# Patient Record
Sex: Male | Born: 1975 | Race: Black or African American | Hispanic: No | Marital: Single | Smoking: Never smoker
Health system: Southern US, Community
[De-identification: ages and names within clinical notes are randomized; demographics above are authoritative.]

## PROBLEM LIST (undated history)

## (undated) DIAGNOSIS — M1990 Unspecified osteoarthritis, unspecified site: Secondary | ICD-10-CM

## (undated) DIAGNOSIS — N390 Urinary tract infection, site not specified: Secondary | ICD-10-CM

## (undated) DIAGNOSIS — E559 Vitamin D deficiency, unspecified: Secondary | ICD-10-CM

## (undated) DIAGNOSIS — Q059 Spina bifida, unspecified: Secondary | ICD-10-CM

## (undated) DIAGNOSIS — I10 Essential (primary) hypertension: Secondary | ICD-10-CM

## (undated) DIAGNOSIS — E785 Hyperlipidemia, unspecified: Secondary | ICD-10-CM

## (undated) HISTORY — PX: SPINAL FIXATION SURGERY: SHX1055

## (undated) HISTORY — PX: HIP SURGERY: SHX245

## (undated) HISTORY — DX: Essential (primary) hypertension: I10

## (undated) HISTORY — DX: Vitamin D deficiency, unspecified: E55.9

## (undated) HISTORY — DX: Spina bifida, unspecified: Q05.9

## (undated) HISTORY — PX: FEMUR SURGERY: SHX943

## (undated) HISTORY — DX: Urinary tract infection, site not specified: N39.0

## (undated) HISTORY — PX: VENTRICULOPERITONEAL SHUNT: SHX204

## (undated) HISTORY — DX: Hyperlipidemia, unspecified: E78.5

---

## 2007-08-27 DIAGNOSIS — N390 Urinary tract infection, site not specified: Secondary | ICD-10-CM | POA: Insufficient documentation

## 2007-10-28 ENCOUNTER — Ambulatory Visit: Payer: Self-pay | Admitting: Specialist

## 2012-04-12 ENCOUNTER — Ambulatory Visit: Payer: Self-pay

## 2012-04-12 DIAGNOSIS — S72033A Displaced midcervical fracture of unspecified femur, initial encounter for closed fracture: Secondary | ICD-10-CM | POA: Diagnosis not present

## 2012-04-13 DIAGNOSIS — S72009A Fracture of unspecified part of neck of unspecified femur, initial encounter for closed fracture: Secondary | ICD-10-CM | POA: Diagnosis not present

## 2012-04-13 DIAGNOSIS — Q059 Spina bifida, unspecified: Secondary | ICD-10-CM | POA: Diagnosis not present

## 2012-04-13 DIAGNOSIS — W108XXA Fall (on) (from) other stairs and steps, initial encounter: Secondary | ICD-10-CM | POA: Diagnosis not present

## 2012-04-13 DIAGNOSIS — S72033A Displaced midcervical fracture of unspecified femur, initial encounter for closed fracture: Secondary | ICD-10-CM | POA: Diagnosis not present

## 2012-04-13 DIAGNOSIS — S79929A Unspecified injury of unspecified thigh, initial encounter: Secondary | ICD-10-CM | POA: Diagnosis not present

## 2012-04-13 DIAGNOSIS — M7989 Other specified soft tissue disorders: Secondary | ICD-10-CM | POA: Diagnosis not present

## 2012-04-13 DIAGNOSIS — Z01810 Encounter for preprocedural cardiovascular examination: Secondary | ICD-10-CM | POA: Diagnosis not present

## 2012-04-13 DIAGNOSIS — Z01818 Encounter for other preprocedural examination: Secondary | ICD-10-CM | POA: Diagnosis not present

## 2012-04-14 DIAGNOSIS — W108XXA Fall (on) (from) other stairs and steps, initial encounter: Secondary | ICD-10-CM | POA: Diagnosis not present

## 2012-04-14 DIAGNOSIS — N319 Neuromuscular dysfunction of bladder, unspecified: Secondary | ICD-10-CM | POA: Diagnosis not present

## 2012-04-14 DIAGNOSIS — I10 Essential (primary) hypertension: Secondary | ICD-10-CM | POA: Diagnosis not present

## 2012-04-14 DIAGNOSIS — B964 Proteus (mirabilis) (morganii) as the cause of diseases classified elsewhere: Secondary | ICD-10-CM | POA: Diagnosis not present

## 2012-04-14 DIAGNOSIS — IMO0002 Reserved for concepts with insufficient information to code with codable children: Secondary | ICD-10-CM | POA: Diagnosis not present

## 2012-04-14 DIAGNOSIS — Q059 Spina bifida, unspecified: Secondary | ICD-10-CM | POA: Diagnosis not present

## 2012-04-14 DIAGNOSIS — N390 Urinary tract infection, site not specified: Secondary | ICD-10-CM | POA: Diagnosis not present

## 2012-04-14 DIAGNOSIS — S72009A Fracture of unspecified part of neck of unspecified femur, initial encounter for closed fracture: Secondary | ICD-10-CM | POA: Diagnosis not present

## 2012-04-14 DIAGNOSIS — Z01810 Encounter for preprocedural cardiovascular examination: Secondary | ICD-10-CM | POA: Diagnosis not present

## 2012-04-18 DIAGNOSIS — Z466 Encounter for fitting and adjustment of urinary device: Secondary | ICD-10-CM | POA: Diagnosis not present

## 2012-04-18 DIAGNOSIS — G8911 Acute pain due to trauma: Secondary | ICD-10-CM | POA: Diagnosis not present

## 2012-04-18 DIAGNOSIS — Z792 Long term (current) use of antibiotics: Secondary | ICD-10-CM | POA: Diagnosis not present

## 2012-04-18 DIAGNOSIS — Z5189 Encounter for other specified aftercare: Secondary | ICD-10-CM | POA: Diagnosis not present

## 2012-04-18 DIAGNOSIS — S72009D Fracture of unspecified part of neck of unspecified femur, subsequent encounter for closed fracture with routine healing: Secondary | ICD-10-CM | POA: Diagnosis not present

## 2012-04-18 DIAGNOSIS — I10 Essential (primary) hypertension: Secondary | ICD-10-CM | POA: Diagnosis not present

## 2012-04-18 DIAGNOSIS — N319 Neuromuscular dysfunction of bladder, unspecified: Secondary | ICD-10-CM | POA: Diagnosis not present

## 2012-04-18 DIAGNOSIS — W108XXA Fall (on) (from) other stairs and steps, initial encounter: Secondary | ICD-10-CM | POA: Diagnosis not present

## 2012-04-18 DIAGNOSIS — N390 Urinary tract infection, site not specified: Secondary | ICD-10-CM | POA: Diagnosis not present

## 2012-04-18 DIAGNOSIS — F329 Major depressive disorder, single episode, unspecified: Secondary | ICD-10-CM | POA: Diagnosis not present

## 2012-04-18 DIAGNOSIS — Q059 Spina bifida, unspecified: Secondary | ICD-10-CM | POA: Diagnosis not present

## 2012-04-18 DIAGNOSIS — F411 Generalized anxiety disorder: Secondary | ICD-10-CM | POA: Diagnosis not present

## 2012-04-20 DIAGNOSIS — G8911 Acute pain due to trauma: Secondary | ICD-10-CM | POA: Diagnosis not present

## 2012-04-20 DIAGNOSIS — Z5189 Encounter for other specified aftercare: Secondary | ICD-10-CM | POA: Diagnosis not present

## 2012-04-20 DIAGNOSIS — Q059 Spina bifida, unspecified: Secondary | ICD-10-CM | POA: Diagnosis not present

## 2012-04-20 DIAGNOSIS — N390 Urinary tract infection, site not specified: Secondary | ICD-10-CM | POA: Diagnosis not present

## 2012-04-20 DIAGNOSIS — S72009D Fracture of unspecified part of neck of unspecified femur, subsequent encounter for closed fracture with routine healing: Secondary | ICD-10-CM | POA: Diagnosis not present

## 2012-04-20 DIAGNOSIS — I10 Essential (primary) hypertension: Secondary | ICD-10-CM | POA: Diagnosis not present

## 2012-04-22 DIAGNOSIS — I10 Essential (primary) hypertension: Secondary | ICD-10-CM | POA: Diagnosis not present

## 2012-04-22 DIAGNOSIS — S72009D Fracture of unspecified part of neck of unspecified femur, subsequent encounter for closed fracture with routine healing: Secondary | ICD-10-CM | POA: Diagnosis not present

## 2012-04-22 DIAGNOSIS — N390 Urinary tract infection, site not specified: Secondary | ICD-10-CM | POA: Diagnosis not present

## 2012-04-22 DIAGNOSIS — Q059 Spina bifida, unspecified: Secondary | ICD-10-CM | POA: Diagnosis not present

## 2012-04-22 DIAGNOSIS — G8911 Acute pain due to trauma: Secondary | ICD-10-CM | POA: Diagnosis not present

## 2012-04-22 DIAGNOSIS — Z5189 Encounter for other specified aftercare: Secondary | ICD-10-CM | POA: Diagnosis not present

## 2012-04-24 DIAGNOSIS — Z5189 Encounter for other specified aftercare: Secondary | ICD-10-CM | POA: Diagnosis not present

## 2012-04-24 DIAGNOSIS — G8911 Acute pain due to trauma: Secondary | ICD-10-CM | POA: Diagnosis not present

## 2012-04-24 DIAGNOSIS — N390 Urinary tract infection, site not specified: Secondary | ICD-10-CM | POA: Diagnosis not present

## 2012-04-24 DIAGNOSIS — Q059 Spina bifida, unspecified: Secondary | ICD-10-CM | POA: Diagnosis not present

## 2012-04-24 DIAGNOSIS — I10 Essential (primary) hypertension: Secondary | ICD-10-CM | POA: Diagnosis not present

## 2012-04-24 DIAGNOSIS — S72009D Fracture of unspecified part of neck of unspecified femur, subsequent encounter for closed fracture with routine healing: Secondary | ICD-10-CM | POA: Diagnosis not present

## 2012-04-26 DIAGNOSIS — Q059 Spina bifida, unspecified: Secondary | ICD-10-CM | POA: Diagnosis not present

## 2012-04-26 DIAGNOSIS — S72009D Fracture of unspecified part of neck of unspecified femur, subsequent encounter for closed fracture with routine healing: Secondary | ICD-10-CM | POA: Diagnosis not present

## 2012-04-26 DIAGNOSIS — N390 Urinary tract infection, site not specified: Secondary | ICD-10-CM | POA: Diagnosis not present

## 2012-04-26 DIAGNOSIS — Z5189 Encounter for other specified aftercare: Secondary | ICD-10-CM | POA: Diagnosis not present

## 2012-04-26 DIAGNOSIS — I10 Essential (primary) hypertension: Secondary | ICD-10-CM | POA: Diagnosis not present

## 2012-04-26 DIAGNOSIS — G8911 Acute pain due to trauma: Secondary | ICD-10-CM | POA: Diagnosis not present

## 2012-04-27 DIAGNOSIS — G8911 Acute pain due to trauma: Secondary | ICD-10-CM | POA: Diagnosis not present

## 2012-04-27 DIAGNOSIS — S72009D Fracture of unspecified part of neck of unspecified femur, subsequent encounter for closed fracture with routine healing: Secondary | ICD-10-CM | POA: Diagnosis not present

## 2012-04-27 DIAGNOSIS — I10 Essential (primary) hypertension: Secondary | ICD-10-CM | POA: Diagnosis not present

## 2012-04-27 DIAGNOSIS — Q059 Spina bifida, unspecified: Secondary | ICD-10-CM | POA: Diagnosis not present

## 2012-04-27 DIAGNOSIS — N390 Urinary tract infection, site not specified: Secondary | ICD-10-CM | POA: Diagnosis not present

## 2012-04-27 DIAGNOSIS — Z5189 Encounter for other specified aftercare: Secondary | ICD-10-CM | POA: Diagnosis not present

## 2012-04-28 DIAGNOSIS — Z5189 Encounter for other specified aftercare: Secondary | ICD-10-CM | POA: Diagnosis not present

## 2012-04-28 DIAGNOSIS — N390 Urinary tract infection, site not specified: Secondary | ICD-10-CM | POA: Diagnosis not present

## 2012-04-28 DIAGNOSIS — G8911 Acute pain due to trauma: Secondary | ICD-10-CM | POA: Diagnosis not present

## 2012-04-28 DIAGNOSIS — Q059 Spina bifida, unspecified: Secondary | ICD-10-CM | POA: Diagnosis not present

## 2012-04-28 DIAGNOSIS — I10 Essential (primary) hypertension: Secondary | ICD-10-CM | POA: Diagnosis not present

## 2012-04-28 DIAGNOSIS — S72009D Fracture of unspecified part of neck of unspecified femur, subsequent encounter for closed fracture with routine healing: Secondary | ICD-10-CM | POA: Diagnosis not present

## 2012-04-29 DIAGNOSIS — N390 Urinary tract infection, site not specified: Secondary | ICD-10-CM | POA: Diagnosis not present

## 2012-04-29 DIAGNOSIS — N319 Neuromuscular dysfunction of bladder, unspecified: Secondary | ICD-10-CM | POA: Diagnosis not present

## 2012-04-29 DIAGNOSIS — G43109 Migraine with aura, not intractable, without status migrainosus: Secondary | ICD-10-CM | POA: Diagnosis not present

## 2012-04-29 DIAGNOSIS — I10 Essential (primary) hypertension: Secondary | ICD-10-CM | POA: Diagnosis not present

## 2012-04-30 DIAGNOSIS — G8911 Acute pain due to trauma: Secondary | ICD-10-CM | POA: Diagnosis not present

## 2012-04-30 DIAGNOSIS — N390 Urinary tract infection, site not specified: Secondary | ICD-10-CM | POA: Diagnosis not present

## 2012-04-30 DIAGNOSIS — Z5189 Encounter for other specified aftercare: Secondary | ICD-10-CM | POA: Diagnosis not present

## 2012-04-30 DIAGNOSIS — Q059 Spina bifida, unspecified: Secondary | ICD-10-CM | POA: Diagnosis not present

## 2012-04-30 DIAGNOSIS — S72009D Fracture of unspecified part of neck of unspecified femur, subsequent encounter for closed fracture with routine healing: Secondary | ICD-10-CM | POA: Diagnosis not present

## 2012-04-30 DIAGNOSIS — I10 Essential (primary) hypertension: Secondary | ICD-10-CM | POA: Diagnosis not present

## 2012-05-05 DIAGNOSIS — Z5189 Encounter for other specified aftercare: Secondary | ICD-10-CM | POA: Diagnosis not present

## 2012-05-06 DIAGNOSIS — G8911 Acute pain due to trauma: Secondary | ICD-10-CM | POA: Diagnosis not present

## 2012-05-06 DIAGNOSIS — N390 Urinary tract infection, site not specified: Secondary | ICD-10-CM | POA: Diagnosis not present

## 2012-05-06 DIAGNOSIS — Z5189 Encounter for other specified aftercare: Secondary | ICD-10-CM | POA: Diagnosis not present

## 2012-05-06 DIAGNOSIS — I10 Essential (primary) hypertension: Secondary | ICD-10-CM | POA: Diagnosis not present

## 2012-05-06 DIAGNOSIS — Q059 Spina bifida, unspecified: Secondary | ICD-10-CM | POA: Diagnosis not present

## 2012-05-06 DIAGNOSIS — S72009D Fracture of unspecified part of neck of unspecified femur, subsequent encounter for closed fracture with routine healing: Secondary | ICD-10-CM | POA: Diagnosis not present

## 2012-06-03 DIAGNOSIS — IMO0002 Reserved for concepts with insufficient information to code with codable children: Secondary | ICD-10-CM | POA: Diagnosis not present

## 2012-06-03 DIAGNOSIS — S72033A Displaced midcervical fracture of unspecified femur, initial encounter for closed fracture: Secondary | ICD-10-CM | POA: Diagnosis not present

## 2012-06-03 DIAGNOSIS — E559 Vitamin D deficiency, unspecified: Secondary | ICD-10-CM | POA: Diagnosis not present

## 2012-06-03 DIAGNOSIS — IMO0001 Reserved for inherently not codable concepts without codable children: Secondary | ICD-10-CM | POA: Diagnosis not present

## 2012-06-03 DIAGNOSIS — I10 Essential (primary) hypertension: Secondary | ICD-10-CM | POA: Diagnosis not present

## 2012-06-03 DIAGNOSIS — N390 Urinary tract infection, site not specified: Secondary | ICD-10-CM | POA: Diagnosis not present

## 2012-11-01 DIAGNOSIS — R109 Unspecified abdominal pain: Secondary | ICD-10-CM | POA: Diagnosis not present

## 2012-11-01 DIAGNOSIS — I10 Essential (primary) hypertension: Secondary | ICD-10-CM | POA: Diagnosis not present

## 2012-11-26 ENCOUNTER — Ambulatory Visit: Payer: Self-pay

## 2012-11-26 DIAGNOSIS — R319 Hematuria, unspecified: Secondary | ICD-10-CM | POA: Diagnosis not present

## 2012-12-06 DIAGNOSIS — R3129 Other microscopic hematuria: Secondary | ICD-10-CM | POA: Diagnosis not present

## 2012-12-06 DIAGNOSIS — N329 Bladder disorder, unspecified: Secondary | ICD-10-CM | POA: Diagnosis not present

## 2012-12-06 DIAGNOSIS — E291 Testicular hypofunction: Secondary | ICD-10-CM | POA: Diagnosis not present

## 2012-12-17 DIAGNOSIS — R82998 Other abnormal findings in urine: Secondary | ICD-10-CM | POA: Diagnosis not present

## 2012-12-17 DIAGNOSIS — N319 Neuromuscular dysfunction of bladder, unspecified: Secondary | ICD-10-CM | POA: Diagnosis not present

## 2012-12-17 DIAGNOSIS — R319 Hematuria, unspecified: Secondary | ICD-10-CM | POA: Diagnosis not present

## 2012-12-17 DIAGNOSIS — R32 Unspecified urinary incontinence: Secondary | ICD-10-CM | POA: Diagnosis not present

## 2013-06-20 DIAGNOSIS — N400 Enlarged prostate without lower urinary tract symptoms: Secondary | ICD-10-CM | POA: Diagnosis not present

## 2013-06-20 DIAGNOSIS — R3129 Other microscopic hematuria: Secondary | ICD-10-CM | POA: Diagnosis not present

## 2013-06-20 DIAGNOSIS — N390 Urinary tract infection, site not specified: Secondary | ICD-10-CM | POA: Diagnosis not present

## 2013-06-20 DIAGNOSIS — N319 Neuromuscular dysfunction of bladder, unspecified: Secondary | ICD-10-CM | POA: Diagnosis not present

## 2013-07-29 DIAGNOSIS — N319 Neuromuscular dysfunction of bladder, unspecified: Secondary | ICD-10-CM | POA: Diagnosis not present

## 2013-07-29 DIAGNOSIS — N390 Urinary tract infection, site not specified: Secondary | ICD-10-CM | POA: Diagnosis not present

## 2013-07-29 DIAGNOSIS — I10 Essential (primary) hypertension: Secondary | ICD-10-CM | POA: Diagnosis not present

## 2013-07-29 DIAGNOSIS — R197 Diarrhea, unspecified: Secondary | ICD-10-CM | POA: Diagnosis not present

## 2014-03-02 DIAGNOSIS — I10 Essential (primary) hypertension: Secondary | ICD-10-CM | POA: Diagnosis not present

## 2014-03-02 DIAGNOSIS — E860 Dehydration: Secondary | ICD-10-CM | POA: Diagnosis not present

## 2014-03-02 DIAGNOSIS — N390 Urinary tract infection, site not specified: Secondary | ICD-10-CM | POA: Diagnosis not present

## 2014-03-02 DIAGNOSIS — R197 Diarrhea, unspecified: Secondary | ICD-10-CM | POA: Diagnosis not present

## 2014-03-21 DIAGNOSIS — I10 Essential (primary) hypertension: Secondary | ICD-10-CM | POA: Diagnosis not present

## 2014-03-21 DIAGNOSIS — N390 Urinary tract infection, site not specified: Secondary | ICD-10-CM | POA: Diagnosis not present

## 2014-10-02 IMAGING — CT CT ABDOMEN AND PELVIS WITHOUT AND WITH CONTRAST
2 of 4 series · 13 of 32 positions shown, 18 images · non-contrast
Comparison: none

REASON FOR EXAM: hematuria
COMMENTS:

[Series 2: wo 3.0 i40f 3 · axial · 0.82mm/px · z∈[-1128,-771]mm · 8 of 154 slices shown, 13 images]
[im 18/154  soft-tissue]
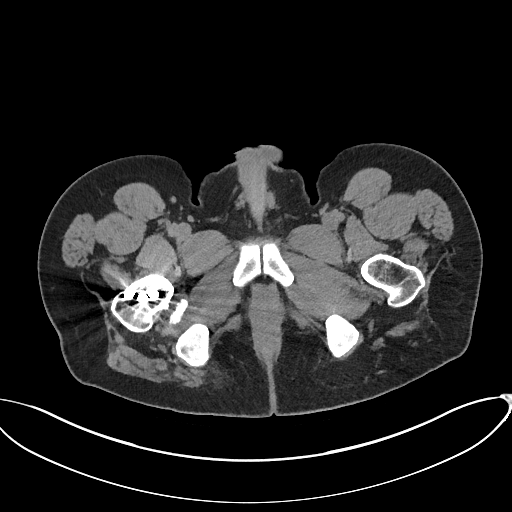
[im 18/154  bone]
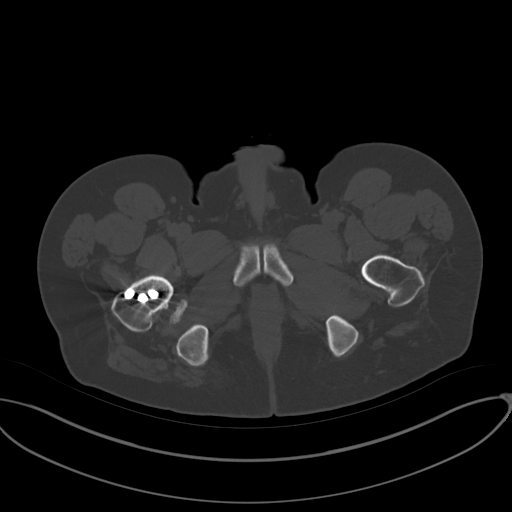
[im 35/154  soft-tissue]
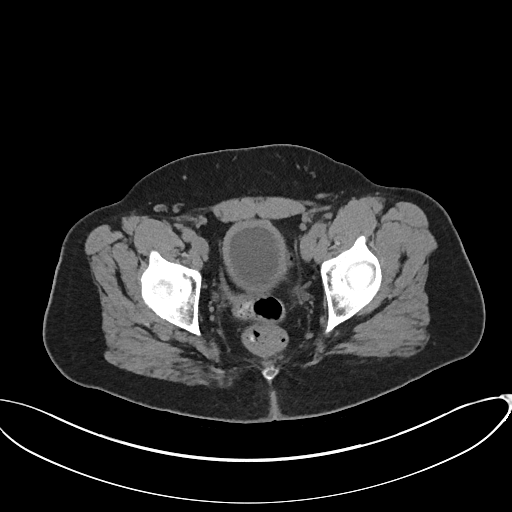
[im 52/154  soft-tissue]
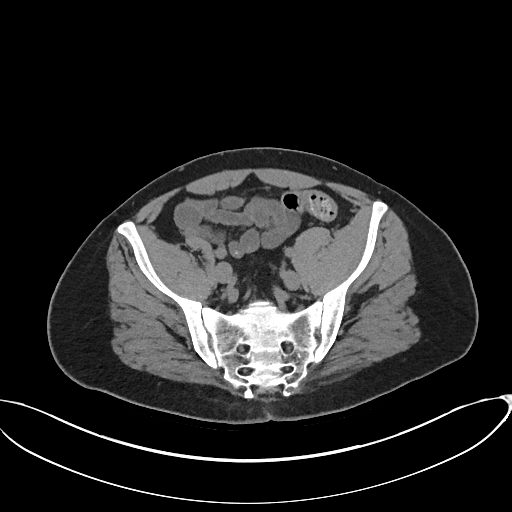
[im 69/154  soft-tissue]
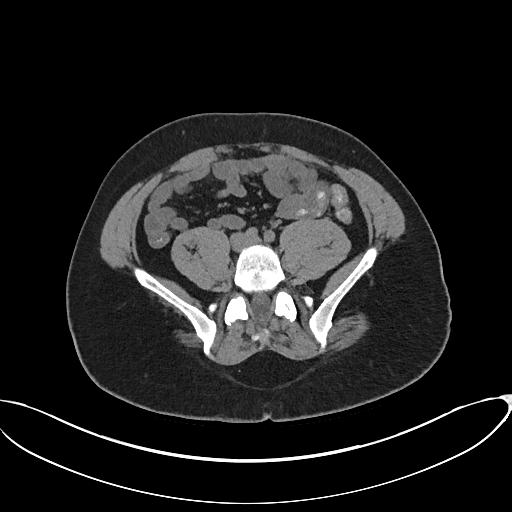
[im 86/154  soft-tissue]
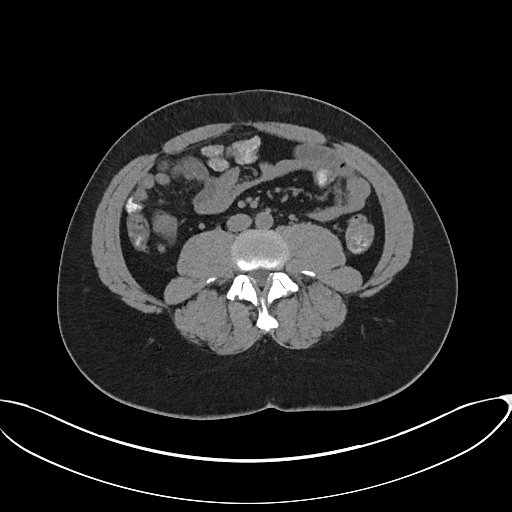
[im 86/154  lung]
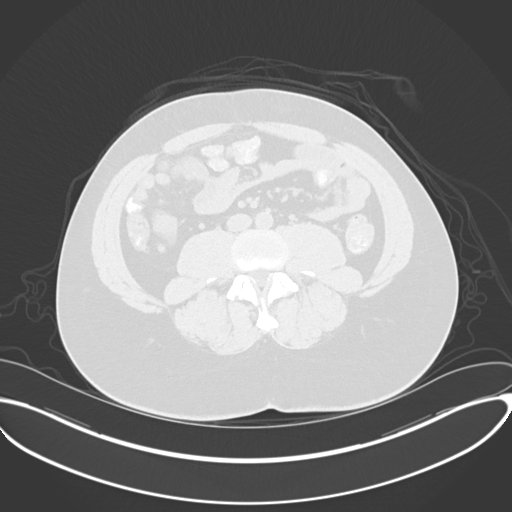
[im 103/154  soft-tissue]
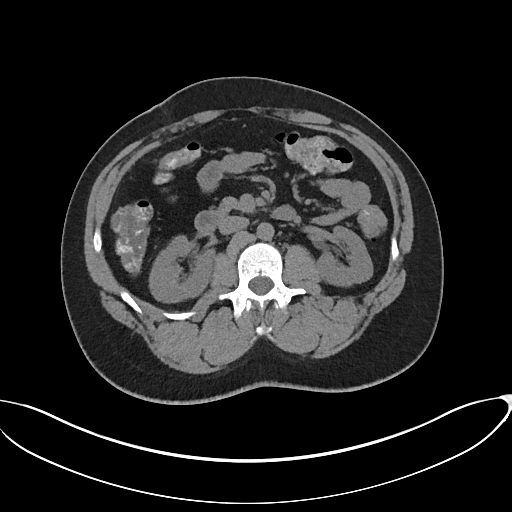
[im 103/154  lung]
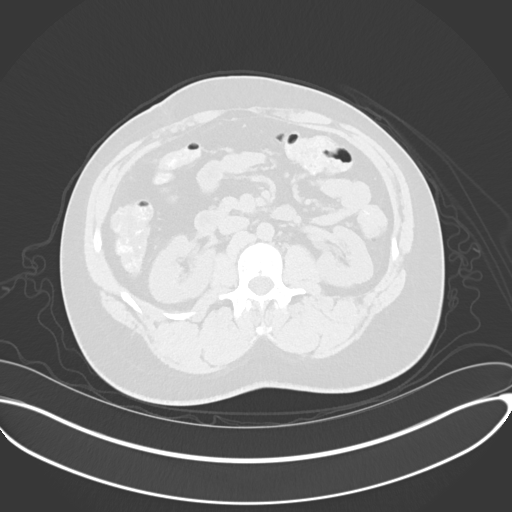
[im 120/154  soft-tissue]
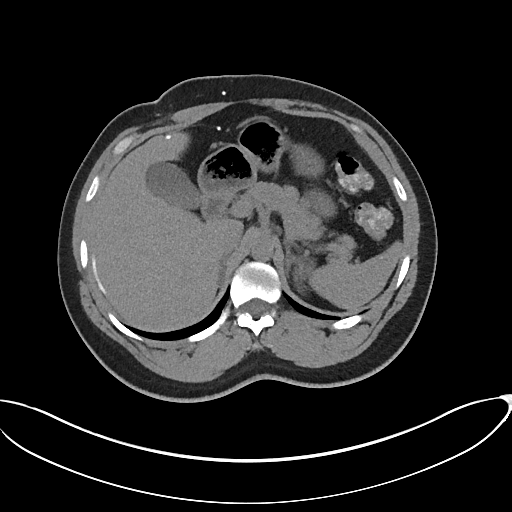
[im 120/154  lung]
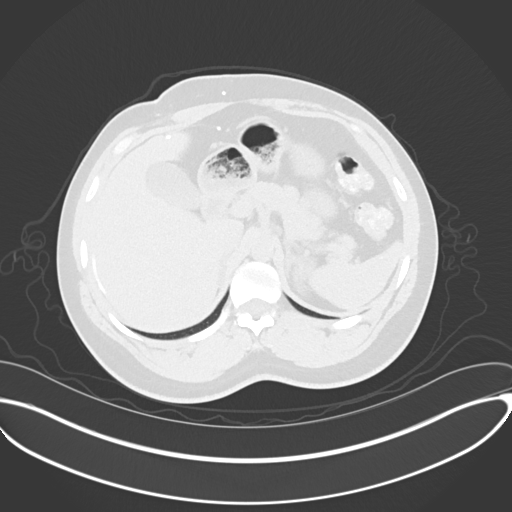
[im 137/154  soft-tissue]
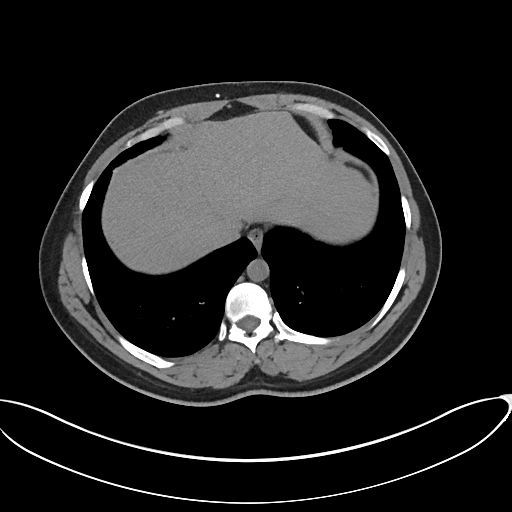
[im 137/154  lung]
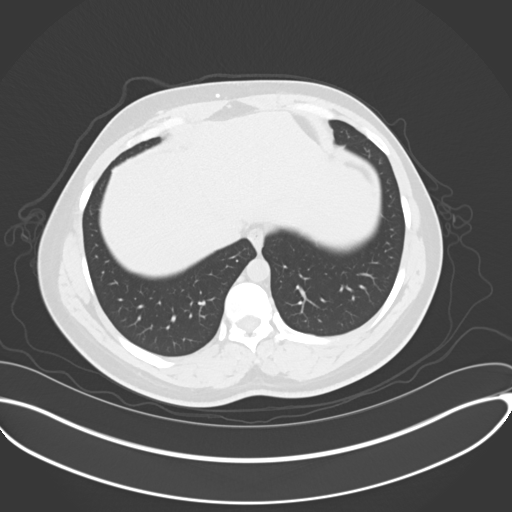

[Series 4: with 3.0 i40f 3 · axial · 0.82mm/px · z∈[-1128,-924]mm · 5 of 154 slices shown]
[im 18/154  soft-tissue]
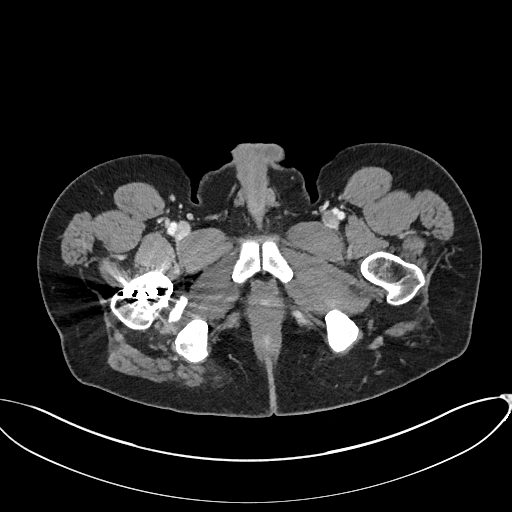
[im 35/154  soft-tissue]
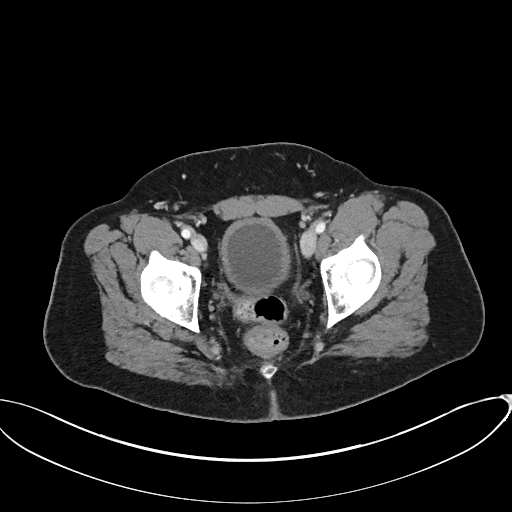
[im 52/154  soft-tissue]
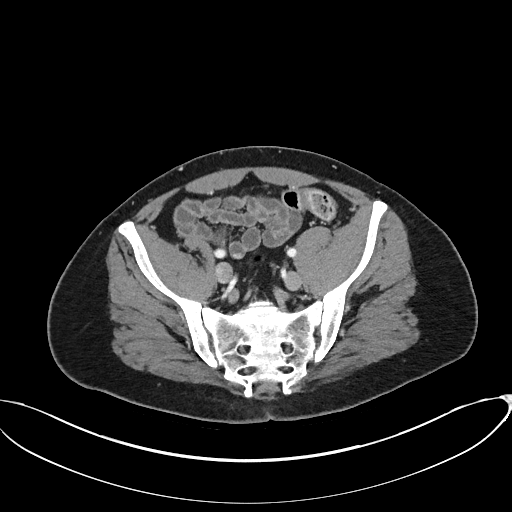
[im 69/154  soft-tissue]
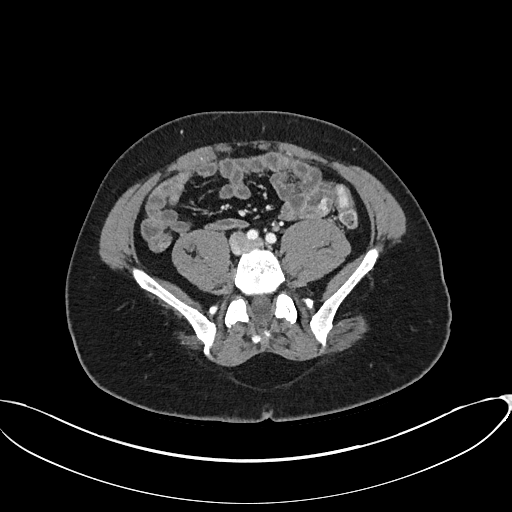
[im 86/154  soft-tissue]
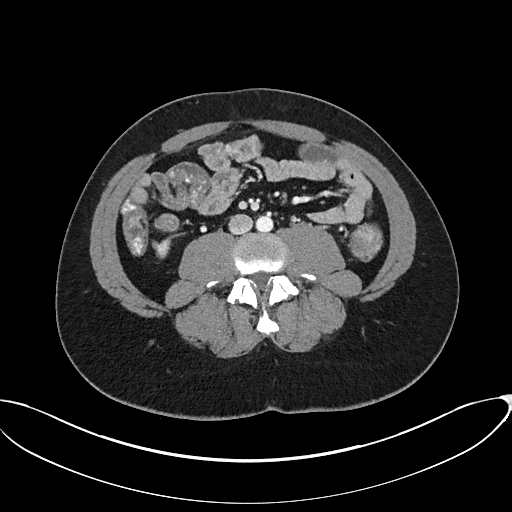

[13 of 32 positions shown; findings below may reference images not displayed]

PROCEDURE:     KCT - KCT ABDOMEN/PELVIS W/WO  - November 26, 2012 [DATE]

RESULT:     A triphasic CT scan was performed through the abdomen and pelvis
with reconstructions at 3 mm intervals and slice thicknesses. For the
postcontrast images the patient received 85 cc of Isovue 370.

On the noncontrast images the kidneys are normal in density and contour. No
calcifications are demonstrated. No perinephric inflammatory changes are
seen. Following contrast administration the enhancement pattern of the renal
parenchyma is normal. On delayed images contrast within the renal collecting
systems is normal. There is fractional visualization of the ureters. The
partially distended urinary bladder is grossly normal although there is
subjective very mild wall thickening. The patient may have undergone
previous TURP. The prostate gland is mildly enlarged.

The psoas musculature is normal in density and contour with no findings to
suggest an abscess. The liver exhibits mildly decreased density suggesting
fatty infiltration. The gallbladder is adequately distended with no evidence
of stones or wall thickening or pericholecystic fluid. The pancreas, spleen,
partially distended stomach, and adrenal glands are normal in appearance.
The caliber of the abdominal aorta is normal. The unopacified loops of small
and large bowel exhibit no acute abnormality. A normal calibered uninflamed
appendix is demonstrated.

The lung bases are clear. The lumbar vertebral bodies are preserved in
height. The patient has undergone previous beginning of the right femoral
head and neck for prior fracture.
IMPRESSION: 1. There is no evidence of urinary tract stones nor obstruction. No ureteral
or renal masses are demonstrated.
2. The urinary bladder is partially distended. Its wall is subjectively
mildly thickened. A similar finding was described on a CT scan in 3882.
There is irregularity of the opening of the prostatic urethra which may
reflect previous instrumentation. There is no evidence of prostatitis.
3. There is no acute hepatobiliary abnormality nor acute bowel abnormality.

[REDACTED]

## 2015-05-09 ENCOUNTER — Ambulatory Visit (INDEPENDENT_AMBULATORY_CARE_PROVIDER_SITE_OTHER): Payer: Medicare Other | Admitting: Family Medicine

## 2015-05-09 ENCOUNTER — Encounter: Payer: Self-pay | Admitting: Family Medicine

## 2015-05-09 VITALS — BP 128/88 | HR 134 | Temp 98.8°F | Resp 16 | Ht 63.0 in | Wt 170.1 lb

## 2015-05-09 DIAGNOSIS — I10 Essential (primary) hypertension: Secondary | ICD-10-CM

## 2015-05-09 DIAGNOSIS — N390 Urinary tract infection, site not specified: Secondary | ICD-10-CM | POA: Diagnosis not present

## 2015-05-09 DIAGNOSIS — R319 Hematuria, unspecified: Secondary | ICD-10-CM

## 2015-05-09 DIAGNOSIS — Z23 Encounter for immunization: Secondary | ICD-10-CM

## 2015-05-09 DIAGNOSIS — R61 Generalized hyperhidrosis: Secondary | ICD-10-CM | POA: Diagnosis not present

## 2015-05-09 LAB — POCT URINALYSIS DIPSTICK
Bilirubin, UA: NEGATIVE
Glucose, UA: NEGATIVE
Nitrite, UA: NEGATIVE
PH UA: 5
PROTEIN UA: NEGATIVE
RBC UA: POSITIVE
SPEC GRAV UA: 1.01
UROBILINOGEN UA: NEGATIVE

## 2015-05-09 MED ORDER — LEVOFLOXACIN 500 MG PO TABS: 500.0000 mg | ORAL_TABLET | Freq: Every day | ORAL | Status: DC

## 2015-05-09 MED ORDER — LISINOPRIL-HYDROCHLOROTHIAZIDE 20-12.5 MG PO TABS: 1.0000 | ORAL_TABLET | Freq: Every day | ORAL | Status: DC

## 2015-05-09 NOTE — Progress Notes (Signed)
Name: Juan Hodges   MRN: [id number]    DOB: Jan 17, 1976   Date:05/09/2015       Progress Note  Subjective  Chief Complaint  Chief Complaint  Patient presents with  . Cystitis    possible bladder infection    HPI  UTI  Patient presents with a several day history of dysuria frequency and urgency.  There has been suprapubic and lower back discomfort.  No fever or chills noted.  There is been no hematuria. There has  a past history of recurrent UTIs. 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 He had surgical procedure in infancy. He continues to have urinary incontinence and has to do self catheterizations 7 times per day. In the past she has been on suppressive antibiotics. Currently is on antibiotics on with flare.  Hypertension   Patient presents for follow-up of hypertension. It has been present for over 5years.  Patient states that there is compliance with medical regimen which consists of lisinopril HCTZThere is no end organ disease. Cardiac risk factors include hypertension hyperlipidemia and diabetes.  Exercise regimen consist of mental because of his spina bifida  Diet consist of some sodium restriction.    Excessive sweating  Patient complaining of several month history of excessive sweating. There's been no cough or chills and no weight loss.  Past Medical History  Diagnosis Date  . Hypertension   . Chronic UTI (urinary tract infection)     Social History  Substance Use Topics  . Smoking status: Never Smoker   . Smokeless tobacco: Not on file  . Alcohol Use: No     Current outpatient prescriptions:  .  lisinopril-hydrochlorothiazide (PRINZIDE,ZESTORETIC) 20-12.5 MG per tablet, Take 1 tablet by mouth daily., Disp: , Rfl:   No Known Allergies  Review of Systems  Constitutional: Negative for fever, chills and weight loss.  HENT: Negative for congestion, hearing loss, sore throat and  tinnitus.   Eyes: Negative for blurred vision, double vision and redness.  Respiratory: Negative for cough, hemoptysis and shortness of breath.   Cardiovascular: Negative for chest pain, palpitations, orthopnea, claudication and leg swelling.  Gastrointestinal: Negative for heartburn, nausea, vomiting, diarrhea, constipation and blood in stool.  Genitourinary: Positive for dysuria, urgency and frequency. Negative for hematuria.  Musculoskeletal: Negative for myalgias, back pain, joint pain, falls and neck pain.  Skin: Negative for itching.  Neurological: Negative for dizziness, tingling, tremors, focal weakness, seizures, loss of consciousness, weakness and headaches.  Endo/Heme/Allergies: Does not bruise/bleed easily.       Excessive sweating  Psychiatric/Behavioral: Negative for depression and substance abuse. The patient is not nervous/anxious and does not have insomnia.      Objective  Filed Vitals:   05/09/15 1036  BP: 128/88  Pulse: 134  Temp: 98.8 F (37.1 C)  Resp: 16  Height: 5\' 3"  (1.6 m)  Weight: 170 lb 1 oz (77.14 kg)  SpO2: 96%     Physical Exam  Constitutional: He is well-developed, well-nourished, and in no distress.  Modestly obese  HENT:  Head: Normocephalic.  Eyes: EOM are normal. Pupils are equal, round, and reactive to light.  Neck: Normal range of motion. Neck supple. No thyromegaly present.  Cardiovascular: Normal rate, regular rhythm and normal heart sounds.   No murmur heard. Pulmonary/Chest: Effort normal and breath sounds normal. No respiratory distress. He has no wheezes.  Abdominal: Soft. Bowel sounds are normal.  Musculoskeletal: He exhibits no  edema.  Lymphadenopathy:    He has no cervical adenopathy.  Neurological: No cranial nerve deficit. Coordination normal.  Antalgic gait.  Old surgical scar over the lower lumbar sacral area from spina bifida surgery he is somewhat spastic with his movements  Skin: Skin is warm and dry. No rash noted.   Psychiatric: Affect and judgment normal.      Assessment & Plan  1. Essential hypertension Well-controlled - Comprehensive Metabolic Panel (CMET)  2. UTI (lower urinary tract infection) Treatment levofloxacin - POCT urinalysis dipstick - levofloxacin (LEVAQUIN) 500 MG tablet; Take 1 tablet (500 mg total) by mouth daily.  Dispense: 10 tablet; Refill: 0  3. Need for influenza vaccination Refused  4. Excessive sweating  - CBC - TSH  5. Hematuria  - Urine culture

## 2015-05-10 LAB — TSH: TSH: 1.68 u[IU]/mL (ref 0.450–4.500)

## 2015-05-10 LAB — COMPREHENSIVE METABOLIC PANEL
ALT: 16 IU/L (ref 0–44)
AST: 17 IU/L (ref 0–40)
Albumin/Globulin Ratio: 1.5 (ref 1.1–2.5)
Albumin: 4.8 g/dL (ref 3.5–5.5)
Alkaline Phosphatase: 110 IU/L (ref 39–117)
BUN/Creatinine Ratio: 12 (ref 8–19)
BUN: 13 mg/dL (ref 6–20)
Bilirubin Total: 0.6 mg/dL (ref 0.0–1.2)
CALCIUM: 10.4 mg/dL — AB (ref 8.7–10.2)
CO2: 24 mmol/L (ref 18–29)
CREATININE: 1.05 mg/dL (ref 0.76–1.27)
Chloride: 98 mmol/L (ref 97–108)
GFR calc Af Amer: 103 mL/min/{1.73_m2} (ref >59)
GFR, EST NON AFRICAN AMERICAN: 89 mL/min/{1.73_m2} (ref >59)
Globulin, Total: 3.2 g/dL (ref 1.5–4.5)
Glucose: 112 mg/dL — ABNORMAL HIGH (ref 65–99)
Potassium: 4.2 mmol/L (ref 3.5–5.2)
Sodium: 141 mmol/L (ref 134–144)
Total Protein: 8 g/dL (ref 6.0–8.5)

## 2015-05-10 LAB — CBC
Hematocrit: 49.8 % (ref 37.5–51.0)
Hemoglobin: 16.9 g/dL (ref 12.6–17.7)
MCH: 28.9 pg (ref 26.6–33.0)
MCHC: 33.9 g/dL (ref 31.5–35.7)
MCV: 85 fL (ref 79–97)
PLATELETS: 309 10*3/uL (ref 150–379)
RBC: 5.85 x10E6/uL — ABNORMAL HIGH (ref 4.14–5.80)
RDW: 13.6 % (ref 12.3–15.4)
WBC: 8.5 10*3/uL (ref 3.4–10.8)

## 2015-05-10 LAB — URINE CULTURE: ORGANISM ID, BACTERIA: NO GROWTH

## 2015-05-18 ENCOUNTER — Telehealth: Payer: Self-pay | Admitting: Family Medicine

## 2015-05-18 NOTE — Telephone Encounter (Signed)
LABS RESULTS NEEDED

## 2015-05-21 NOTE — Telephone Encounter (Signed)
Pt aware of results 

## 2015-10-05 ENCOUNTER — Telehealth: Payer: Self-pay | Admitting: Family Medicine

## 2015-10-05 DIAGNOSIS — N390 Urinary tract infection, site not specified: Secondary | ICD-10-CM

## 2015-10-05 NOTE — Telephone Encounter (Signed)
NEEDS REFILL ON ANTIOBOTIC ( LEVOFLOXACIN  1 DAILY. PHARM IS [facility]. MOTHER SAYS THAT THIS IS GIVEN TO HIM ALL THE TIME. PLEASE SEE IF ANOTHER DR CAN DO THIS RX. OFFERED HER APPT WITH [facility] THAT MORNING BUT SHE HAS ANOTHER APPT FOR HERSELF.

## 2015-10-08 ENCOUNTER — Ambulatory Visit: Payer: Medicare Other | Admitting: Family Medicine

## 2015-10-08 MED ORDER — LEVOFLOXACIN 500 MG PO TABS: 500.0000 mg | ORAL_TABLET | Freq: Every day | ORAL | Status: DC

## 2015-10-08 NOTE — Telephone Encounter (Signed)
Sent refill for what was written in pt chart which was for a 10 day supply.

## 2015-11-08 ENCOUNTER — Ambulatory Visit: Payer: Medicare Other | Admitting: Family Medicine

## 2015-11-20 ENCOUNTER — Other Ambulatory Visit: Payer: Self-pay | Admitting: Family Medicine

## 2015-12-06 ENCOUNTER — Ambulatory Visit: Payer: Medicare Other | Admitting: Family Medicine

## 2015-12-24 ENCOUNTER — Ambulatory Visit: Payer: Medicare Other | Admitting: Family Medicine

## 2015-12-29 ENCOUNTER — Other Ambulatory Visit: Payer: Self-pay | Admitting: Family Medicine

## 2016-01-01 ENCOUNTER — Ambulatory Visit (INDEPENDENT_AMBULATORY_CARE_PROVIDER_SITE_OTHER): Payer: Medicare Other | Admitting: Family Medicine

## 2016-01-01 ENCOUNTER — Encounter: Payer: Self-pay | Admitting: Family Medicine

## 2016-01-01 ENCOUNTER — Ambulatory Visit: Payer: Medicare Other | Admitting: Family Medicine

## 2016-01-01 ENCOUNTER — Other Ambulatory Visit: Payer: Self-pay | Admitting: Family Medicine

## 2016-01-01 VITALS — BP 132/88 | HR 120 | Temp 98.2°F | Resp 18 | Ht 63.0 in | Wt 171.4 lb

## 2016-01-01 DIAGNOSIS — Q057 Lumbar spina bifida without hydrocephalus: Secondary | ICD-10-CM | POA: Diagnosis not present

## 2016-01-01 DIAGNOSIS — I10 Essential (primary) hypertension: Secondary | ICD-10-CM | POA: Diagnosis not present

## 2016-01-01 DIAGNOSIS — E559 Vitamin D deficiency, unspecified: Secondary | ICD-10-CM | POA: Insufficient documentation

## 2016-01-01 DIAGNOSIS — G43009 Migraine without aura, not intractable, without status migrainosus: Secondary | ICD-10-CM | POA: Diagnosis not present

## 2016-01-01 DIAGNOSIS — Z8781 Personal history of (healed) traumatic fracture: Secondary | ICD-10-CM | POA: Insufficient documentation

## 2016-01-01 DIAGNOSIS — N390 Urinary tract infection, site not specified: Secondary | ICD-10-CM | POA: Diagnosis not present

## 2016-01-01 LAB — POCT URINALYSIS DIPSTICK
BILIRUBIN UA: NEGATIVE
Glucose, UA: NEGATIVE
KETONES UA: NEGATIVE
Nitrite, UA: NEGATIVE
PH UA: 7.5
Protein, UA: NEGATIVE
SPEC GRAV UA: 1.015
Urobilinogen, UA: 1

## 2016-01-01 MED ORDER — LISINOPRIL-HYDROCHLOROTHIAZIDE 20-12.5 MG PO TABS: 1.0000 | ORAL_TABLET | Freq: Every day | ORAL | Status: DC

## 2016-01-01 MED ORDER — NORTRIPTYLINE HCL 10 MG PO CAPS: 10.0000 mg | ORAL_CAPSULE | Freq: Every day | ORAL | Status: DC

## 2016-01-01 NOTE — Progress Notes (Signed)
Name: Juan Hodges   MRN: [id number]    DOB: June 22, 1976   Date:01/01/2016       Progress Note  Subjective  Chief Complaint  Chief Complaint  Patient presents with  . Urinary Tract Infection    HPI  HTN: he has been out of bp medication for the past couple of days, he will get prescription today. BP at home is 130's/80's. He had some tachycardia this am, but he states they had to rush here today. No chest pain or palpitation. No side effects of medication  Recurrent UTI: [facility] He denies any symptoms of UTI, but would like to be checked. No fever or back pain. He has neurogenic bladder secondary to spina bifida  Migraine: he states migraine happens about three times week. Pain is described as throbbing, behind eyes, associated with phonophobia at times, but denies nausea and vomiting. He used to take Topamax but he could not tolerate side effects. He takes prn otc medication during episodes. Discussed other medications and he is willing to try something else.   Patient Active Problem List   Diagnosis Date Noted  . H/O fracture of hip 01/01/2016  . Vitamin D deficiency 01/01/2016  . 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 Hypertension, benign 01/01/2016  . Neurogenic bladder 12/19/2009  . Migraine without aura and without status migrainosus, not intractable 11/09/2009  . Chronic urinary tract infection 08/27/2007    Past Surgical History  Procedure Laterality Date  . Spinal fixation surgery      Family History  Problem Relation Age of Onset  . Hyperlipidemia Mother   . Hypertension Mother     Social History   Social History  . Marital Status: Single    Spouse Name: N/A  . Number of Children: N/A  . Years of Education: N/A   Occupational History  . Not on file.   Social History Main Topics  . Smoking status: Never Smoker   . Smokeless tobacco: Not on file  . Alcohol Use: No  . Drug Use: No  . Sexual Activity:  Not on file   Other Topics Concern  . Not on file   Social History Narrative     Current outpatient prescriptions:  .  Cholecalciferol (D3-1000) 1000 units capsule, Take by mouth., Disp: , Rfl:  .  lisinopril-hydrochlorothiazide (PRINZIDE,ZESTORETIC) 20-12.5 MG tablet, Take 1 tablet by mouth daily., Disp: 30 tablet, Rfl: 5 .  nortriptyline (PAMELOR) 10 MG capsule, Take 1-2 capsules (10-20 mg total) by mouth at bedtime., Disp: 60 capsule, Rfl: 5  No Known Allergies   ROS  Constitutional: Negative for fever or weight change.  Respiratory: Negative for cough and shortness of breath.   Cardiovascular: Negative for chest pain or palpitations.  Gastrointestinal: Negative for abdominal pain, no bowel changes.  Musculoskeletal: Positive for gait problem or joint swelling.  Skin: Negative for rash.  Neurological: Negative for dizziness or headache.  No other specific complaints in a complete review of systems (except as listed in HPI above).  Objective  Filed Vitals:   01/01/16 1058  BP: 132/88  Pulse: 120  Temp: 98.2 F (36.8 C)  Resp: 18  Height: 5\' 3"  (1.6 m)  Weight: 171 lb 6 oz (77.735 kg)  SpO2: 97%    Body mass index is 30.37 kg/(m^2).  Physical Exam  Constitutional: Patient appears well-developed and well-nourished.  No distress.  HEENT: head atraumatic, normocephalic, pupils equal and reactive to light,  neck supple, throat  within normal limits Cardiovascular: Normal rate, regular rhythm and normal heart sounds.  No murmur heard. No BLE edema. Pulmonary/Chest: Effort normal and breath sounds normal. No respiratory distress. Abdominal: Soft.  There is no tenderness. Psychiatric: Patient has a normal mood and affect. behavior is normal. Judgment and thought content normal. Muscular Skeletal: wears braces because of spina bifida. Ankle braces  Recent Results (from the past 2160 hour(s))  POCT Urinalysis Dipstick     Status: Abnormal   Collection Time: 01/01/16  11:05 AM  Result Value Ref Range   Color, UA yellow    Clarity, UA clear    Glucose, UA neg    Bilirubin, UA neg    Ketones, UA neg    Spec Grav, UA 1.015    Blood, UA moderate    pH, UA 7.5    Protein, UA neg    Urobilinogen, UA 1.0    Nitrite, UA neg    Leukocytes, UA moderate (2+) (A) Negative     PHQ2/9: Depression screen [facility] 2/9 05/09/2015  Decreased Interest 0  Down, Depressed, Hopeless 0  PHQ - 2 Score 0    Fall Risk: Fall Risk  05/09/2015  Falls in the past year? No   Functional Status Survey: Is the patient deaf or have difficulty hearing?: No Does the patient have difficulty seeing, even when wearing glasses/contacts?: No Does the patient have difficulty concentrating, remembering, or making decisions?: Yes Does the patient have difficulty walking or climbing stairs?: Yes Does the patient have difficulty dressing or bathing?: No Does the patient have difficulty doing errands alone such as visiting a doctor's office or shopping?: Yes (does not drive, mother was here with him today)    Assessment & Plan  1. Recurrent UTI  - POCT Urinalysis Dipstick - Urine culture  2. 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 Hypertension, benign  - lisinopril-hydrochlorothiazide (PRINZIDE,ZESTORETIC) 20-12.5 MG tablet; Take 1 tablet by mouth daily.  Dispense: 30 tablet; Refill: 5  4. Vitamin D deficiency  Needs to resume otc supplementation  5. Migraine without aura and without status migrainosus, not intractable  He would like to wait 6 months before follow up, advised to return sooner if medication for prevention does not work for him - nortriptyline (PAMELOR) 10 MG capsule; Take 1-2 capsules (10-20 mg total) by mouth at bedtime.  Dispense: 60 capsule; Refill: 5

## 2016-01-04 ENCOUNTER — Other Ambulatory Visit: Payer: Self-pay | Admitting: Family Medicine

## 2016-01-04 LAB — URINE CULTURE

## 2016-01-04 MED ORDER — CIPROFLOXACIN HCL 250 MG PO TABS: 250.0000 mg | ORAL_TABLET | Freq: Two times a day (BID) | ORAL | Status: DC

## 2016-01-04 MED ORDER — NITROFURANTOIN MONOHYD MACRO 100 MG PO CAPS: 100.0000 mg | ORAL_CAPSULE | Freq: Two times a day (BID) | ORAL | Status: DC

## 2016-01-04 NOTE — Progress Notes (Signed)
Patient notified

## 2016-03-18 ENCOUNTER — Encounter: Payer: Self-pay | Admitting: Family Medicine

## 2016-03-18 ENCOUNTER — Ambulatory Visit (INDEPENDENT_AMBULATORY_CARE_PROVIDER_SITE_OTHER): Payer: Medicare Other | Admitting: Family Medicine

## 2016-03-18 VITALS — BP 144/92 | HR 120 | Temp 99.0°F | Resp 18 | Ht 64.0 in | Wt 170.6 lb

## 2016-03-18 DIAGNOSIS — L8990 Pressure ulcer of unspecified site, unspecified stage: Secondary | ICD-10-CM | POA: Diagnosis not present

## 2016-03-18 DIAGNOSIS — N390 Urinary tract infection, site not specified: Secondary | ICD-10-CM | POA: Diagnosis not present

## 2016-03-18 DIAGNOSIS — I10 Essential (primary) hypertension: Secondary | ICD-10-CM

## 2016-03-18 DIAGNOSIS — G43009 Migraine without aura, not intractable, without status migrainosus: Secondary | ICD-10-CM | POA: Diagnosis not present

## 2016-03-18 LAB — POCT URINALYSIS DIPSTICK
Bilirubin, UA: NEGATIVE
Glucose, UA: NEGATIVE
Ketones, UA: NEGATIVE
Nitrite, UA: NEGATIVE
PH UA: 5
PROTEIN UA: NEGATIVE
Spec Grav, UA: >=1.03
UROBILINOGEN UA: NEGATIVE

## 2016-03-18 MED ORDER — METOPROLOL SUCCINATE ER 50 MG PO TB24: 50.0000 mg | ORAL_TABLET | Freq: Every day | ORAL | Status: DC

## 2016-03-18 MED ORDER — NORTRIPTYLINE HCL 10 MG PO CAPS: 10.0000 mg | ORAL_CAPSULE | Freq: Every day | ORAL | Status: DC

## 2016-03-18 NOTE — Progress Notes (Signed)
Name: Juan Hodges   MRN: [id number]    DOB: November 18, 1975   Date:03/18/2016       Progress Note  Subjective  Chief Complaint  Chief Complaint  Patient presents with  . Follow-up    3 month F/U  . Recurrent UTI    Patient mother wants to have his urine rechecked and patient does not feel like his symptoms ever went away.   . Wound Check    Onset- Mother noticed wound on left outer ankle area last Sunday and states it could have been there awhile. Patient states it does not bother him but the wound has been draining-Clear and brown by a stain on his socks. 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     HPI  Pressure sore: he wears a brace on left lower leg, help with ambulation and over the weekend he noticed an open sore, mil  drainage but no  redness , no fever. He cannot say if it is painful secondary to lack of sensation caused by nerve damage/spina bifida.  Recurrent UTI: he was treated with antibiotics in April, mother states currently no urinary changes such as odor or change in color. He has been afebrile.We will recheck urine culture. He self cath about 4 times daily   HTN: bp very high today, he works part time at [employer], taking medication every night, heart rate also up, we will try adding Toprol and recheck in one month. No chest pain, no palpitation   Migraine headaches: he has been taking Nortriptyline every night and headaches at most 3 times weekly, it has improved with medication, he states he is just resting during episodes. Headache is described as frontal or nuchal, sometimes the entire head. Headache is described as pounding and associated with photophobia, phonophobia and seldom with nausea. Sometimes he takes otc Excedrin migraine prn    Patient Active Problem List   Diagnosis Date Noted  . H/O fracture of hip 01/01/2016  . Vitamin D deficiency 01/01/2016  . Spina bifida aperta of  lumbar spine (HCC) 01/01/2016  . Hypertension, benign 01/01/2016  . Neurogenic bladder 12/19/2009  . Migraine without aura and without status migrainosus, not intractable 11/09/2009  . Chronic urinary tract infection 08/27/2007    Past Surgical History  Procedure Laterality Date  . Spinal fixation surgery      Family History  Problem Relation Age of Onset  . Hyperlipidemia Mother   . Hypertension Mother     Social History   Social History  . Marital Status: Single    Spouse Name: N/A  . Number of Children: N/A  . Years of Education: N/A   Occupational History  . Not on file.   Social History Main Topics  . Smoking status: Never Smoker   . Smokeless tobacco: Not on file  . Alcohol Use: No  . Drug Use: No  . Sexual Activity: Not on file   Other Topics Concern  . Not on file   Social History Narrative     Current outpatient prescriptions:  .  Cholecalciferol (D3-1000) 1000 units capsule, Take by mouth., Disp: , Rfl:  .  lisinopril-hydrochlorothiazide (PRINZIDE,ZESTORETIC) 20-12.5 MG tablet, Take 1 tablet by mouth daily., Disp: 30 tablet, Rfl: 5 .  metoprolol succinate (TOPROL-XL) 50 MG 24 hr tablet, Take 1 tablet (50 mg total) by mouth daily. Take with or immediately following a meal., Disp: 30 tablet, Rfl:  0 .  nortriptyline (PAMELOR) 10 MG capsule, Take 1-2 capsules (10-20 mg total) by mouth at bedtime., Disp: 60 capsule, Rfl: 5  No Known Allergies   ROS  Ten systems reviewed and is negative except as mentioned in HPI   Objective  Filed Vitals:   03/18/16 1438 03/18/16 1453  BP: 158/108 144/92  Pulse: 120   Temp: 99 F (37.2 C)   TempSrc: Oral   Resp: 18   Height: 5\' 4"  (1.626 m)   Weight: 170 lb 9.6 oz (77.384 kg)   SpO2: 98%     Body mass index is 29.27 kg/(m^2).  Physical Exam  Constitutional: Patient appears well-developed and well-nourished. No distress.  HEENT: head atraumatic, normocephalic, pupils equal and reactive to light, neck  supple, throat within normal limits Cardiovascular: Normal rate, regular rhythm and normal heart sounds. No murmur heard. No BLE edema. Pulmonary/Chest: Effort normal and breath sounds normal. No respiratory distress. Abdominal: Soft. There is no tenderness. Psychiatric: Patient has a normal mood and affect. behavior is normal. Judgment and thought content normal. Muscular Skeletal: wears braces because of spina bifida. Ankle braces Neurological: decrease sensation of lower extremities Skin: small open wound on left lateral lower leg, dime size area, deeper in the center   Recent Results (from the past 2160 hour(s))  POCT Urinalysis Dipstick     Status: Abnormal   Collection Time: 01/01/16 11:05 AM  Result Value Ref Range   Color, UA yellow    Clarity, UA clear    Glucose, UA neg    Bilirubin, UA neg    Ketones, UA neg    Spec Grav, UA 1.015    Blood, UA moderate    pH, UA 7.5    Protein, UA neg    Urobilinogen, UA 1.0    Nitrite, UA neg    Leukocytes, UA moderate (2+) (A) Negative  Urine culture     Status: Abnormal   Collection Time: 01/01/16 11:49 AM  Result Value Ref Range   Urine Culture, Routine Final report (A)    Urine Culture result 1 Comment (A)     Comment: Staphylococcus epidermidis Greater than 100,000 colony forming units per mL Based on resistance to oxacillin this isolate would be resistant to all currently available beta-lactam antimicrobial agents, with the exception of the newer cephalosporins with anti-MRSA activity, such as Ceftaroline    ANTIMICROBIAL SUSCEPTIBILITY Comment     Comment:       ** S = Susceptible; I = Intermediate; R = Resistant **                    P = Positive; N = Negative             MICS are expressed in micrograms per mL    Antibiotic                 RSLT#1    RSLT#2    RSLT#3    RSLT#4 Ciprofloxacin                  R Gentamicin                     S Levofloxacin                   R Linezolid                       S Nitrofurantoin  S Oxacillin                      R Penicillin                     R Quinupristin/Dalfopristin      S Rifampin                       S Tetracycline                   S Trimethoprim/Sulfa             R Vancomycin                     S   POCT Urinalysis Dipstick     Status: Abnormal   Collection Time: 03/18/16  2:46 PM  Result Value Ref Range   Color, UA dark yellow    Clarity, UA clear    Glucose, UA neg    Bilirubin, UA neg    Ketones, UA neg    Spec Grav, UA >=1.030    Blood, UA small    pH, UA 5.0    Protein, UA neg    Urobilinogen, UA negative    Nitrite, UA neg    Leukocytes, UA large (3+) (A) Negative      PHQ2/9: Depression screen [facility] 2/9 03/18/2016 05/09/2015  Decreased Interest 0 0  Down, Depressed, Hopeless 0 0  PHQ - 2 Score 0 0     Fall Risk: Fall Risk  03/18/2016 05/09/2015  Falls in the past year? No No     Functional Status Survey: Is the patient deaf or have difficulty hearing?: No Does the patient have difficulty seeing, even when wearing glasses/contacts?: No Does the patient have difficulty concentrating, remembering, or making decisions?: No Does the patient have difficulty walking or climbing stairs?: No Does the patient have difficulty dressing or bathing?: No Does the patient have difficulty doing errands alone such as visiting a doctor's office or shopping?: No    Assessment & Plan  1. Migraine without aura and without status migrainosus, not intractable  - nortriptyline (PAMELOR) 10 MG capsule; Take 1-2 capsules (10-20 mg total) by mouth at bedtime.  Dispense: 60 capsule; Refill: 5  2. Recurrent UTI  - POCT Urinalysis Dipstick - CULTURE, URINE COMPREHENSIVE  3. Uncontrolled hypertension  - metoprolol succinate (TOPROL-XL) 50 MG 24 hr tablet; Take 1 tablet (50 mg total) by mouth daily. Take with or immediately following a meal.  Dispense: 30 tablet; Refill: 0  4. Pressure sore  - Ambulatory  referral to Home Health

## 2016-03-19 ENCOUNTER — Telehealth: Payer: Self-pay | Admitting: Family Medicine

## 2016-03-19 ENCOUNTER — Other Ambulatory Visit: Payer: Self-pay | Admitting: Family Medicine

## 2016-03-19 DIAGNOSIS — L8990 Pressure ulcer of unspecified site, unspecified stage: Secondary | ICD-10-CM

## 2016-03-19 NOTE — Telephone Encounter (Signed)
Refer to wound center - he does not drive, goes by medicare transportation to work

## 2016-03-19 NOTE — Telephone Encounter (Signed)
Pts mom states that patients insurance will not pay for wound care to come out to their home. She is requesting a referral be done for pt to go to a facility. Please advise.

## 2016-03-19 NOTE — Telephone Encounter (Signed)
patient does not qualify for medicare guideline home home bound. They received the referral and when they called the patient he told them that he works every day and does not get home until 330-4p and was requesting for them to come out around 5p. Lowella Bandyikki states that patient has to be homebound in order for them to help them.  Nikki From [facility]: [phone]

## 2016-03-20 LAB — CULTURE, URINE COMPREHENSIVE
COLONY COUNT: NO GROWTH
ORGANISM ID, BACTERIA: NO GROWTH

## 2016-03-25 ENCOUNTER — Encounter: Payer: Medicare Other | Attending: Internal Medicine | Admitting: Internal Medicine

## 2016-03-25 DIAGNOSIS — Q059 Spina bifida, unspecified: Secondary | ICD-10-CM | POA: Insufficient documentation

## 2016-03-25 DIAGNOSIS — L97821 Non-pressure chronic ulcer of other part of left lower leg limited to breakdown of skin: Secondary | ICD-10-CM | POA: Insufficient documentation

## 2016-03-25 DIAGNOSIS — L89890 Pressure ulcer of other site, unstageable: Secondary | ICD-10-CM | POA: Diagnosis not present

## 2016-03-25 DIAGNOSIS — I10 Essential (primary) hypertension: Secondary | ICD-10-CM | POA: Insufficient documentation

## 2016-03-26 NOTE — Progress Notes (Signed)
Juan Hodges, Exodus L. ([id number]) Visit Report for 03/25/2016 Abuse/Suicide Risk Screen Details Patient Name: Juan Hodges, Juan L. Date of Service: 03/25/2016 8:45 AM Medical Record Patient Account Number: [id number] Number: Treating RN: Clover MealyAfful, RN, BSN, Rita 1975/09/21 (39 y.o. Other Clinician: Date of Birth/Sex: Male) Treating ROBSON, MICHAEL Primary Care Physician/Extender: Rob BuntingG Sowles, Danna HeftyKrichna Physician: Referring Physician: Liam RogersSowles, Krichna Weeks in Treatment: 0 Abuse/Suicide Risk Screen Items Answer ABUSE/SUICIDE RISK SCREEN: Has anyone close to you tried to hurt or harm you recentlyo No Do you feel uncomfortable with anyone in your familyo No Has anyone forced you do things that you didnot want to doo No Do you have any thoughts of harming yourselfo No Patient displays signs or symptoms of abuse and/or neglect. No Electronic Signature(s) Signed: 03/25/2016 4:14:50 PM By: Elpidio EricAfful, Rita BSN, RN Entered By: Elpidio EricAfful, Rita on 03/25/2016 08:47:49 Juan Hodges, Juan L. ([id number]) -------------------------------------------------------------------------------- Activities of Daily Living Details Patient Name: Juan Hodges, Juan L. Date of Service: 03/25/2016 8:45 AM Medical Record Patient Account Number: [id number] Number: Treating RN: Clover MealyAfful, RN, BSN, Rita 1975/09/21 (39 y.o. Other Clinician: Date of Birth/Sex: Male) Treating ROBSON, MICHAEL Primary Care Physician/Extender: Rob BuntingG Sowles, Danna HeftyKrichna Physician: Referring Physician: Liam RogersSowles, Krichna Weeks in Treatment: 0 Activities of Daily Living Items Answer Activities of Daily Living (Please select one for each item) Drive Automobile Need Assistance Take Medications Need Assistance Use Telephone Need Assistance Care for Appearance Need Assistance Use Toilet Need Assistance Bath / Shower Need Assistance Dress Self Need Assistance Feed Self Need Assistance Walk Need Assistance Get In / Out Bed Need Assistance Housework Need  Assistance Prepare Meals Need Assistance Handle Money Need Assistance Shop for Self Need Assistance Electronic Signature(s) Signed: 03/25/2016 4:14:50 PM By: Elpidio EricAfful, Rita BSN, RN Entered By: Elpidio EricAfful, Rita on 03/25/2016 08:48:27 Juan Hodges, Juan L. ([id number]) -------------------------------------------------------------------------------- Education Assessment Details Patient Name: Juan Hodges, Juan L. Date of Service: 03/25/2016 8:45 AM Medical Record Patient Account Number: [id number] Number: Treating RN: Clover MealyAfful, RN, BSN, Rita 1975/09/21 (39 y.o. Other Clinician: Date of Birth/Sex: Male) Treating ROBSON, MICHAEL Primary Care Physician/Extender: Rob BuntingG Sowles, Danna HeftyKrichna Physician: Referring Physician: Liam RogersSowles, Krichna Weeks in Treatment: 0 Primary Learner Assessed: Patient Learning Preferences/Education Level/Primary Language Learning Preference: Explanation Highest Education Level: High School Preferred Language: English Cognitive Barrier Assessment/Beliefs Language Barrier: No Physical Barrier Assessment Impaired Vision: No Impaired Hearing: No Decreased Hand dexterity: No Knowledge/Comprehension Assessment Knowledge Level: High Comprehension Level: High Ability to understand written High instructions: Ability to understand verbal High instructions: Motivation Assessment Anxiety Level: Calm Cooperation: Cooperative Education Importance: Acknowledges Need Perception: Coherent Willingness to Engage in Self- High Management Activities: Readiness to Engage in Self- High Management Activities: Electronic Signature(s) Signed: 03/25/2016 4:14:50 PM By: Elpidio EricAfful, Rita BSN, RN Entered By: Elpidio EricAfful, Rita on 03/25/2016 08:48:54 Juan Hodges, Juan L. ([id number] Hodges) [name], Juan OliveROY L. ([id number]) -------------------------------------------------------------------------------- Fall Risk Assessment Details Patient Name: Juan Hodges, Juan L. Date of Service: 03/25/2016 8:45 AM Medical Record Patient  Account Number: [id number] Number: Treating RN: Clover MealyAfful, RN, BSN, Rita 1975/09/21 (39 y.o. Other Clinician: Date of Birth/Sex: Male) Treating ROBSON, MICHAEL Primary Care Physician/Extender: Rob BuntingG Sowles, Danna HeftyKrichna Physician: Referring Physician: Liam RogersSowles, Krichna Weeks in Treatment: 0 Fall Risk Assessment Items Have you had 2 or more falls in the last 12 monthso 0 No Have you had any fall that resulted in injury in the last 12 monthso 0 No FALL RISK ASSESSMENT: History of falling - immediate or within 3 months 25 Yes Secondary diagnosis 0 No Ambulatory aid None/bed rest/wheelchair/nurse 0 Yes Crutches/cane/walker 0 No Furniture 0 No IV Access/Saline Lock  0 No Gait/Training Normal/bed rest/immobile 0 Yes Weak 10 Yes Impaired 0 No Mental Status Oriented to own ability 0 Yes Electronic Signature(s) Signed: 03/25/2016 4:14:50 PM By: Elpidio Eric BSN, RN Entered By: Elpidio Eric on 03/25/2016 08:49:08 Juan Hodges ([id number]) -------------------------------------------------------------------------------- Foot Assessment Details Patient Name: Juan Hodges Date of Service: 03/25/2016 8:45 AM Medical Record Patient Account Number: [id number] Number: Treating RN: Clover Mealy, RN, BSN, Rita Jan 16, 1976 (39 y.o. Other Clinician: Date of Birth/Sex: Male) Treating ROBSON, MICHAEL Primary Care Physician/Extender: Rob Bunting, Danna Hefty Physician: Referring Physician: Liam Rogers in Treatment: 0 Foot Assessment Items Site Locations + = Sensation present, - = Sensation absent, C = Callus, U = Ulcer R = Redness, W = Warmth, M = Maceration, PU = Pre-ulcerative lesion F = Fissure, S = Swelling, D = Dryness Assessment Right: Left: Other Deformity: No No Prior Foot Ulcer: No No Prior Amputation: No No Charcot Joint: No No Ambulatory Status: Ambulatory Without Help Gait: Steady Electronic Signature(s) Signed: 03/25/2016 4:14:50 PM By: Elpidio Eric BSN, RN Entered  By: Elpidio Eric on 03/25/2016 08:49:38 Juan Hodges ([id number] Hodges, Juan Olive ([id number]) -------------------------------------------------------------------------------- Nutrition Risk Assessment Details Patient Name: Juan Hodges Date of Service: 03/25/2016 8:45 AM Medical Record Patient Account Number: [id number] Number: Treating RN: Clover Mealy, RN, BSN, Rita 1976/01/17 (39 y.o. Other Clinician: Date of Birth/Sex: Male) Treating ROBSON, MICHAEL Primary Care Physician/Extender: Rob Bunting, Danna Hefty Physician: Referring Physician: Alba Cory Weeks in Treatment: 0 Height (in): Weight (lbs): Body Mass Index (BMI): Nutrition Risk Assessment Items NUTRITION RISK SCREEN: I have an illness or condition that made me change the kind and/or 0 No amount of food I eat I eat fewer than two meals per day 0 No I eat few fruits and vegetables, or milk products 0 No I have three or more drinks of beer, liquor or wine almost every day 0 No I have tooth or mouth problems that make it hard for me to eat 0 No I don't always have enough money to buy the food I need 0 No I eat alone most of the time 0 No I take three or more different prescribed or over-the-counter drugs a 0 No day Without wanting to, I have lost or gained 10 pounds in the last six 0 No months I am not always physically able to shop, cook and/or feed myself 0 No Nutrition Protocols Good Risk Protocol 0 No interventions needed Moderate Risk Protocol Electronic Signature(s) Signed: 03/25/2016 4:14:50 PM By: Elpidio Eric BSN, RN Entered By: Elpidio Eric on 03/25/2016 08:65:78

## 2016-03-27 NOTE — Progress Notes (Signed)
Juan Hodges, Denham L. ([id number]) Visit Report for 03/25/2016 Allergy List Details Patient Name: Juan Hodges, Juan L. Date of Service: 03/25/2016 8:45 AM Medical Record Patient Account Number: [id number] Number: Treating RN: Clover MealyAfful, RN, BSN, Rita June 17, 1976 (39 y.o. Other Clinician: Date of Birth/Sex: Male) Treating ROBSON, MICHAEL Primary Care Physician/Extender: Rob BuntingG Sowles, Danna HeftyKrichna Physician: Referring Physician: Alba CorySowles, Krichna Weeks in Treatment: 0 Allergies Active Allergies no known allergies Allergy Notes Electronic Signature(s) Signed: 03/25/2016 4:14:50 PM By: Elpidio EricAfful, Rita BSN, RN Entered By: Elpidio EricAfful, Rita on 03/25/2016 08:53:23 Juan Hodges, Juan L. ([id number]) -------------------------------------------------------------------------------- Arrival Information Details Patient Name: Juan Hodges, Juan L. Date of Service: 03/25/2016 8:45 AM Medical Record Patient Account Number: [id number] Number: Treating RN: Clover MealyAfful, RN, BSN, Rita June 17, 1976 (39 y.o. Other Clinician: Date of Birth/Sex: Male) Treating ROBSON, MICHAEL Primary Care Physician/Extender: Rob BuntingG Sowles, Danna HeftyKrichna Physician: Referring Physician: Liam RogersSowles, Krichna Weeks in Treatment: 0 Visit Information Patient Arrived: Ambulatory Arrival Time: 08:45 Accompanied By: mommy Transfer Assistance: None Patient Identification Verified: Yes Secondary Verification Process Yes Completed: Patient Requires Transmission-Based No Precautions: Patient Has Alerts: No Electronic Signature(s) Signed: 03/25/2016 4:14:50 PM By: Elpidio EricAfful, Rita BSN, RN Entered By: Elpidio EricAfful, Rita on 03/25/2016 08:47:28 Juan Hodges, Juan L. ([id number]) -------------------------------------------------------------------------------- Clinic Level of Care Assessment Details Patient Name: Juan Hodges, Juan L. Date of Service: 03/25/2016 8:45 AM Medical Record Patient Account Number: [id number] Number: Treating RN: Clover MealyAfful, RN, BSN, Rita June 17, 1976 (39 y.o.  Other Clinician: Date of Birth/Sex: Male) Treating ROBSON, MICHAEL Primary Care Physician/Extender: Rob BuntingG Sowles, Danna HeftyKrichna Physician: Referring Physician: Liam RogersSowles, Krichna Weeks in Treatment: 0 Clinic Level of Care Assessment Items TOOL 2 Quantity Score []  - Use when only an EandM is performed on the INITIAL visit 0 ASSESSMENTS - Nursing Assessment / Reassessment X - General Physical Exam (combine w/ comprehensive assessment (listed just 1 20 below) when performed on new pt. evals) X - Comprehensive Assessment (HX, ROS, Risk Assessments, Wounds Hx, etc.) 1 25 ASSESSMENTS - Wound and Skin Assessment / Reassessment X - Simple Wound Assessment / Reassessment - one wound 1 5 []  - Complex Wound Assessment / Reassessment - multiple wounds 0 []  - Dermatologic / Skin Assessment (not related to wound area) 0 ASSESSMENTS - Ostomy and/or Continence Assessment and Care []  - Incontinence Assessment and Management 0 []  - Ostomy Care Assessment and Management (repouching, etc.) 0 PROCESS - Coordination of Care X - Simple Patient / Family Education for ongoing care 1 15 []  - Complex (extensive) Patient / Family Education for ongoing care 0 []  - Staff obtains ChiropractorConsents, Records, Test Results / Process Orders 0 []  - Staff telephones HHA, Nursing Homes / Clarify orders / etc 0 []  - Routine Transfer to another Facility (non-emergent condition) 0 []  - Routine Hospital Admission (non-emergent condition) 0 X - New Admissions / Manufacturing engineernsurance Authorizations / Ordering NPWT, Apligraf, etc. 1 15 Juan Hodges, Juan L. ([id number]) []  - Emergency Hospital Admission (emergent condition) 0 []  - Simple Discharge Coordination 0 []  - Complex (extensive) Discharge Coordination 0 PROCESS - Special Needs []  - Pediatric / Minor Patient Management 0 []  - Isolation Patient Management 0 []  - Hearing / Language / Visual special needs 0 []  - Assessment of Community assistance (transportation, D/C planning, etc.) 0 []  - Additional  assistance / Altered mentation 0 []  - Support Surface(s) Assessment (bed, cushion, seat, etc.) 0 INTERVENTIONS - Wound Cleansing / Measurement X - Wound Imaging (photographs - any number of wounds) 1 5 []  - Wound Tracing (instead of photographs) 0 []  - Simple Wound Measurement - one wound 0 []  -  Complex Wound Measurement - multiple wounds 0  - Simple Wound Cleansing - one wound 0  - Complex Wound Cleansing - multiple wounds 0 INTERVENTIONS - Wound Dressings  - Small Wound Dressing one or multiple wounds 0  - Medium Wound Dressing one or multiple wounds 0  - Large Wound Dressing one or multiple wounds 0  - Application of Medications - injection 0 INTERVENTIONS - Miscellaneous  - External ear exam 0  - Specimen Collection (cultures, biopsies, blood, body fluids, etc.) 0  - Specimen(s) / Culture(s) sent or taken to Lab for analysis 0  - Patient Transfer (multiple staff / Michiel Sites Lift / Similar devices) 0 Juan Hodges, Juan L. ([id number])  - Simple Staple / Suture removal (25 or less) 0  - Complex Staple / Suture removal (26 or more) 0  - Hypo / Hyperglycemic Management (close monitor of Blood Glucose) 0 X - Ankle / Brachial Index (ABI) - do not check if billed separately 1 15 Has the patient been seen at the hospital within the last three years: Yes Total Score: 100 Level Of Care: New/Established - Level 3 Electronic Signature(s) Signed: 03/25/2016 4:14:50 PM By: Elpidio Eric BSN, RN Entered By: Elpidio Eric on 03/25/2016 09:21:52 Juan Hodges ([id number]) -------------------------------------------------------------------------------- Encounter Discharge Information Details Patient Name: Juan Hodges Date of Service: 03/25/2016 8:45 AM Medical Record Patient Account Number: [id number] Number: Treating RN: Clover Mealy, RN, BSN, Rita [birth year] (39 y.o. Other Clinician: Date of Birth/Sex: Male) Treating ROBSON, MICHAEL Primary Care Physician/Extender:  Rob Bunting, Danna Hefty Physician: Referring Physician: Liam Rogers in Treatment: 0 Encounter Discharge Information Items Discharge Pain Level: 0 Discharge Condition: Stable Ambulatory Status: Ambulatory Discharge Destination: Home Transportation: Private Auto Accompanied By: Thressa Sheller Schedule Follow-up Appointment: No Medication Reconciliation completed and provided to Patient/Care No Megan Presti: Provided on Clinical Summary of Care: 03/25/2016 Form Type Recipient Paper Patient TS Electronic Signature(s) Signed: 03/25/2016 9:22:18 AM By: Elpidio Eric BSN, RN Previous Signature: 03/25/2016 9:17:02 AM Version By: Gwenlyn Perking Entered By: Elpidio Eric on 03/25/2016 09:22:18 Juan Hodges ([id number]) -------------------------------------------------------------------------------- Lower Extremity Assessment Details Patient Name: Juan Hodges Date of Service: 03/25/2016 8:45 AM Medical Record Patient Account Number: [id number] Number: Treating RN: Clover Mealy, RN, BSN, Rita [birth year] (39 y.o. Other Clinician: Date of Birth/Sex: Male) Treating ROBSON, MICHAEL Primary Care Physician/Extender: Rob Bunting, Danna Hefty Physician: Referring Physician: Liam Rogers in Treatment: 0 Edema Assessment Assessed: [Left: No] [Right: No] Edema: [Left: No] [Right: No] Vascular Assessment Claudication: Claudication Assessment [Left:None] [Right:None] Pulses: Posterior Tibial Dorsalis Pedis Palpable: [Left:Yes] [Right:Yes] Extremity colors, hair growth, and conditions: Extremity Color: [Left:Mottled] Hair Growth on Extremity: [Left:Yes] Temperature of Extremity: [Left:Warm] Capillary Refill: [Left:< 3 seconds] Blood Pressure: Brachial: [Left:144] [Right:138] Dorsalis Pedis: 140 [Left:Dorsalis Pedis:] Ankle: Posterior Tibial: 138 [Left:Posterior Tibial: 0.97] Toe Nail Assessment Left: Right: Thick: No Discolored: No Deformed: No Improper Length and Hygiene:  No Notes Right ABI not done due to Brace in place Electronic Signature(s) Signed: 03/25/2016 4:14:50 PM By: Elpidio Eric BSN, RN [name], Holger Elbert Ewings ([id number]) Entered By: Elpidio Eric on 03/25/2016 09:15:52 Juan Hodges ([id number]) -------------------------------------------------------------------------------- Pain Assessment Details Patient Name: Juan Hodges Date of Service: 03/25/2016 8:45 AM Medical Record Patient Account Number: [id number] Number: Treating RN: Clover Mealy, RN, BSN, Rita 01/22/1976 (39 y.o. Other Clinician: Date of Birth/Sex: Male) Treating ROBSON, MICHAEL Primary Care Physician/Extender: Rob Bunting, Danna Hefty Physician: Referring Physician: Liam Rogers in Treatment: 0 Active Problems Location of Pain Severity and Description of Pain Patient Has Paino No Site Locations  With Dressing Change: No Pain Management and Medication Current Pain Management: Electronic Signature(s) Signed: 03/25/2016 4:14:50 PM By: Elpidio Eric BSN, RN Entered By: Elpidio Eric on 03/25/2016 08:47:39 Juan Hodges ([id number]) -------------------------------------------------------------------------------- Patient/Caregiver Education Details Patient Name: Juan Hodges Date of Service: 03/25/2016 8:45 AM Medical Record Patient Account Number: [id number] Number: Treating RN: Clover Mealy, RN, BSN, Rita 25-Feb-1976 (39 y.o. Other Clinician: Date of Birth/Gender: Male) Treating Baltazar Najjar Primary Care Physician: Alba Cory Physician/Extender: G Referring Physician: Liam Rogers in Treatment: 0 Education Assessment Education Provided To: Patient Education Topics Provided Pressure: Handouts: Preventing Pressure Ulcers Methods: Explain/Verbal Responses: State content correctly Electronic Signature(s) Signed: 03/25/2016 4:14:50 PM By: Elpidio Eric BSN, RN Entered By: Elpidio Eric on 03/25/2016 09:22:44 Juan Hodges  ([id number]) -------------------------------------------------------------------------------- Wound Assessment Details Patient Name: Juan Hodges Date of Service: 03/25/2016 8:45 AM Medical Record Patient Account Number: [id number] Number: Treating RN: Clover Mealy, RN, BSN, Rita 01/15/1976 (39 y.o. Other Clinician: Date of Birth/Sex: Male) Treating ROBSON, MICHAEL Primary Care Physician/Extender: Rob Bunting, Danna Hefty Physician: Referring Physician: Liam Rogers in Treatment: 0 Wound Status Wound Number: 1 Primary Etiology: Pressure Ulcer Wound Location: Left Lower Leg - Lateral Wound Status: Open Wounding Event: Gradually Appeared Comorbid History: Hypertension Date Acquired: 03/16/2016 Weeks Of Treatment: 0 Clustered Wound: No Wound Measurements Length: (cm) 0 % Reducti Width: (cm) 0 % Reducti Depth: (cm) 0 Epithelia Area: (cm) 0 Tunnelin Volume: (cm) 0 Undermin on in Area: on in Volume: lization: Large (67-100%) g: No ing: No Wound Description Classification: Unstageable/Unclassified Wound Margin: Distinct, outline attached Exudate Amount: None Present Foul Odor After Cleansing: No Wound Bed Granulation Amount: None Present (0%) Exposed Structure Necrotic Amount: None Present (0%) Fascia Exposed: No Fat Layer Exposed: No Tendon Exposed: No Muscle Exposed: No Joint Exposed: No Bone Exposed: No Limited to Skin Breakdown Periwound Skin Texture Texture Color No Abnormalities Noted: No No Abnormalities Noted: No Callus: No Atrophie Blanche: No Crepitus: No Cyanosis: No Excoriation: No Ecchymosis: No Vigo, Limuel L. ([id number]) Fluctuance: No Erythema: No Friable: No Hemosiderin Staining: No Induration: No Mottled: No Localized Edema: No Pallor: No Rash: No Rubor: No Scarring: No Temperature / Pain Moisture Temperature: No Abnormality No Abnormalities Noted: No Dry / Scaly: Yes Maceration: No Moist: No Wound Preparation Ulcer  Cleansing: Rinsed/Irrigated with Saline Topical Anesthetic Applied: Other: lidocaine 4%, Electronic Signature(s) Signed: 03/25/2016 4:14:50 PM By: Elpidio Eric BSN, RN Entered By: Elpidio Eric on 03/25/2016 09:04:49 Juan Hodges ([id number]) -------------------------------------------------------------------------------- Vitals Details Patient Name: Juan Hodges Date of Service: 03/25/2016 8:45 AM Medical Record Patient Account Number: [id number] Number: Treating RN: Clover Mealy, RN, BSN, Rita Nov 24, 1975 (39 y.o. Other Clinician: Date of Birth/Sex: Male) Treating ROBSON, MICHAEL Primary Care Physician/Extender: Rob Bunting, Danna Hefty Physician: Referring Physician: Liam Rogers in Treatment: 0 Vital Signs Time Taken: 08:54 Temperature (F): 98.3 Height (in): 64 Pulse (bpm): 111 Source: Stated Respiratory Rate (breaths/min): 18 Weight (lbs): 169 Blood Pressure (mmHg): 144/83 Source: Measured Reference Range: 80 - 120 mg / dl Body Mass Index (BMI): 29 Electronic Signature(s) Signed: 03/25/2016 4:14:50 PM By: Elpidio Eric BSN, RN Entered By: Elpidio Eric on 03/25/2016 08:54:50

## 2016-03-27 NOTE — Progress Notes (Signed)
Juan Hodges, Juan Hodges ([id number]) Visit Report for 03/25/2016 Chief Complaint Document Details Patient Name: Juan Hodges, Juan Hodges Date of Service: 03/25/2016 8:45 AM Medical Record Patient Account Number: [id number] Number: Treating RN: Clover Mealy, RN, BSN, Rita 1976-02-26 (39 y.o. Other Clinician: Date of Birth/Sex: Male) Treating Alexandr Yaworski Primary Care Physician/Extender: Rob Bunting, Danna Hefty Physician: Referring Physician: Liam Rogers in Treatment: 0 Information Obtained from: Patient Chief Complaint Patient is here for review of a wound on his left lateral lower leg Electronic Signature(s) Signed: 03/26/2016 3:12:28 PM By: Baltazar Najjar MD Entered By: Baltazar Najjar on 03/25/2016 12:29:51 Juan Hodges ([id number]) -------------------------------------------------------------------------------- HPI Details Patient Name: Juan Hodges Date of Service: 03/25/2016 8:45 AM Medical Record Patient Account Number: [id number] Number: Treating RN: Clover Mealy, RN, BSN, Rita 04/17/1976 (39 y.o. Other Clinician: Date of Birth/Sex: Male) Treating Damarcus Reggio Primary Care Physician/Extender: Rob Bunting, Danna Hefty Physician: Referring Physician: Liam Rogers in Treatment: 0 History of Present Illness HPI Description: 03/25/16; this is a 40 year old man who has underlying spina bifida. He also has an ankle fusion on the left probably bilaterally. 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 Roughly 2 or 3 weeks ago they noted drainage on his sock and an open area on the left anterior lateral leg. He was seen in his primary physician's office. They tried to arrange home health however apparently his insurance would not except home health. He is therefore here for our review. They have simply been offloading this with foam and as of today this appears to have healed Electronic Signature(s) Signed: 03/26/2016 3:12:28 PM By: Baltazar Najjar MD Entered By: Baltazar Najjar on 03/25/2016 12:32:30 Juan Hodges ([id number]) -------------------------------------------------------------------------------- Physical Exam Details Patient Name: Juan Hodges Date of Service: 03/25/2016 8:45 AM Medical Record Patient Account Number: [id number] Number: Treating RN: Clover Mealy, RN, BSN, Rita [birth year] (39 y.o. Other Clinician: Date of Birth/Sex: Male) Treating Claudette Wermuth Primary Care Physician/Extender: Rob Bunting, Danna Hefty Physician: Referring Physician: Liam Rogers in Treatment: 0 Constitutional Sitting or standing Blood Pressure is within target range for patient.. Pulse regular and within target range for patient.Marland Kitchen Respirations regular, non-labored and within target range.. Temperature is normal and within the target range for the patient.. Patient's appearance is neat and clean. Appears in no acute distress. Well nourished and well developed.. Eyes Conjunctivae clear. No discharge.Marland Kitchen Respiratory Respiratory effort is easy and symmetric bilaterally. Rate is normal at rest and on room air.. Cardiovascular Pedal pulses palpable and strong bilaterally.. Extremities are free of varicosities, clubbing or edema. Peripheral pulses strong and equal. Capillary refill < 3 seconds.. Musculoskeletal I believe his ankle is fused. Notes Wound exam; there is a healed area on the lateral aspect of his left. I took off some eschar here there is no open area no drainage and no sign of infection. They have simply been protecting his in his AFO brace and I think that's all we need to do going forward. Electronic Signature(s) Signed: 03/26/2016 3:12:28 PM By: Baltazar Najjar MD Entered By: Baltazar Najjar on 03/25/2016 12:34:00 Juan Hodges ([id number]) -------------------------------------------------------------------------------- Physician Orders Details Patient Name: Juan Hodges Date of Service:  03/25/2016 8:45 AM Medical Record Patient Account Number: [id number] Number: Treating RN: Clover Mealy, RN, BSN, Rita [birth year] (39 y.o. Other Clinician: Date of Birth/Sex: Male) Treating Chesni Vos Primary Care Physician/Extender: Rob Bunting, Danna Hefty Physician: Referring Physician: Liam Rogers in Treatment: 0 Verbal / Phone Orders: Yes Clinician: Afful, RN, BSN,  Sink  Read Back and Verified: Yes Diagnosis Coding Discharge From [facility] Services o Discharge from Wound Care Center - Consult Electronic Signature(s) Signed: 03/25/2016 4:14:50 PM By: Elpidio Eric BSN, RN Signed: 03/26/2016 3:12:28 PM By: Baltazar Najjar MD Entered By: Elpidio Eric on 03/25/2016 09:16:39 Juan Hodges ([id number]) -------------------------------------------------------------------------------- Problem List Details Patient Name: Juan Hodges Date of Service: 03/25/2016 8:45 AM Medical Record Patient Account Number: [id number] Number: Treating RN: Clover Mealy, RN, BSN, Rita [birth year] (39 y.o. 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 Other Clinician: Date of Birth/Sex: Male) Treating Britain Saber Primary Care Physician/Extender: Rob Bunting, Danna Hefty Physician: Referring Physician: Liam Rogers in Treatment: 0 Subjective Chief Complaint Information obtained from Patient Patient is here for review of a wound on his left lateral lower leg History of Present Illness (HPI) 03/25/16; this is a 40 year old man who has underlying spina bifida. He also has an ankle fusion on the left probably bilaterally. He wears AFO braces during the day and is active at a [employer] summer camp. Roughly 2 or 3 weeks ago they noted drainage on his sock and an open area on the left anterior lateral leg. He was seen in his primary physician's office. They tried to arrange home health however apparently his insurance would not except home health. He is therefore here for our review. They have simply been offloading this with foam and as of today this appears to have healed Wound History Patient presents with 1 open wound that has been present for approximately one week. Patient has been treating wound in the following manner: neosporin. Laboratory tests have not been performed in the last month. Patient reportedly has not tested positive for an antibiotic resistant organism. Patient reportedly has not tested positive for osteomyelitis. Patient reportedly has not had testing performed to evaluate circulation in the legs. Patient experiences the following problems associated with their wounds: infection. Patient History Information obtained from Patient. Allergies no known allergies Family History Cancer - Siblings, Heart Disease - Father, Hypertension - Paternal Grandparents, Maternal Grandparents, Father, No family history of Diabetes, Hereditary Spherocytosis, Kidney Disease, Lung Disease, Seizures, Stroke, Thyroid Problems, Tuberculosis. Social History Never smoker, Marital Status - Single, Alcohol Use -  Never, Drug Use - No History, Caffeine Use - Goh, Juan L. ([id number]) Moderate. Medical History Ear/Nose/Mouth/Throat Denies history of Chronic sinus problems/congestion, Middle ear problems Hematologic/Lymphatic Denies history of Anemia, Hemophilia, Human Immunodeficiency Virus, Lymphedema, Sickle Cell Disease Respiratory Denies history of Aspiration, Asthma, Chronic Obstructive Pulmonary Disease (COPD), Pneumothorax, Sleep Apnea, Tuberculosis Cardiovascular Patient has history of Hypertension Gastrointestinal Denies history of Cirrhosis , Colitis, Crohn s, Hepatitis A, Hepatitis B, Hepatitis C Endocrine Denies history of Type I Diabetes, Type II Diabetes Genitourinary Denies history of End Stage Renal Disease Immunological Denies history of Lupus Erythematosus, Raynaud s, Scleroderma Integumentary (Skin) Denies history of History of Burn, History of pressure wounds Musculoskeletal Denies  history of Gout, Rheumatoid Arthritis, Osteoarthritis, Osteomyelitis Oncologic Denies history of Received Chemotherapy, Received Radiation Psychiatric Denies history of Anorexia/bulimia, Confinement Anxiety Medical And Surgical History Notes Neurologic Spina Bifiida Review of Systems (ROS) Constitutional Symptoms (General Health) The patient has no complaints or symptoms. Eyes The patient has no complaints or symptoms. Ear/Nose/Mouth/Throat The patient has no complaints or symptoms. Hematologic/Lymphatic The patient has no complaints or symptoms. Respiratory The patient has no complaints or symptoms. Cardiovascular The patient has no complaints or symptoms. Gastrointestinal The patient has no complaints or symptoms. Endocrine The patient has no complaints or symptoms. Genitourinary Juan Hodges, Juan Elbert EwingsL. ([id number]) The patient has no complaints or symptoms. Immunological The patient has no complaints or symptoms. Integumentary (Skin) Complains or has symptoms of Wounds,  Breakdown. Musculoskeletal The patient has no complaints or symptoms. Neurologic The patient has no complaints or symptoms. Oncologic The patient has no complaints or symptoms. Psychiatric The patient has no complaints or symptoms. Objective Constitutional Sitting or standing Blood Pressure is within target range for patient.. Pulse regular and within target range for patient.Marland Kitchen. Respirations regular, non-labored and within target range.. Temperature is normal and within the target range for the patient.. Patient's appearance is neat and clean. Appears in no acute distress. Well nourished and well developed.. Vitals Time Taken: 8:54 AM, Height: 64 in, Source: Stated, Weight: 169 lbs, Source: Measured, BMI: 29, Temperature: 98.3 F, Pulse: 111 bpm, Respiratory Rate: 18 breaths/min, Blood Pressure: 144/83 mmHg. Eyes Conjunctivae clear. No discharge.Marland Kitchen. Respiratory Respiratory effort is easy and symmetric bilaterally. Rate is normal at rest and on room air.. Cardiovascular Pedal pulses palpable and strong bilaterally.. Extremities are free of varicosities, clubbing or edema. Peripheral pulses strong and equal. Capillary refill < 3 seconds.. Musculoskeletal I believe his ankle is fused. General Notes: Wound exam; there is a healed area on the lateral aspect of his left. I took off some eschar here there is no open area no drainage and no sign of infection. They have simply been protecting his in his AFO brace and I think that's all we need to do going forward. Integumentary (Hair, Skin) Juan Hodges, Juan L. ([id number]) Wound #1 status is Open. Original cause of wound was Gradually Appeared. The wound is located on the Left,Lateral Lower Leg. The wound measures 0cm length x 0cm width x 0cm depth; 0cm^2 area and 0cm^3 volume. The wound is limited to skin breakdown. There is no tunneling or undermining noted. There is a none present amount of drainage noted. The wound margin is distinct with the  outline attached to the wound base. There is no granulation within the wound bed. There is no necrotic tissue within the wound bed. The periwound skin appearance exhibited: Dry/Scaly. The periwound skin appearance did not exhibit: Callus, Crepitus, Excoriation, Fluctuance, Friable, Induration, Localized Edema, Rash, Scarring, Maceration, Moist, Atrophie Blanche, Cyanosis, Ecchymosis, Hemosiderin Staining, Mottled, Pallor, Rubor, Erythema. Periwound temperature was noted as No Abnormality. 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 If with this measure he gets a recurrent wound in this area and he may need to go back to his orthotist. There is no evidence of a significant infection here and nothing believes me to believe that there is something more to this than a  simple pressure area from his brace. He has no signs of venous insufficiency his peripheral pulses are normal. ABI in this clinic was 0.97 Electronic Signature(s) Signed: 03/26/2016 3:12:28 PM By: Baltazar Najjar MD Entered By: Baltazar Najjar on 03/25/2016 12:35:20 Juan Hodges ([id number]) -------------------------------------------------------------------------------- ROS/PFSH Details Patient Name: Juan Hodges Date of Service: 03/25/2016 8:45 AM Medical Record Patient Account Number: [id number] Number: Treating RN: Clover Mealy, RN, BSN, Rita [birth year] (39 y.o. Other Clinician: Date of Birth/Sex: Male) Treating Wandy Bossler Primary Care Physician/Extender: Rob Bunting, Danna Hefty Physician: Referring Physician: Liam Rogers in Treatment: 0 Information Obtained From Patient Wound History Do you currently have one or  more open woundso Yes How many open wounds do you currently haveo 1 Approximately how long have you had your woundso one week How have you been treating your wound(s) until nowo neosporin Has your wound(s) ever healed and then re-openedo No Have you had any lab work done in the past montho No Have you tested positive for an antibiotic resistant organism (MRSA, VRE)o No Have you tested positive for osteomyelitis (bone infection)o No Have you had any tests for circulation on your legso No Have you had other problems associated with your woundso Infection Integumentary (Skin) Complaints and Symptoms: Positive for: Wounds; Breakdown Medical History: Negative for: History of Burn; History of pressure wounds Constitutional Symptoms (General Health) Complaints and Symptoms: No Complaints or Symptoms Eyes Complaints and Symptoms: No Complaints or Symptoms Ear/Nose/Mouth/Throat Complaints and Symptoms: No Complaints or Symptoms Medical HistoryARJUNA, Juan ([id number]) Negative for: Chronic sinus problems/congestion; Middle ear problems Hematologic/Lymphatic Complaints and Symptoms: No Complaints or Symptoms Medical History: Negative for: Anemia; Hemophilia; Human Immunodeficiency Virus; Lymphedema; Sickle Cell Disease Respiratory Complaints and Symptoms: No Complaints or Symptoms Medical History: Negative for: Aspiration; Asthma; Chronic Obstructive Pulmonary Disease (COPD); Pneumothorax; Sleep Apnea; Tuberculosis Cardiovascular Complaints and Symptoms: No Complaints or Symptoms Medical History: Positive for: Hypertension Gastrointestinal Complaints and Symptoms: No Complaints or Symptoms Medical History: Negative for: Cirrhosis ; Colitis; Crohnos; Hepatitis A; Hepatitis B; Hepatitis C Endocrine Complaints and Symptoms: No Complaints or Symptoms Medical History: Negative for: Type I Diabetes; Type II Diabetes Genitourinary Complaints and Symptoms: No Complaints or  Symptoms Medical History: Negative for: End Stage Renal Disease Immunological Juan Hodges, Juan L. ([id number]) Complaints and Symptoms: No Complaints or Symptoms Medical History: Negative for: Lupus Erythematosus; Raynaudos; Scleroderma Musculoskeletal Complaints and Symptoms: No Complaints or Symptoms Medical History: Negative for: Gout; Rheumatoid Arthritis; Osteoarthritis; Osteomyelitis Neurologic Complaints and Symptoms: No Complaints or Symptoms Medical History: Past Medical History Notes: Spina Bifiida Oncologic Complaints and Symptoms: No Complaints or Symptoms Medical History: Negative for: Received Chemotherapy; Received Radiation Psychiatric Complaints and Symptoms: No Complaints or Symptoms Medical History: Negative for: Anorexia/bulimia; Confinement Anxiety Family and Social History Cancer: Yes - Siblings; Diabetes: No; Heart Disease: Yes - Father; Hereditary Spherocytosis: No; Hypertension: Yes - Paternal Grandparents, Maternal Grandparents, Father; Kidney Disease: No; Lung Disease: No; Seizures: No; Stroke: No; Thyroid Problems: No; Tuberculosis: No; Never smoker; Marital Status - Single; Alcohol Use: Never; Drug Use: No History; Caffeine Use: Moderate; Financial Concerns: No; Food, Clothing or Shelter Needs: No; Support System Lacking: No; Transportation Concerns: No; Advanced Directives: No; Patient does not want information on Advanced Directives; Living Will: No Electronic Signature(s) Signed: 03/25/2016 4:14:50 PM By: Elpidio Eric BSN, RN Signed: 03/26/2016 3:12:28 PM By: Baltazar Najjar MD Juan Hodges ([id number]) Entered By: Elpidio Eric on 03/25/2016 08:53:09 Juan Hodges ([id number]) -------------------------------------------------------------------------------- SuperBill Details Patient Name: Juan Hodges Date of Service:  03/25/2016 Medical Record Patient Account Number: [id number] Number: Treating RN: Clover Mealy, RN, BSN,  [place] Sink 1975-11-08 (39 y.o. Other Clinician: Date of Birth/Sex: Male) Treating Mukund Weinreb Primary Care Physician: Alba Cory Physician/Extender: G Referring Physician: Liam Rogers in Treatment: 0 Diagnosis Coding ICD-10 Codes Code Description [phone] Non-pressure chronic ulcer of other part of left lower leg limited to breakdown of skin Q05.9 Spina bifida, unspecified Facility Procedures CPT4 Code: [id number] Description: 99213 - WOUND CARE VISIT-LEV 3 EST PT Modifier: Quantity: 1 Physician Procedures CPT4: Description Modifier Quantity Code 1914782 [id number] - WC PHYS LEVEL 2 - NEW PT 1 ICD-10 Description Diagnosis L97.821 Non-pressure chronic ulcer of other part of left lower leg limited to breakdown of skin Electronic Signature(s) Signed: 03/26/2016 3:12:28 PM By: Baltazar Najjar MD Entered By: Baltazar Najjar on 03/25/2016 12:35:54

## 2016-04-01 ENCOUNTER — Telehealth: Payer: Self-pay | Admitting: Family Medicine

## 2016-04-01 NOTE — Telephone Encounter (Signed)
Can he please drop off another specimen, we need to check for genprobe

## 2016-04-01 NOTE — Telephone Encounter (Signed)
Patient mother was concerned due to urine showing lots of blood and pus in the dipstick but nothing in the culture. Mother wanted to ask what Dr. Carlynn Purl opinion was on this and if she going to start patient on any medication?

## 2016-04-01 NOTE — Telephone Encounter (Signed)
Informed patient and mother to drop off another specimen.

## 2016-04-22 ENCOUNTER — Encounter: Payer: Self-pay | Admitting: Family Medicine

## 2016-04-22 ENCOUNTER — Ambulatory Visit (INDEPENDENT_AMBULATORY_CARE_PROVIDER_SITE_OTHER): Payer: Medicare Other | Admitting: Family Medicine

## 2016-04-22 VITALS — BP 122/86 | HR 78 | Temp 98.4°F | Resp 18 | Ht 64.0 in | Wt 169.9 lb

## 2016-04-22 DIAGNOSIS — I10 Essential (primary) hypertension: Secondary | ICD-10-CM

## 2016-04-22 DIAGNOSIS — Z114 Encounter for screening for human immunodeficiency virus [HIV]: Secondary | ICD-10-CM | POA: Diagnosis not present

## 2016-04-22 DIAGNOSIS — Z131 Encounter for screening for diabetes mellitus: Secondary | ICD-10-CM | POA: Diagnosis not present

## 2016-04-22 DIAGNOSIS — E785 Hyperlipidemia, unspecified: Secondary | ICD-10-CM | POA: Diagnosis not present

## 2016-04-22 DIAGNOSIS — G43009 Migraine without aura, not intractable, without status migrainosus: Secondary | ICD-10-CM

## 2016-04-22 DIAGNOSIS — L8990 Pressure ulcer of unspecified site, unspecified stage: Secondary | ICD-10-CM | POA: Diagnosis not present

## 2016-04-22 DIAGNOSIS — N390 Urinary tract infection, site not specified: Secondary | ICD-10-CM | POA: Diagnosis not present

## 2016-04-22 DIAGNOSIS — E559 Vitamin D deficiency, unspecified: Secondary | ICD-10-CM | POA: Diagnosis not present

## 2016-04-22 DIAGNOSIS — R739 Hyperglycemia, unspecified: Secondary | ICD-10-CM | POA: Diagnosis not present

## 2016-04-22 DIAGNOSIS — Z79899 Other long term (current) drug therapy: Secondary | ICD-10-CM

## 2016-04-22 DIAGNOSIS — F40298 Other specified phobia: Secondary | ICD-10-CM

## 2016-04-22 LAB — POCT URINALYSIS DIPSTICK
BILIRUBIN UA: NEGATIVE
GLUCOSE UA: NEGATIVE
Ketones, UA: NEGATIVE
NITRITE UA: NEGATIVE
Protein, UA: NEGATIVE
Spec Grav, UA: 1.015
UROBILINOGEN UA: 0.2
pH, UA: 6

## 2016-04-22 MED ORDER — DIAZEPAM 5 MG PO TABS: 5.0000 mg | ORAL_TABLET | Freq: Once | ORAL | 0 refills | Status: AC

## 2016-04-22 MED ORDER — METOPROLOL SUCCINATE ER 50 MG PO TB24: 50.0000 mg | ORAL_TABLET | Freq: Every day | ORAL | 2 refills | Status: DC

## 2016-04-22 MED ORDER — LISINOPRIL-HYDROCHLOROTHIAZIDE 20-12.5 MG PO TABS: 1.0000 | ORAL_TABLET | Freq: Every day | ORAL | 2 refills | Status: DC

## 2016-04-22 NOTE — Progress Notes (Signed)
Name: Juan Hodges   MRN: [id number]    DOB: Apr 28, 1976   Date:04/22/2016       Progress Note  Subjective  Chief Complaint  Chief Complaint  Patient presents with  . Follow-up    1 month F/U  . Hypertension    Last visit started patient on Metoprolol, patient has had his BP checked by a nurse at [facility] on 04/14/16 and it was 136/96. Patient denies any side effects or symptoms of high BP.   Marland Kitchen. Chronic UTI    HPI  HTN: he is now on Lisinopril/hctz and metoprolol, bp has improved, he does not want to go up on the dose today. He denies side effects. He gets very anxious when he comes in for a visit ( white coat component to HTN). 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   Vitamin D deficiency: he stopped taking otc supplementation  Hyperlipidemia: last labs were donein 2013, LDL was 152, he needs to have labs done again. No chest pain.   Migraine: he is on beta-blocker and nortriptyline. He states only two episodes since last visit, throbbing, frontal, no phonophobia but has phonophobia.   Pressure sore: went to the wound center, and a scab was removed, mother took prosthesis to a shop and got new pads and also got is stretched and the wound has healed.    Patient Active Problem List   Diagnosis Date Noted  . H/O fracture of hip 01/01/2016  . Vitamin D deficiency 01/01/2016  . 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 Hypertension, benign 01/01/2016  . Neurogenic bladder 12/19/2009  . Migraine without aura and without status migrainosus, not intractable 11/09/2009  . Chronic urinary tract infection 08/27/2007    Past Surgical History:  Procedure Laterality Date  . SPINAL FIXATION SURGERY      Family History  Problem Relation Age of Onset  . Hyperlipidemia Mother   . Hypertension Mother     Social History   Social History  .  Marital status: Single    Spouse name: N/A  . Number of children: N/A  . Years of education: N/A   Occupational History  . Not on file.   Social History Main Topics  . Smoking status: Never Smoker  . Smokeless tobacco: Never Used  . Alcohol use No  . Drug use: No  . Sexual activity: Not on file   Other Topics Concern  . Not on file   Social History Narrative  . No narrative on file     Current Outpatient Prescriptions:  .  Cholecalciferol (D3-1000) 1000 units capsule, Take by mouth., Disp: , Rfl:  .  lisinopril-hydrochlorothiazide (PRINZIDE,ZESTORETIC) 20-12.5 MG tablet, Take 1 tablet by mouth daily., Disp: 30 tablet, Rfl: 5 .  metoprolol succinate (TOPROL-XL) 50 MG 24 hr tablet, Take 1 tablet (50 mg total) by mouth daily. Take with or immediately following a meal., Disp: 30 tablet, Rfl: 0 .  nortriptyline (PAMELOR) 10 MG capsule, Take 1-2 capsules (10-20 mg total) by mouth at bedtime., Disp: 60 capsule, Rfl: 5  No Known Allergies   ROS  Ten systems reviewed and is negative except as mentioned in HPI   Objective  Vitals:   04/22/16 1521  BP: (!) 138/92  Pulse: (!) 107  Resp: 18  Temp: 98.4 F (36.9 C)  TempSrc: Oral  SpO2: 97%  Weight: 169 lb 14.4 oz (77.1 kg)  Height: 5\' 4"  (1.626 m)    Body mass index is 29.16 kg/m.  Physical Exam  Constitutional: Patient appears well-developed and well-nourished. No distress.  HEENT: head atraumatic, normocephalic, pupils equal and reactive to light, neck supple, throat within normal limits Cardiovascular: Normal rate, regular rhythm and normal heart sounds. No murmur heard. No BLE edema. Pulmonary/Chest: Effort normal and breath sounds normal. No respiratory distress. Abdominal: Soft. There is no tenderness. Psychiatric: Patient has a normal mood and affect. behavior is normal. Judgment and thought content normal. Muscular Skeletal: wears braces because of spina bifida, leg atrophy. Ankle braces Neurological:  decrease sensation of lower extremities Skin: healed pressure sore  Recent Results (from the past 2160 hour(s))  POCT Urinalysis Dipstick     Status: Abnormal   Collection Time: 03/18/16  2:46 PM  Result Value Ref Range   Color, UA dark yellow    Clarity, UA clear    Glucose, UA neg    Bilirubin, UA neg    Ketones, UA neg    Spec Grav, UA >=1.030    Blood, UA small    pH, UA 5.0    Protein, UA neg    Urobilinogen, UA negative    Nitrite, UA neg    Leukocytes, UA large (3+) (A) Negative  CULTURE, URINE COMPREHENSIVE     Status: None   Collection Time: 03/18/16  3:18 PM  Result Value Ref Range   Colony Count NO GROWTH    Organism ID, Bacteria NO GROWTH   POCT urinalysis dipstick     Status: Abnormal   Collection Time: 04/22/16  3:18 PM  Result Value Ref Range   Color, UA yellow    Clarity, UA clear    Glucose, UA neg    Bilirubin, UA neg    Ketones, UA neg    Spec Grav, UA 1.015    Blood, UA 3+    pH, UA 6.0    Protein, UA neg    Urobilinogen, UA 0.2    Nitrite, UA neg    Leukocytes, UA large (3+) (A) Negative    PHQ2/9: Depression screen [facility] 2/9 03/18/2016 05/09/2015  Decreased Interest 0 0  Down, Depressed, Hopeless 0 0  PHQ - 2 Score 0 0     Fall Risk: Fall Risk  03/18/2016 05/09/2015  Falls in the past year? No No    Assessment & Plan  1. Hypertension, benign  - Comprehensive metabolic panel - CBC with Differential/Platelet - metoprolol succinate (TOPROL-XL) 50 MG 24 hr tablet; Take 1 tablet (50 mg total) by mouth daily. Take with or immediately following a meal.  Dispense: 30 tablet; Refill: 2 - lisinopril-hydrochlorothiazide (PRINZIDE,ZESTORETIC) 20-12.5 MG tablet; Take 1 tablet by mouth daily.  Dispense: 30 tablet; Refill: 2  2. Chronic urinary tract infection  - POCT urinalysis dipstick  3. Pressure sore  resolved  4. Needle phobia  - diazepam (VALIUM) 5 MG tablet; Take 1 tablet (5 mg total) by mouth once. 30 minutes before procedure and may  repeat right before if needed  Dispense: 2 tablet; Refill: 0  5. Dyslipidemia  - Lipid panel  6. Screening for diabetes mellitus (DM)  - Hemoglobin A1c  7. Encounter for screening for HIV  - HIV antibody   8. Long-term use of high-risk medication  - Comprehensive metabolic panel  9. Vitamin D deficiency  - VITAMIN D 25 Hydroxy (Vit-D Deficiency, Fractures)  10. Hyperglycemia  - Hemoglobin A1c  11. Migraine without aura and without status migrainosus, not intractable  - metoprolol succinate (TOPROL-XL) 50 MG 24 hr tablet; Take 1 tablet (50 mg total) by mouth daily. Take with or immediately following a meal.  Dispense: 30 tablet; Refill: 2

## 2016-05-01 ENCOUNTER — Other Ambulatory Visit: Payer: Self-pay | Admitting: Family Medicine

## 2016-05-01 DIAGNOSIS — E559 Vitamin D deficiency, unspecified: Secondary | ICD-10-CM | POA: Diagnosis not present

## 2016-05-01 DIAGNOSIS — R739 Hyperglycemia, unspecified: Secondary | ICD-10-CM | POA: Diagnosis not present

## 2016-05-01 DIAGNOSIS — Z114 Encounter for screening for human immunodeficiency virus [HIV]: Secondary | ICD-10-CM | POA: Diagnosis not present

## 2016-05-01 DIAGNOSIS — I10 Essential (primary) hypertension: Secondary | ICD-10-CM | POA: Diagnosis not present

## 2016-05-01 DIAGNOSIS — Z131 Encounter for screening for diabetes mellitus: Secondary | ICD-10-CM | POA: Diagnosis not present

## 2016-05-01 DIAGNOSIS — E785 Hyperlipidemia, unspecified: Secondary | ICD-10-CM | POA: Diagnosis not present

## 2016-05-01 DIAGNOSIS — Z79899 Other long term (current) drug therapy: Secondary | ICD-10-CM | POA: Diagnosis not present

## 2016-05-02 LAB — CBC WITH DIFFERENTIAL/PLATELET
BASOS ABS: 0 10*3/uL (ref 0.0–0.2)
Basos: 0 %
EOS (ABSOLUTE): 0.1 10*3/uL (ref 0.0–0.4)
Eos: 2 %
HEMOGLOBIN: 16.3 g/dL (ref 12.6–17.7)
Hematocrit: 47.6 % (ref 37.5–51.0)
Immature Grans (Abs): 0 10*3/uL (ref 0.0–0.1)
Immature Granulocytes: 0 %
LYMPHS ABS: 1.5 10*3/uL (ref 0.7–3.1)
Lymphs: 31 %
MCH: 29.4 pg (ref 26.6–33.0)
MCHC: 34.2 g/dL (ref 31.5–35.7)
MCV: 86 fL (ref 79–97)
MONOCYTES: 11 %
MONOS ABS: 0.6 10*3/uL (ref 0.1–0.9)
NEUTROS ABS: 2.8 10*3/uL (ref 1.4–7.0)
Neutrophils: 56 %
Platelets: 295 10*3/uL (ref 150–379)
RBC: 5.55 x10E6/uL (ref 4.14–5.80)
RDW: 13.8 % (ref 12.3–15.4)
WBC: 4.9 10*3/uL (ref 3.4–10.8)

## 2016-05-02 LAB — COMPREHENSIVE METABOLIC PANEL
ALBUMIN: 4.7 g/dL (ref 3.5–5.5)
ALK PHOS: 95 IU/L (ref 39–117)
ALT: 17 IU/L (ref 0–44)
AST: 21 IU/L (ref 0–40)
Albumin/Globulin Ratio: 1.6 (ref 1.2–2.2)
BILIRUBIN TOTAL: 0.5 mg/dL (ref 0.0–1.2)
BUN / CREAT RATIO: 13 (ref 9–20)
BUN: 13 mg/dL (ref 6–24)
CHLORIDE: 101 mmol/L (ref 96–106)
CO2: 24 mmol/L (ref 18–29)
Calcium: 9.3 mg/dL (ref 8.7–10.2)
Creatinine, Ser: 1.03 mg/dL (ref 0.76–1.27)
GFR calc Af Amer: 105 mL/min/{1.73_m2} (ref >59)
GFR calc non Af Amer: 90 mL/min/{1.73_m2} (ref >59)
GLUCOSE: 97 mg/dL (ref 65–99)
Globulin, Total: 3 g/dL (ref 1.5–4.5)
Potassium: 3.9 mmol/L (ref 3.5–5.2)
SODIUM: 141 mmol/L (ref 134–144)
Total Protein: 7.7 g/dL (ref 6.0–8.5)

## 2016-05-02 LAB — HIV ANTIBODY (ROUTINE TESTING W REFLEX): HIV Screen 4th Generation wRfx: NONREACTIVE

## 2016-05-02 LAB — LIPID PANEL WITH LDL/HDL RATIO
Cholesterol, Total: 265 mg/dL — ABNORMAL HIGH (ref 100–199)
HDL: 64 mg/dL (ref >39)
LDL CALC: 186 mg/dL — AB (ref 0–99)
LDl/HDL Ratio: 2.9 ratio units (ref 0.0–3.6)
Triglycerides: 76 mg/dL (ref 0–149)
VLDL CHOLESTEROL CAL: 15 mg/dL (ref 5–40)

## 2016-05-02 LAB — VITAMIN D 25 HYDROXY (VIT D DEFICIENCY, FRACTURES): VIT D 25 HYDROXY: 13.6 ng/mL — AB (ref 30.0–100.0)

## 2016-05-02 LAB — HGB A1C W/O EAG: HEMOGLOBIN A1C: 5.8 % — AB (ref 4.8–5.6)

## 2016-05-04 ENCOUNTER — Other Ambulatory Visit: Payer: Self-pay | Admitting: Family Medicine

## 2016-05-04 MED ORDER — VITAMIN D (ERGOCALCIFEROL) 1.25 MG (50000 UNIT) PO CAPS: 50000.0000 [IU] | ORAL_CAPSULE | ORAL | 0 refills | Status: DC

## 2016-05-08 ENCOUNTER — Telehealth: Payer: Self-pay | Admitting: Family Medicine

## 2016-05-15 NOTE — Telephone Encounter (Signed)
Please close chart, patient informed

## 2016-05-20 ENCOUNTER — Other Ambulatory Visit: Payer: Self-pay | Admitting: Family Medicine

## 2016-05-23 ENCOUNTER — Encounter: Payer: Self-pay | Admitting: Urology

## 2016-05-23 ENCOUNTER — Ambulatory Visit (INDEPENDENT_AMBULATORY_CARE_PROVIDER_SITE_OTHER): Payer: Medicare Other | Admitting: Urology

## 2016-05-23 VITALS — BP 148/90 | HR 92 | Ht 64.0 in | Wt 161.6 lb

## 2016-05-23 DIAGNOSIS — N319 Neuromuscular dysfunction of bladder, unspecified: Secondary | ICD-10-CM

## 2016-05-23 DIAGNOSIS — R310 Gross hematuria: Secondary | ICD-10-CM

## 2016-05-23 DIAGNOSIS — Z87448 Personal history of other diseases of urinary system: Secondary | ICD-10-CM

## 2016-05-23 DIAGNOSIS — R300 Dysuria: Secondary | ICD-10-CM | POA: Diagnosis not present

## 2016-05-23 LAB — URINALYSIS, COMPLETE
Bilirubin, UA: NEGATIVE
Glucose, UA: NEGATIVE
Nitrite, UA: NEGATIVE
PH UA: 5.5 (ref 5.0–7.5)
PROTEIN UA: NEGATIVE
Specific Gravity, UA: >1.03 — ABNORMAL HIGH (ref 1.005–1.030)
Urobilinogen, Ur: 1 mg/dL (ref 0.2–1.0)

## 2016-05-23 LAB — MICROSCOPIC EXAMINATION: RBC, UA: >30 /hpf — AB (ref 0–?)

## 2016-05-23 NOTE — Progress Notes (Signed)
05/23/2016 11:30 AM   Juan Hodges 11-10-1975 [id number]  Referring provider: Alba Cory, MD [address] [place], [place] [address]  Chief Complaint  Patient presents with  . Urinary Tract Infection    patient has pus and blood in urine     HPI: Patient is 40 year old African American male who presents today as a referral from Dr. Carlynn Purl for recurrent UTI's.    Patient has spina bifida and a brain shunt who manages his neurogenic bladder with CIC.  He catheterizes himself up to six times daily.  He reports that he has had blood and pus on his urine dips and 2 documented infections.  Patient does not have any symptoms of a urinary tract infection. He stated he just wants his urine checked from time to time.    His baseline urinary symptoms are frequency, urgency, incontinence (wears one depends daily) and intermittency.  He has not had recent fevers, chills, nausea or vomiting.  He does experience a small amount of blood from time to time after CIC.    He completed a hematuria work up in 2014 with CT Urogram and cystoscopy.  He was found to have a stricture in the proximal bulb/membranous urethra about 12 f.   UO's orthotopic.  Bladder was severely trabeculated with a small diverticuli in the dome.   His UA today demonstrates > 30 WBC's and > 30 RBC's.   Granular casts presents.    PMH: Past Medical History:  Diagnosis Date  . Chronic UTI (urinary tract infection)   . HLD (hyperlipidemia)   . Hypertension   . Spina bifida [facility])     Surgical History: Past Surgical History:  Procedure Laterality Date  . FEMUR SURGERY     broken bone  . HIP SURGERY    . SPINAL FIXATION SURGERY      Home Medications:    Medication List       Accurate as of 05/23/16 11:30 AM. Always use your most recent med list.          D3-1000 1000 units capsule Generic drug:  Cholecalciferol Take by mouth.   lisinopril-hydrochlorothiazide 20-12.5 MG tablet Commonly  known as:  PRINZIDE,ZESTORETIC Take 1 tablet by mouth daily.   metoprolol succinate 50 MG 24 hr tablet Commonly known as:  TOPROL-XL Take 1 tablet (50 mg total) by mouth daily. Take with or immediately following a meal.   nortriptyline 10 MG capsule Commonly known as:  PAMELOR Take 1-2 capsules (10-20 mg total) by mouth at bedtime.   Vitamin D (Ergocalciferol) 50000 units Caps capsule Commonly known as:  DRISDOL Take 1 capsule (50,000 Units total) by mouth every 7 (seven) days.       Allergies: No Known Allergies  Family History: Family History  Problem Relation Age of Onset  . Hyperlipidemia Mother   . Hypertension Mother   . Kidney cancer Maternal Grandmother   . Kidney disease Neg Hx   . Prostate cancer Neg Hx     Social History:  reports that he has never smoked. He has never used smokeless tobacco. He reports that he does not drink alcohol or use drugs.  ROS: UROLOGY Frequent Urination?: Yes Hard to postpone urination?: Yes Burning/pain with urination?: No Get up at night to urinate?: No Leakage of urine?: Yes Urine stream starts and stops?: Yes Trouble starting stream?: No Do you have to strain to urinate?: No Blood in urine?: Yes Urinary tract infection?: Yes Sexually transmitted disease?: No  Injury to kidneys or bladder?: No Painful intercourse?: No Weak stream?: No Erection problems?: No Penile pain?: No  Gastrointestinal Nausea?: No Vomiting?: No Indigestion/heartburn?: No Diarrhea?: Yes Constipation?: No  Constitutional Fever: No Night sweats?: No Weight loss?: No Fatigue?: No  Skin Skin rash/lesions?: No Itching?: No  Eyes Blurred vision?: No Double vision?: No  Ears/Nose/Throat Sore throat?: No Sinus problems?: No  Hematologic/Lymphatic Swollen glands?: No Easy bruising?: No  Cardiovascular Leg swelling?: No Chest pain?: No  Respiratory Cough?: No Shortness of breath?: No  Endocrine Excessive thirst?:  No  Musculoskeletal Back pain?: No Joint pain?: No  Neurological Headaches?: No Dizziness?: No  Psychologic Depression?: No Anxiety?: No  Physical Exam: BP (!) 148/90   Pulse 92   Ht 5\' 4"  (1.626 m)   Wt 161 lb 9.6 oz (73.3 kg)   BMI 27.74 kg/m   Constitutional: Well nourished. Alert and oriented, No acute distress. HEENT: [place] AT, moist mucus membranes. Trachea midline, no masses. Cardiovascular: No clubbing, cyanosis, or edema. Respiratory: Normal respiratory effort, no increased work of breathing. GI: Abdomen is soft, non tender, non distended, no abdominal masses. Liver and spleen not palpable.  No hernias appreciated.  Stool sample for occult testing is not indicated.   GU: No CVA tenderness.  No bladder fullness or masses.  Patient with uncircumcised phallus. Foreskin easily retracted  Urethral meatus is patent.  No penile discharge. No penile lesions or rashes. Scrotum without lesions, cysts, rashes and/or edema.  Testicles are located scrotally bilaterally. No masses are appreciated in the testicles. Left and right epididymis are normal. Rectal: Patient with  normal sphincter tone. Anus and perineum without scarring or rashes. No rectal masses are appreciated. Prostate is approximately 45 grams, no nodules are appreciated. Seminal vesicles are normal. Skin: No rashes, bruises or suspicious lesions. Lymph: No cervical or inguinal adenopathy. Neurologic: Grossly intact, no focal deficits, moving all 4 extremities. Psychiatric: Normal mood and affect.  Laboratory Data: Lab Results  Component Value Date   WBC 4.9 05/01/2016   HCT 47.6 05/01/2016   MCV 86 05/01/2016   PLT 295 05/01/2016    Lab Results  Component Value Date   CREATININE 1.03 05/01/2016      Lab Results  Component Value Date   HGBA1C 5.8 (H) 05/01/2016    Lab Results  Component Value Date   TSH 1.680 05/09/2015       Component Value Date/Time   CHOL 265 (H) 05/01/2016 1528   HDL 64  05/01/2016 1528   LDLCALC 186 (H) 05/01/2016 1528    Lab Results  Component Value Date   AST 21 05/01/2016   Lab Results  Component Value Date   ALT 17 05/01/2016     Urinalysis Results for orders placed or performed in visit on 05/23/16  CULTURE, URINE COMPREHENSIVE  Result Value Ref Range   Urine Culture, Comprehensive Final report    Result 1 Comment   Microscopic Examination  Result Value Ref Range   WBC, UA >30 (A) 0 - 5 /hpf   RBC, UA >30 (A) 0 - 2 /hpf   Epithelial Cells (non renal) 0-10 0 - 10 /hpf   Casts Present (A) None seen /lpf   Cast Type Granular casts (A) N/A   Bacteria, UA Few None seen/Few  Urinalysis, Complete  Result Value Ref Range   Specific Gravity, UA >1.030 (H) 1.005 - 1.030   pH, UA 5.5 5.0 - 7.5   Color, UA Yellow Yellow   Appearance Ur Cloudy (A)  Clear   Leukocytes, UA 2+ (A) Negative   Protein, UA Negative Negative/Trace   Glucose, UA Negative Negative   Ketones, UA Trace (A) Negative   RBC, UA 2+ (A) Negative   Bilirubin, UA Negative Negative   Urobilinogen, Ur 1.0 0.2 - 1.0 mg/dL   Nitrite, UA Negative Negative   Microscopic Examination See below:    Assessment & Plan:    1. Gross hematuria  - completed hematuria work up in 2014- no malignancy was found  - Urinalysis, Complete  - CULTURE, URINE COMPREHENSIVE  - BUN+Creat  - If urine culture is negative, will pursue hematuria work up  - reminded the patient of the risks of contrast with the CT Urogram  - reminded the patient how cystoscopy is performed and post procedure expectation  2. History of urethral stricture disease  - 3537f stricture found on cystoscopy for which the scope easily bypassed  - patient CIC without difficulty  3. Neurogenic bladder  - see above   Return for pending urine culture results.  These notes generated with voice recognition software. I apologize for typographical errors.  Michiel CowboySHANNON Laquashia Mergenthaler, PA-C  [facility] [address], [address] [place], [place] [address] [phone]

## 2016-05-26 ENCOUNTER — Telehealth: Payer: Self-pay

## 2016-05-26 LAB — CULTURE, URINE COMPREHENSIVE

## 2016-05-26 NOTE — Telephone Encounter (Signed)
Spoke with pt mother in reference to -ucx, CT, and cysto. Mother voiced understanding.

## 2016-05-26 NOTE — Telephone Encounter (Signed)
-----   Message from Harle BattiestShannon A McGowan, PA-C sent at 05/26/2016 12:45 PM EDT ----- Please let the patient know that his urine culture was negative.  He needs to go forward with the CT Urogram and cystoscopy.

## 2016-07-25 ENCOUNTER — Ambulatory Visit: Payer: Medicare Other | Admitting: Family Medicine

## 2016-07-29 ENCOUNTER — Other Ambulatory Visit: Payer: Self-pay | Admitting: Family Medicine

## 2016-07-30 NOTE — Telephone Encounter (Signed)
Patient requesting refill of Vitamin D to [facility].  

## 2016-10-20 ENCOUNTER — Telehealth: Payer: Self-pay | Admitting: Family Medicine

## 2016-10-20 DIAGNOSIS — G43009 Migraine without aura, not intractable, without status migrainosus: Secondary | ICD-10-CM

## 2016-10-20 DIAGNOSIS — I10 Essential (primary) hypertension: Secondary | ICD-10-CM

## 2016-10-21 NOTE — Telephone Encounter (Signed)
Appointment made for 11-11-16 °

## 2016-11-11 ENCOUNTER — Ambulatory Visit (INDEPENDENT_AMBULATORY_CARE_PROVIDER_SITE_OTHER): Payer: Medicare Other | Admitting: Family Medicine

## 2016-11-11 ENCOUNTER — Encounter: Payer: Self-pay | Admitting: Family Medicine

## 2016-11-11 VITALS — BP 138/88 | HR 99 | Temp 97.5°F | Resp 18 | Ht 64.0 in | Wt 157.0 lb

## 2016-11-11 DIAGNOSIS — R3129 Other microscopic hematuria: Secondary | ICD-10-CM | POA: Diagnosis not present

## 2016-11-11 DIAGNOSIS — Q057 Lumbar spina bifida without hydrocephalus: Secondary | ICD-10-CM

## 2016-11-11 DIAGNOSIS — N319 Neuromuscular dysfunction of bladder, unspecified: Secondary | ICD-10-CM

## 2016-11-11 DIAGNOSIS — N390 Urinary tract infection, site not specified: Secondary | ICD-10-CM | POA: Diagnosis not present

## 2016-11-11 DIAGNOSIS — G43009 Migraine without aura, not intractable, without status migrainosus: Secondary | ICD-10-CM | POA: Diagnosis not present

## 2016-11-11 DIAGNOSIS — E785 Hyperlipidemia, unspecified: Secondary | ICD-10-CM

## 2016-11-11 DIAGNOSIS — I10 Essential (primary) hypertension: Secondary | ICD-10-CM | POA: Diagnosis not present

## 2016-11-11 DIAGNOSIS — R739 Hyperglycemia, unspecified: Secondary | ICD-10-CM

## 2016-11-11 LAB — POCT URINALYSIS DIPSTICK
BILIRUBIN UA: NEGATIVE
Glucose, UA: NEGATIVE
KETONES UA: NEGATIVE
Nitrite, UA: NEGATIVE
PH UA: 6.5
Spec Grav, UA: 1.01
Urobilinogen, UA: 4

## 2016-11-11 MED ORDER — LISINOPRIL-HYDROCHLOROTHIAZIDE 20-25 MG PO TABS: 1.0000 | ORAL_TABLET | Freq: Every day | ORAL | 1 refills | Status: DC

## 2016-11-11 MED ORDER — METOPROLOL SUCCINATE ER 50 MG PO TB24: ORAL_TABLET | ORAL | 1 refills | Status: DC

## 2016-11-11 NOTE — Progress Notes (Signed)
Name: Juan Hodges   MRN: [id number]    DOB: 1975-11-23   Date:11/11/2016       Progress Note  Subjective  Chief Complaint  Chief Complaint  Patient presents with  . Medication Refill    6 month F/U  . Hypertension    Denies any symptoms  . Migraine    Has one every two weeks, takes medication and helps ease up symptoms.   . Vitamin D Deficiency     Still taking Vitamin D 2000 otc  . Urinary Tract Infection    Denies any symptoms    HPI  HTN: he is now on Lisinopril/hctz and metoprolol, bp has improved significantly, but I would like to have it lower, we will increase dose of HCTZ. He denies side effects.  No chest pain or palpitation   Vitamin D deficiency: he is back on otc supplementation   Hyperlipidemia: last labs were done 05/2016 was very hight at 186, but low ASCVD and no family history of early cardiac disease, he needs to have labs done again. No chest pain.   Migraine: he is on beta-blocker and nortriptyline. He has a migraine every other week, described as  throbbing, frontal, no phonophobia but has phonophobia. No nausea or vomiting, avoiding caffeine.    Hematuria and recurrent UTI: seen at [facility], he has neurogenic bladder and has self cath about 3-4 times daily, last urine culture negative, so he will go back for further evaluation  Patient Active Problem List   Diagnosis Date Noted  . H/O fracture of hip 01/01/2016  . Vitamin D deficiency 01/01/2016  . 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 Hypertension, benign 01/01/2016  . Neurogenic bladder 12/19/2009  . Migraine without aura and without status migrainosus, not intractable 11/09/2009  . Chronic urinary tract infection 08/27/2007    Past Surgical History:  Procedure Laterality Date  . FEMUR SURGERY     broken bone  . HIP SURGERY    . SPINAL FIXATION SURGERY      Family History  Problem Relation Age of Onset  . Hyperlipidemia Mother   . Hypertension Mother    . Kidney cancer Maternal Grandmother   . Kidney disease Neg Hx   . Prostate cancer Neg Hx     Social History   Social History  . Marital status: Single    Spouse name: N/A  . Number of children: N/A  . Years of education: N/A   Occupational History  . Not on file.   Social History Main Topics  . Smoking status: Never Smoker  . Smokeless tobacco: Never Used  . Alcohol use No  . Drug use: No  . Sexual activity: Not on file   Other Topics Concern  . Not on file   Social History Narrative  . No narrative on file     Current Outpatient Prescriptions:  .  Cholecalciferol (D3-1000) 1000 units capsule, Take by mouth., Disp: , Rfl:  .  metoprolol succinate (TOPROL-XL) 50 MG 24 hr tablet, TAKE 1 TABLET BY MOUTH DAILY WITH OR IMMEDIATELY FOLLOWING A MEAL, Disp: 90 tablet, Rfl: 1 .  nortriptyline (PAMELOR) 10 MG capsule, Take 1-2 capsules (10-20 mg total) by mouth at bedtime., Disp: 60 capsule, Rfl: 5 .  lisinopril-hydrochlorothiazide (PRINZIDE,ZESTORETIC) 20-25 MG tablet, Take 1 tablet by mouth daily., Disp: 90 tablet, Rfl: 1  No Known Allergies   ROS  Constitutional: Negative for fever, positive for  weight change.  Respiratory: Negative for cough and  shortness of breath.   Cardiovascular: Negative for chest pain or palpitations.  Gastrointestinal: Negative for abdominal pain, no bowel changes.  Musculoskeletal: Positive for gait problem no  joint swelling.  Skin: Negative for rash.  Neurological: Negative for dizziness, positive for intermittent  headache.  No other specific complaints in a complete review of systems (except as listed in HPI above).   Objective  Vitals:   11/11/16 1428  BP: 138/88  Pulse: 99  Resp: 18  Temp: 97.5 F (36.4 C)  TempSrc: Oral  SpO2: 98%  Weight: 157 lb (71.2 kg)  Height:  (1.626 m)    Body mass index is 26.95 kg/m.  Physical Exam  Constitutional: Patient appears well-developed and well-nourished. No distress.   HEENT: head atraumatic, normocephalic, pupils equal and reactive to light, neck supple, throat within normal limits Cardiovascular: Normal rate, regular rhythm and normal heart sounds. No murmur heard. No BLE edema. Pulmonary/Chest: Effort normal and breath sounds normal. No respiratory distress. Abdominal: Soft. There is no tenderness. Psychiatric: Patient has a normal mood and affect. behavior is normal. Judgment and thought content normal. Muscular Skeletal: wears braces because of spina bifida, leg atrophy. Ankle braces Neurological: decrease sensation of lower extremities   Recent Results (from the past 2160 hour(s))  POCT urinalysis dipstick     Status: Abnormal   Collection Time: 11/11/16  2:39 PM  Result Value Ref Range   Color, UA yellow    Clarity, UA cloudy    Glucose, UA neg    Bilirubin, UA neg    Ketones, UA neg    Spec Grav, UA 1.010    Blood, UA moderate    pH, UA 6.5    Protein, UA trace    Urobilinogen, UA 4.0    Nitrite, UA neg    Leukocytes, UA moderate (2+) (A) Negative     PHQ2/9: Depression screen [facility] 2/9 11/11/2016 03/18/2016 05/09/2015  Decreased Interest 0 0 0  Down, Depressed, Hopeless 0 0 0  PHQ - 2 Score 0 0 0     Fall Risk: Fall Risk  11/11/2016 03/18/2016 05/09/2015  Falls in the past year? No No No    Functional Status Survey: Is the patient deaf or have difficulty hearing?: No Does the patient have difficulty seeing, even when wearing glasses/contacts?: No Does the patient have difficulty concentrating, remembering, or making decisions?: No Does the patient have difficulty walking or climbing stairs?: No Does the patient have difficulty dressing or bathing?: No Does the patient have difficulty doing errands alone such as visiting a doctor's office or shopping?: No   Assessment & Plan  1. Hypertension, benign  - metoprolol succinate (TOPROL-XL) 50 MG 24 hr tablet; TAKE 1 TABLET BY MOUTH DAILY WITH OR IMMEDIATELY FOLLOWING A MEAL   Dispense: 90 tablet; Refill: 1 - lisinopril-hydrochlorothiazide (PRINZIDE,ZESTORETIC) 20-25 MG tablet; Take 1 tablet by mouth daily.  Dispense: 90 tablet; Refill: 1  2. Chronic urinary tract infection  - POCT urinalysis dipstick  3. Dyslipidemia  Discussed medication / statin but he would like to hold off for now  4. Hyperglycemia  He has stopped drinking sodas, sweets and cutting on carbohydrates, he lost some weight since last visit, continue the hard work  5. Migraine without aura and without status migrainosus, not intractable  - metoprolol succinate (TOPROL-XL) 50 MG 24 hr tablet; TAKE 1 TABLET BY MOUTH DAILY WITH OR IMMEDIATELY FOLLOWING A MEAL  Dispense: 90 tablet; Refill: 1  6. Other microscopic hematuria  - Ambulatory referral  to Urology  7. 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 Neurogenic bladder  Self  cath multiple times a day

## 2016-11-17 NOTE — Progress Notes (Signed)
11/18/2016 10:59 AM   Juan Hodges 1976/04/11 [id number]  Referring provider: Alba CoryKrichna Sowles, MD [address] [place], [place] [address]  Chief Complaint  Patient presents with  . Hematuria    follow up    HPI: [name] who presents today as a referral from Dr[facility] C[facility].  Background history Patient is [name] who presents today as a referral from Dr. Carlynn Hodges for recurrent UTI's.   Patient has spina bifida and a brain shunt who manages his neurogenic bladder with CIC.  He catheterizes himself up to six times daily.  He reports that he has had blood and pus on his urine dips and 2 documented infections.  Patient does not have any symptoms of a urinary tract infection. He stated he just wants his urine checked from time to time.  His baseline urinary symptoms are frequency, urgency, incontinence (wears one depends daily) and intermittency.  He has not had recent fevers, chills, nausea or vomiting.  He does experience a small amount of blood from time to time after CIC.  He completed a hematuria work up in 2014 with CT Urogram and cystoscopy.  He was found to have a stricture in the proximal bulb/membranous urethra about 12 f.   UO's orthotopic. Bladder was severely trabeculated with a small diverticuli in the dome.   He was scheduled for CT Urogram and cystoscopy in 06/2016, but patient did not follow through with the testing.    He states he has not seen any recent gross hematuria.  He was found to have a positive dip for blood at his PCP's office.  He has not had fevers, chills, nausea or vomiting.  His UA today > 30 WBC's and many bacteria.  This was a clean cath specimen.    PMH: Past Medical History:  Diagnosis Date  . Chronic UTI (urinary tract infection)   . HLD (hyperlipidemia)   . Hypertension   . Spina bifida [facility](HCC)     Surgical History: Past Surgical History:  Procedure Laterality Date  . FEMUR SURGERY     broken bone  .  HIP SURGERY    . SPINAL FIXATION SURGERY      Home Medications:  Allergies as of 11/18/2016   No Known Allergies     Medication List       Accurate as of 11/18/16 10:59 AM. Always use your most recent med list.          D3-1000 1000 units capsule Generic drug:  Cholecalciferol Take by mouth.   lisinopril-hydrochlorothiazide 20-25 MG tablet Commonly known as:  PRINZIDE,ZESTORETIC Take 1 tablet by mouth daily.   metoprolol succinate 50 MG 24 hr tablet Commonly known as:  TOPROL-XL TAKE 1 TABLET BY MOUTH DAILY WITH OR IMMEDIATELY FOLLOWING A MEAL   nortriptyline 10 MG capsule Commonly known as:  PAMELOR Take 1-2 capsules (10-20 mg total) by mouth at bedtime.   sulfamethoxazole-trimethoprim 800-160 MG tablet Commonly known as:  BACTRIM DS,SEPTRA DS Take 1 tablet by mouth every 12 (twelve) hours.       Allergies: No Known Allergies  Family History: Family History  Problem Relation Age of Onset  . Hyperlipidemia Mother   . Hypertension Mother   . Kidney cancer Maternal Grandmother   . Kidney disease Neg Hx   . Prostate cancer Neg Hx     Social History:  reports that he has never smoked. He has never used smokeless tobacco. He reports that  he does not drink alcohol or use drugs.  ROS: UROLOGY Frequent Urination?: No Hard to postpone urination?: No Burning/pain with urination?: No Get up at night to urinate?: No Leakage of urine?: No Urine stream starts and stops?: No Trouble starting stream?: No Do you have to strain to urinate?: No Blood in urine?: Yes Urinary tract infection?: Yes Sexually transmitted disease?: No Injury to kidneys or bladder?: No Painful intercourse?: No Weak stream?: No Erection problems?: No Penile pain?: No  Gastrointestinal Nausea?: No Vomiting?: No Indigestion/heartburn?: No Diarrhea?: No Constipation?: No  Constitutional Fever: No Night sweats?: No Weight loss?: No Fatigue?: No  Skin Skin rash/lesions?:  No Itching?: No  Eyes Blurred vision?: No Double vision?: No  Ears/Nose/Throat Sore throat?: No Sinus problems?: Yes  Hematologic/Lymphatic Swollen glands?: No Easy bruising?: No  Cardiovascular Leg swelling?: No Chest pain?: No  Respiratory Cough?: No Shortness of breath?: No  Endocrine Excessive thirst?: No  Musculoskeletal Back pain?: No Joint pain?: No  Neurological Headaches?: Yes Dizziness?: No  Psychologic Depression?: No Anxiety?: No  Physical Exam: BP (!) 161/121   Pulse 90   Ht 5\' 4"  (1.626 m)   Wt 160 lb (72.6 kg)   BMI 27.46 kg/m   Constitutional: Well nourished. Alert and oriented, No acute distress. HEENT: [place] AT, moist mucus membranes. Trachea midline, no masses. Cardiovascular: No clubbing, cyanosis, or edema. Respiratory: Normal respiratory effort, no increased work of breathing. GI: Abdomen is soft, non tender, non distended, no abdominal masses. Liver and spleen not palpable.  No hernias appreciated.  Stool sample for occult testing is not indicated.   GU: No CVA tenderness.  No bladder fullness or masses.  Patient with uncircumcised phallus. Foreskin easily retracted  Urethral meatus is patent.  No penile discharge. No penile lesions or rashes. Scrotum without lesions, cysts, rashes and/or edema.  Testicles are located scrotally bilaterally. No masses are appreciated in the testicles. Left and right epididymis are normal. Rectal: Patient with  normal sphincter tone. Anus and perineum without scarring or rashes. No rectal masses are appreciated. Prostate is approximately 45 grams, no nodules are appreciated. Seminal vesicles are normal. Skin: No rashes, bruises or suspicious lesions. Lymph: No cervical or inguinal adenopathy. Neurologic: Grossly intact, no focal deficits, moving all 4 extremities. Psychiatric: Normal mood and affect.  Laboratory Data: Lab Results  Component Value Date   WBC 4.9 05/01/2016   HCT 47.6 05/01/2016   MCV 86  05/01/2016   PLT 295 05/01/2016    Lab Results  Component Value Date   CREATININE 1.03 05/01/2016    Lab Results  Component Value Date   HGBA1C 5.8 (H) 05/01/2016    Lab Results  Component Value Date   TSH 1.680 05/09/2015       Component Value Date/Time   CHOL 265 (H) 05/01/2016 1528   HDL 64 05/01/2016 1528   LDLCALC 186 (H) 05/01/2016 1528    Lab Results  Component Value Date   AST 21 05/01/2016   Lab Results  Component Value Date   ALT 17 05/01/2016     Urinalysis > 30 WBC's.  Many bacteria.  See EPIC.    Assessment & Plan:    1. History of gross hematuria  - UA was negative for hematuria today - will hold hematuria work up at this time  - Urine culture - started Septra DS - will adjust if needed once sensitivities are available  - BUN + creatinine    - patient will contact us with any gross  hematuria  - will continue to monitor with yearly UA's  2. History of urethral stricture disease  - 18f stricture found on cystoscopy for which the scope easily bypassed  - patient CIC without difficulty  3. Neurogenic bladder  - see above   Return for pending urine culture.  These notes generated with voice recognition software. I apologize for typographical errors.  Michiel Cowboy, PA-C  [facility] [address], [address] [place], [place] [address] [phone]

## 2016-11-18 ENCOUNTER — Encounter: Payer: Self-pay | Admitting: Urology

## 2016-11-18 ENCOUNTER — Ambulatory Visit (INDEPENDENT_AMBULATORY_CARE_PROVIDER_SITE_OTHER): Payer: Medicare Other | Admitting: Urology

## 2016-11-18 VITALS — BP 145/104 | HR 82 | Ht 64.0 in | Wt 160.0 lb

## 2016-11-18 DIAGNOSIS — R310 Gross hematuria: Secondary | ICD-10-CM | POA: Diagnosis not present

## 2016-11-18 DIAGNOSIS — Z87448 Personal history of other diseases of urinary system: Secondary | ICD-10-CM | POA: Diagnosis not present

## 2016-11-18 DIAGNOSIS — N319 Neuromuscular dysfunction of bladder, unspecified: Secondary | ICD-10-CM | POA: Diagnosis not present

## 2016-11-18 LAB — MICROSCOPIC EXAMINATION
EPITHELIAL CELLS (NON RENAL): NONE SEEN /HPF (ref 0–10)
RBC, UA: NONE SEEN /hpf (ref 0–?)
WBC, UA: >30 /hpf — ABNORMAL HIGH (ref 0–?)

## 2016-11-18 LAB — URINALYSIS, COMPLETE
BILIRUBIN UA: NEGATIVE
Glucose, UA: NEGATIVE
Nitrite, UA: NEGATIVE
PH UA: 5.5 (ref 5.0–7.5)
SPEC GRAV UA: 1.025 (ref 1.005–1.030)
Urobilinogen, Ur: 1 mg/dL (ref 0.2–1.0)

## 2016-11-18 MED ORDER — SULFAMETHOXAZOLE-TRIMETHOPRIM 800-160 MG PO TABS: 1.0000 | ORAL_TABLET | Freq: Two times a day (BID) | ORAL | 0 refills | Status: DC

## 2016-11-19 LAB — BUN+CREAT
BUN/Creatinine Ratio: 15 (ref 9–20)
BUN: 17 mg/dL (ref 6–24)
Creatinine, Ser: 1.11 mg/dL (ref 0.76–1.27)
GFR calc non Af Amer: 82 mL/min/{1.73_m2} (ref >59)
GFR, EST AFRICAN AMERICAN: 95 mL/min/{1.73_m2} (ref >59)

## 2016-11-21 ENCOUNTER — Telehealth: Payer: Self-pay | Admitting: Urology

## 2016-11-21 NOTE — Telephone Encounter (Signed)
Spoke with pt mom, Pattricia Bossnnie, in reference to ucx results. Made aware ucx results are not back yet. Pattricia Bossnnie voiced understanding.

## 2016-11-21 NOTE — Telephone Encounter (Signed)
Mother of pt calling for results of urine from earlier this week.  Stated she was told someone would call her to tell her the results and what to do next.  Please follow up and advise. Thanks.

## 2016-11-23 LAB — CULTURE, URINE COMPREHENSIVE

## 2016-11-24 ENCOUNTER — Telehealth: Payer: Self-pay

## 2016-11-24 NOTE — Telephone Encounter (Signed)
Spoke with pt mother, Pattricia Bossnnie, in reference to ucx results and abx. Made Pattricia Bossnnie aware if s/s do no improve to RTC for a u/a and cx. Also reinforced with Pattricia Bossnnie way to help prevent future infections. Pattricia Bossnnie voiced understanding.

## 2016-11-24 NOTE — Telephone Encounter (Signed)
-----   Message from Harle BattiestShannon A McGowan, PA-C sent at 11/23/2016  8:12 PM EDT ----- Please notify the patient that his urine culture was positive for infection and the organism is sensitive to the Septra DS.  If he does not see improvement in his symptoms after the completion of the antibiotic we will need to repeat the urinalysis and urine culture. To help prevent further infections he should be taking a probiotic, taking cranberry tablets or drinking cranberry juice daily and taking vitamin C 1000 mg daily. We will need to see him at least yearly to check his urine to make sure there is no blood in the urine. If he should experience gross hematuria, he should contact the office as well as for symptoms of urinary tract infections.

## 2016-12-09 ENCOUNTER — Other Ambulatory Visit: Payer: Medicare Other

## 2016-12-09 DIAGNOSIS — N390 Urinary tract infection, site not specified: Secondary | ICD-10-CM | POA: Diagnosis not present

## 2016-12-09 LAB — URINALYSIS, COMPLETE
Bilirubin, UA: NEGATIVE
Glucose, UA: NEGATIVE
Ketones, UA: NEGATIVE
Nitrite, UA: NEGATIVE
PH UA: 5 (ref 5.0–7.5)
PROTEIN UA: NEGATIVE
Specific Gravity, UA: 1.025 (ref 1.005–1.030)
Urobilinogen, Ur: 0.2 mg/dL (ref 0.2–1.0)

## 2016-12-09 LAB — MICROSCOPIC EXAMINATION: WBC, UA: >30 /hpf — ABNORMAL HIGH (ref 0–?)

## 2016-12-11 LAB — CULTURE, URINE COMPREHENSIVE

## 2016-12-15 ENCOUNTER — Telehealth: Payer: Self-pay

## 2016-12-15 DIAGNOSIS — N390 Urinary tract infection, site not specified: Secondary | ICD-10-CM

## 2016-12-15 MED ORDER — AMOXICILLIN-POT CLAVULANATE 875-125 MG PO TABS: 1.0000 | ORAL_TABLET | Freq: Two times a day (BID) | ORAL | 0 refills | Status: AC

## 2016-12-15 NOTE — Telephone Encounter (Signed)
Spoke with pt mother in reference to +ucx. Made aware abx were sent to pharmacy. Mother voiced understanding.

## 2016-12-15 NOTE — Telephone Encounter (Signed)
-----   Message from Harle Battiest, PA-C sent at 12/11/2016  7:52 PM EDT ----- Patient has a +UCx.  They need to start Augmentin 875/125, one tablet twice daily for seven days.  They also need to take a probiotic with the antibiotic course.

## 2016-12-24 ENCOUNTER — Telehealth: Payer: Self-pay | Admitting: Urology

## 2016-12-24 DIAGNOSIS — N390 Urinary tract infection, site not specified: Secondary | ICD-10-CM

## 2016-12-24 MED ORDER — CLINDAMYCIN HCL 300 MG PO CAPS: 300.0000 mg | ORAL_CAPSULE | Freq: Three times a day (TID) | ORAL | 0 refills | Status: DC

## 2016-12-24 NOTE — Telephone Encounter (Signed)
Spoke with pt mother in reference to new abx. Made aware to stop augmentin and start new one. Mother voiced understanding.

## 2016-12-24 NOTE — Telephone Encounter (Signed)
Spoke with pt mother in reference to reaction to augmentin. Pt mother stated that pharmacist advised pt he would develop GI distress and to take with probiotic and/or food. Per mother she did not allow pt to start medication until the weekend, 12/20/16. Therefore pt has only had 3.5 days worth of abx and has been taking with probiotic. Mother described reaction to be severe nausea, diarrhea, pt is extremely weak and not able to work. Reinforced with mother for pt to stop medication. Made mother aware would seek further advise from [facility]. Mother voiced understanding.  Augmentin added to pt allergies as an intolerance.

## 2016-12-24 NOTE — Telephone Encounter (Signed)
Please have patient stop the Augmentin and start clindamycin 300 mg 3 times a day for 7 days.

## 2016-12-24 NOTE — Telephone Encounter (Signed)
Ms. Juan Hodges called office to make aware that [name] is having reaction, severe cramping and upset stomach with new medication.  Please call and advise. Thanks =)

## 2017-02-18 ENCOUNTER — Encounter: Payer: Self-pay | Admitting: Family Medicine

## 2017-02-18 ENCOUNTER — Ambulatory Visit (INDEPENDENT_AMBULATORY_CARE_PROVIDER_SITE_OTHER): Payer: Medicare Other | Admitting: Family Medicine

## 2017-02-18 VITALS — BP 134/86 | HR 98 | Temp 98.6°F | Resp 18 | Ht 64.0 in | Wt 157.8 lb

## 2017-02-18 DIAGNOSIS — N3001 Acute cystitis with hematuria: Secondary | ICD-10-CM

## 2017-02-18 DIAGNOSIS — Q057 Lumbar spina bifida without hydrocephalus: Secondary | ICD-10-CM | POA: Diagnosis not present

## 2017-02-18 DIAGNOSIS — N319 Neuromuscular dysfunction of bladder, unspecified: Secondary | ICD-10-CM

## 2017-02-18 LAB — POCT URINALYSIS DIPSTICK
BILIRUBIN UA: NEGATIVE
Glucose, UA: NEGATIVE
Ketones, UA: NEGATIVE
NITRITE UA: NEGATIVE
PH UA: 5 (ref 5.0–8.0)
Spec Grav, UA: 1.015 (ref 1.010–1.025)
UROBILINOGEN UA: 0.2 U/dL

## 2017-02-18 NOTE — Progress Notes (Addendum)
Name: Juan Hodges   MRN: [id number]    DOB: [birth year]   Date:02/18/2017       Progress Note  Subjective  Chief Complaint  Chief Complaint  Patient presents with  . Urinary Tract Infection    HPI  -PT presents with c/o ongoing UTI. He has been seen by urology x2, started Augmentin, but this gave him significant diarrhea to the point that he could not go to work or function. He was then switched to Clindamycin, but he never started this medication. Foul-smelling urine is patient's only symptom, and this has been ongoing. He denies back/flank/pelvic pain, no fevers/chills, lightheadedness, fatigue, nausea or vomiting. Diarrhea has since resolved.   -Mom is with patient and she requests referral to new urology practice with [facility] location and pt is in agreement - this will be placed today. Explained that diarrhea is a common side effect of Augmentin, and that the recommendation from [place], [place] to start a probiotic is also my recommendation.  -Discussed catheterization practices in great detail with patient and inherently elevated risk of infection due to needing to do this. Has neurogenic bladder related to Spina bifida and has to self-cath. Pt is understanding and agrees to use excellent personal hygiene going forward.  -Last culture on file is from 12/09/16 with [facility] and found: Staphylococcus epidermidis with resistance to Cipro, Penicillin, and Bactrim, intermediate for Levaquin  Patient Active Problem List   Diagnosis Date Noted  . H/O fracture of hip 01/01/2016  . Vitamin D deficiency 01/01/2016  . 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 Hypertension, benign 01/01/2016  . Neurogenic bladder 12/19/2009  . Migraine without aura and without status migrainosus, not intractable 11/09/2009  . Chronic urinary tract infection 08/27/2007    Social History  Substance Use Topics  . Smoking status: Never Smoker  . Smokeless tobacco: Never Used  . Alcohol  use No     Current Outpatient Prescriptions:  .  lisinopril-hydrochlorothiazide (PRINZIDE,ZESTORETIC) 20-25 MG tablet, Take 1 tablet by mouth daily., Disp: 90 tablet, Rfl: 1 .  metoprolol succinate (TOPROL-XL) 50 MG 24 hr tablet, TAKE 1 TABLET BY MOUTH DAILY WITH OR IMMEDIATELY FOLLOWING A MEAL, Disp: 90 tablet, Rfl: 1 .  Cholecalciferol (D3-1000) 1000 units capsule, Take by mouth., Disp: , Rfl:  .  clindamycin (CLEOCIN) 300 MG capsule, Take 1 capsule (300 mg total) by mouth 3 (three) times daily. (Patient not taking: Reported on 02/18/2017), Disp: 21 capsule, Rfl: 0 .  nortriptyline (PAMELOR) 10 MG capsule, Take 1-2 capsules (10-20 mg total) by mouth at bedtime. (Patient not taking: Reported on 02/18/2017), Disp: 60 capsule, Rfl: 5 .  sulfamethoxazole-trimethoprim (BACTRIM DS,SEPTRA DS) 800-160 MG tablet, Take 1 tablet by mouth every 12 (twelve) hours. (Patient not taking: Reported on 02/18/2017), Disp: 14 tablet, Rfl: 0  Allergies  Allergen Reactions  . Augmentin [Amoxicillin-Pot Clavulanate]     GI distress    ROS  Constitutional: Negative for fever or weight change.  Respiratory: Negative for cough and shortness of breath.   Cardiovascular: Negative for chest pain or palpitations.  Gastrointestinal: Negative for abdominal pain, no bowel changes.  GU: Negative for urgency, frequency. Positive for foul smelling urine. Musculoskeletal: Negative for gait problem or joint swelling.  Skin: Negative for rash.  Neurological: Negative for dizziness or headache.  No other specific complaints in a complete review of systems (except as listed in HPI above).  Objective  Vitals:   02/18/17 1530  BP: 134/86  Pulse: 98  Resp: 18  Temp: 98.6 F (37 C)  TempSrc: Oral  SpO2: 96%  Weight: 157 lb 12.8 oz (71.6 kg)  Height: 5\' 4"  (1.626 m)    Body mass index is 27.09 kg/m.  Nursing Note and Vital Signs reviewed.  Physical Exam  Constitutional: Patient appears well-developed and  well-nourished. Obese No distress.  HEENT: head atraumatic, normocephalic Cardiovascular: Normal rate, regular rhythm, S1/S2 present.  No murmur or rub heard. No BLE edema. Pulmonary/Chest: Effort normal and breath sounds clear. No respiratory distress or retractions. Abdominal: Soft and non-tender, bowel sounds present x4 quadrants.  No CVA tenderness Psychiatric: Patient has a normal mood and affect. behavior is normal. Judgment and thought content normal.  Recent Results (from the past 2160 hour(s))  Urinalysis, Complete     Status: Abnormal   Collection Time: 12/09/16  1:18 PM  Result Value Ref Range   Specific Gravity, UA 1.025 1.005 - 1.030   pH, UA 5.0 5.0 - 7.5   Color, UA Yellow Yellow   Appearance Ur Cloudy (A) Clear   Leukocytes, UA 2+ (A) Negative   Protein, UA Negative Negative/Trace   Glucose, UA Negative Negative   Ketones, UA Negative Negative   RBC, UA 1+ (A) Negative   Bilirubin, UA Negative Negative   Urobilinogen, Ur 0.2 0.2 - 1.0 mg/dL   Nitrite, UA Negative Negative   Microscopic Examination See below:   CULTURE, URINE COMPREHENSIVE     Status: Abnormal   Collection Time: 12/09/16  1:18 PM  Result Value Ref Range   Urine Culture, Comprehensive Final report (A)    Organism ID, Bacteria Comment (A)     Comment: Staphylococcus epidermidis 10,000-25,000 colony forming units per mL Based on resistance to penicillin and susceptibility to oxacillin this isolate would be susceptible to: * Penicillinase-stable penicillins; such as:     Cloxacillin     Dicloxacillin     Nafcillin * Beta-lactam/beta-lactamase inhibitor combinations; such as:     Amoxicillin-clavulanic acid     Ampicillin-sulbactam * Antistaphylococcal cephems; such as:     Cefaclor     Cefuroxime * Antistaphylococcal carbapenems; such as:     Imipenem     Meropenem    ANTIMICROBIAL SUSCEPTIBILITY Comment     Comment:       ** S = Susceptible; I = Intermediate; R = Resistant **                     P = Positive; N = Negative             MICS are expressed in micrograms per mL    Antibiotic                 RSLT#1    RSLT#2    RSLT#3    RSLT#4 Ciprofloxacin                  R Gentamicin                     S Levofloxacin                   I Linezolid                      S Nitrofurantoin                 S Oxacillin  S Penicillin                     R Quinupristin/Dalfopristin      S Rifampin                       S Tetracycline                   S Trimethoprim/Sulfa             R Vancomycin                     S   Microscopic Examination     Status: Abnormal   Collection Time: 12/09/16  1:18 PM  Result Value Ref Range   WBC, UA >30 (H) 0 - 5 /hpf   RBC, UA 3-10 (A) 0 - 2 /hpf   Epithelial Cells (non renal) 0-10 0 - 10 /hpf   Mucus, UA Present (A) Not Estab.   Bacteria, UA Few (A) None seen/Few  POCT urinalysis dipstick     Status: Abnormal   Collection Time: 02/18/17  3:37 PM  Result Value Ref Range   Color, UA yellow    Clarity, UA cloudy    Glucose, UA negative    Bilirubin, UA negative    Ketones, UA negative    Spec Grav, UA 1.015 1.010 - 1.025   Blood, UA large    pH, UA 5.0 5.0 - 8.0   Protein, UA trace    Urobilinogen, UA 0.2 0.2 or 1.0 E.U./dL   Nitrite, UA negative    Leukocytes, UA Large (3+) (A) Negative     Assessment & Plan  1. Acute cystitis with hematuria  - POCT urinalysis dipstick - Urine Culture  2. 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 Neurogenic bladder  - Ambulatory referral to Urology  - Patient is positive for Leukocytes, blood, and foul smell of urine. Advised that due to ongoing frequent UTI's, risk for resistance/previous resistant strains in urine, and no signs of systemic infection, we will await culture results prior to treating with antibiotics. Advised pt to start probiotic therapy now in anticipation of antibiotic therapy after culture is returned.  -Red  flags and when to present for emergency care or RTC including fever >101.74F, chest pain, shortness of breath, new/worsening/un-resolving symptoms, fever/chills, back/flank/abdominal pain, change in odor/appearance of urine reviewed with patient at time of visit. Follow up and care instructions discussed and provided in AVS.  I have reviewed this encounter including the documentation in this note and/or discussed this patient with the Deboraha Sprang, FNP, NP-C. I am certifying that I agree with the content of this note as supervising physician.  Alba Cory, MD [facility] 02/19/2017, 1:02 PM

## 2017-02-20 ENCOUNTER — Other Ambulatory Visit: Payer: Self-pay | Admitting: Family Medicine

## 2017-02-20 LAB — URINE CULTURE

## 2017-02-20 MED ORDER — SULFAMETHOXAZOLE-TRIMETHOPRIM 800-160 MG PO TABS: 1.0000 | ORAL_TABLET | Freq: Two times a day (BID) | ORAL | 0 refills | Status: DC

## 2017-04-27 ENCOUNTER — Encounter: Payer: Self-pay | Admitting: Family Medicine

## 2017-04-27 ENCOUNTER — Ambulatory Visit (INDEPENDENT_AMBULATORY_CARE_PROVIDER_SITE_OTHER): Payer: Medicare Other

## 2017-04-27 ENCOUNTER — Ambulatory Visit: Payer: Medicare Other | Admitting: Family Medicine

## 2017-04-27 ENCOUNTER — Ambulatory Visit (INDEPENDENT_AMBULATORY_CARE_PROVIDER_SITE_OTHER): Payer: Medicare Other | Admitting: Family Medicine

## 2017-04-27 VITALS — BP 132/84 | HR 92 | Temp 98.6°F | Ht 64.0 in | Wt 155.6 lb

## 2017-04-27 VITALS — BP 110/76 | HR 100 | Temp 98.5°F | Resp 16 | Wt 158.7 lb

## 2017-04-27 DIAGNOSIS — Z789 Other specified health status: Secondary | ICD-10-CM | POA: Diagnosis not present

## 2017-04-27 DIAGNOSIS — Z0000 Encounter for general adult medical examination without abnormal findings: Secondary | ICD-10-CM | POA: Diagnosis not present

## 2017-04-27 DIAGNOSIS — N390 Urinary tract infection, site not specified: Secondary | ICD-10-CM

## 2017-04-27 DIAGNOSIS — N319 Neuromuscular dysfunction of bladder, unspecified: Secondary | ICD-10-CM

## 2017-04-27 NOTE — Patient Instructions (Signed)
Juan Hodges , Thank you for taking time to come for your Medicare Wellness Visit. I appreciate your ongoing commitment to your health goals. Please review the following plan we discussed and let me know if I can assist you in the future.   Screening recommendations/referrals: Colonoscopy: N/A Recommended yearly ophthalmology/optometry visit for glaucoma screening and checkup Recommended yearly dental visit for hygiene and checkup  Vaccinations: Influenza vaccine: due fall 2018 Pneumococcal vaccine: N/A Tdap vaccine: declined Shingles vaccine: N/A  Advanced directives: Advance directive discussed with you today. Even though you declined this today please call our office should you change your mind and we can give you the proper paperwork for you to fill out.  Conditions/risks identified: Recommend to increase the amount of fruits and vegetables in daily diet to at least 1 serving of each a day.   Next appointment: 05/15/17  Preventive Care 40-64 Years, Male Preventive care refers to lifestyle choices and visits with your health care provider that can promote health and wellness. What does preventive care include?  A yearly physical exam. This is also called an annual well check.  Dental exams once or twice a year.  Routine eye exams. Ask your health care provider how often you should have your eyes checked.  Personal lifestyle choices, including:  Daily care of your teeth and gums.  Regular physical activity.  Eating a healthy diet.  Avoiding tobacco and drug use.  Limiting alcohol use.  Practicing safe sex.  Taking low-dose aspirin every day starting at age 32. What happens during an annual well check? The services and screenings done by your health care provider during your annual well check will depend on your age, overall health, lifestyle risk factors, and family history of disease. Counseling  Your health care provider may ask you questions about your:  Alcohol  use.  Tobacco use.  Drug use.  Emotional well-being.  Home and relationship well-being.  Sexual activity.  Eating habits.  Work and work Astronomer. Screening  You may have the following tests or measurements:  Height, weight, and BMI.  Blood pressure.  Lipid and cholesterol levels. These may be checked every 5 years, or more frequently if you are over 65 years old.  Skin check.  Lung cancer screening. You may have this screening every year starting at age 85 if you have a 30-pack-year history of smoking and currently smoke or have quit within the past 15 years.  Fecal occult blood test (FOBT) of the stool. You may have this test every year starting at age 40.  Flexible sigmoidoscopy or colonoscopy. You may have a sigmoidoscopy every 5 years or a colonoscopy every 10 years starting at age 70.  Prostate cancer screening. Recommendations will vary depending on your family history and other risks.  Hepatitis C blood test.  Hepatitis B blood test.  Sexually transmitted disease (STD) testing.  Diabetes screening. This is done by checking your blood sugar (glucose) after you have not eaten for a while (fasting). You may have this done every 1-3 years. Discuss your test results, treatment options, and if necessary, the need for more tests with your health care provider. Vaccines  Your health care provider may recommend certain vaccines, such as:  Influenza vaccine. This is recommended every year.  Tetanus, diphtheria, and acellular pertussis (Tdap, Td) vaccine. You may need a Td booster every 10 years.  Zoster vaccine. You may need this after age 51.  Pneumococcal 13-valent conjugate (PCV13) vaccine. You may need this if you have  certain conditions and have not been vaccinated.  Pneumococcal polysaccharide (PPSV23) vaccine. You may need one or two doses if you smoke cigarettes or if you have certain conditions. Talk to your health care provider about which screenings  and vaccines you need and how often you need them. This information is not intended to replace advice given to you by your health care provider. Make sure you discuss any questions you have with your health care provider. Document Released: 09/21/2015 Document Revised: 05/14/2016 Document Reviewed: 06/26/2015 Elsevier Interactive Patient Education  2017 [employer].  Fall Prevention in the Home Falls can cause injuries. They can happen to people of all ages. There are many things you can do to make your home safe and to help prevent falls. What can I do on the outside of my home?  Regularly fix the edges of walkways and driveways and fix any cracks.  Remove anything that might make you trip as you walk through a door, such as a raised step or threshold.  Trim any bushes or trees on the path to your home.  Use bright outdoor lighting.  Clear any walking paths of anything that might make someone trip, such as rocks or tools.  Regularly check to see if handrails are loose or broken. Make sure that both sides of any steps have handrails.  Any raised decks and porches should have guardrails on the edges.  Have any leaves, snow, or ice cleared regularly.  Use sand or salt on walking paths during winter.  Clean up any spills in your garage right away. This includes oil or grease spills. What can I do in the bathroom?  Use night lights.  Install grab bars by the toilet and in the tub and shower. Do not use towel bars as grab bars.  Use non-skid mats or decals in the tub or shower.  If you need to sit down in the shower, use a plastic, non-slip stool.  Keep the floor dry. Clean up any water that spills on the floor as soon as it happens.  Remove soap buildup in the tub or shower regularly.  Attach bath mats securely with double-sided non-slip rug tape.  Do not have throw rugs and other things on the floor that can make you trip. What can I do in the bedroom?  Use night  lights.  Make sure that you have a light by your bed that is easy to reach.  Do not use any sheets or blankets that are too big for your bed. They should not hang down onto the floor.  Have a firm chair that has side arms. You can use this for support while you get dressed.  Do not have throw rugs and other things on the floor that can make you trip. What can I do in the kitchen?  Clean up any spills right away.  Avoid walking on wet floors.  Keep items that you use a lot in easy-to-reach places.  If you need to reach something above you, use a strong step stool that has a grab bar.  Keep electrical cords out of the way.  Do not use floor polish or wax that makes floors slippery. If you must use wax, use non-skid floor wax.  Do not have throw rugs and other things on the floor that can make you trip. What can I do with my stairs?  Do not leave any items on the stairs.  Make sure that there are handrails on both sides of  the stairs and use them. Fix handrails that are broken or loose. Make sure that handrails are as long as the stairways.  Check any carpeting to make sure that it is firmly attached to the stairs. Fix any carpet that is loose or worn.  Avoid having throw rugs at the top or bottom of the stairs. If you do have throw rugs, attach them to the floor with carpet tape.  Make sure that you have a light switch at the top of the stairs and the bottom of the stairs. If you do not have them, ask someone to add them for you. What else can I do to help prevent falls?  Wear shoes that:  Do not have high heels.  Have rubber bottoms.  Are comfortable and fit you well.  Are closed at the toe. Do not wear sandals.  If you use a stepladder:  Make sure that it is fully opened. Do not climb a closed stepladder.  Make sure that both sides of the stepladder are locked into place.  Ask someone to hold it for you, if possible.  Clearly mark and make sure that you can  see:  Any grab bars or handrails.  First and last steps.  Where the edge of each step is.  Use tools that help you move around (mobility aids) if they are needed. These include:  Canes.  Walkers.  Scooters.  Crutches.  Turn on the lights when you go into a dark area. Replace any light bulbs as soon as they burn out.  Set up your furniture so you have a clear path. Avoid moving your furniture around.  If any of your floors are uneven, fix them.  If there are any pets around you, be aware of where they are.  Review your medicines with your doctor. Some medicines can make you feel dizzy. This can increase your chance of falling. Ask your doctor what other things that you can do to help prevent falls. This information is not intended to replace advice given to you by your health care provider. Make sure you discuss any questions you have with your health care provider. Document Released: 06/21/2009 Document Revised: 01/31/2016 Document Reviewed: 09/29/2014 Elsevier Interactive Patient Education  2017 [employer].

## 2017-04-27 NOTE — Progress Notes (Signed)
Subjective:   Juan Hodges is a 41 y.o. male who presents for an Initial Medicare Annual Wellness Visit.  Review of Systems  N/A Cardiac Risk Factors include: hypertension;male gender    Objective:    Today's Vitals   04/27/17 1505  BP: 132/84  Pulse: 92  Temp: 98.6 F (37 C)  TempSrc: Oral  Weight: 155 lb 9.6 oz (70.6 kg)  Height: 5\' 4"  (1.626 m)  PainSc: 0-No pain   Body mass index is 26.71 kg/m.  Current Medications (verified) Outpatient Encounter Prescriptions as of 04/27/2017  Medication Sig  . Cholecalciferol (D3-1000) 1000 units capsule Take by mouth.  Marland Kitchen lisinopril-hydrochlorothiazide (PRINZIDE,ZESTORETIC) 20-25 MG tablet Take 1 tablet by mouth daily.  . metoprolol succinate (TOPROL-XL) 50 MG 24 hr tablet TAKE 1 TABLET BY MOUTH DAILY WITH OR IMMEDIATELY FOLLOWING A MEAL  . nortriptyline (PAMELOR) 10 MG capsule Take 1-2 capsules (10-20 mg total) by mouth at bedtime. (Patient taking differently: Take 10-20 mg by mouth at bedtime. )  . [DISCONTINUED] sulfamethoxazole-trimethoprim (BACTRIM DS,SEPTRA DS) 800-160 MG tablet Take 1 tablet by mouth every 12 (twelve) hours.   No facility-administered encounter medications on file as of 04/27/2017.     Allergies (verified) Augmentin [amoxicillin-pot clavulanate]   History: Past Medical History:  Diagnosis Date  . Chronic UTI (urinary tract infection)   . HLD (hyperlipidemia)   . Hypertension   . Spina bifida [facility])    Past Surgical History:  Procedure Laterality Date  . FEMUR SURGERY     broken bone  . HIP SURGERY    . SPINAL FIXATION SURGERY     Family History  Problem Relation Age of Onset  . Hyperlipidemia Mother   . Hypertension Mother   . Kidney cancer Maternal Grandmother   . Kidney disease Neg Hx   . Prostate cancer Neg Hx    Social History   Occupational History  . Not on file.   Social History Main Topics  . Smoking status: Never Smoker  . Smokeless tobacco: Never Used  . Alcohol use No    . Drug use: No  . Sexual activity: Not on file   Tobacco Counseling Counseling given: Not Answered   Activities of Daily Living In your present state of health, do you have any difficulty performing the following activities: 04/27/2017 11/11/2016  Hearing? N N  Vision? N N  Difficulty concentrating or making decisions? N N  Walking or climbing stairs? Y N  Comment no trouble with braces, occasional pains -  Dressing or bathing? N N  Doing errands, shopping? Y N  Preparing Food and eating ? N -  Using the Toilet? N -  In the past six months, have you accidently leaked urine? Y -  Comment yes due to dx Spina bifida -  Do you have problems with loss of bowel control? Y -  Comment yes due to dx SB -  Managing your Medications? N -  Managing your Finances? N -  Housekeeping or managing your Housekeeping? N -  Some recent data might be hidden    Immunizations and Health Maintenance There is no immunization history for the selected administration types on file for this patient. Health Maintenance Due  Topic Date Due  . INFLUENZA VACCINE  04/08/2017    Patient Care Team: Alba Cory, MD as PCP - General (Family Medicine) Harle Battiest, PA-C as Physician Assistant (Urology)  Indicate any recent Medical Services you may have received from other than [facility] providers in the  past year (date may be approximate).    Assessment:   This is a routine wellness examination for Juan Hodges.   Hearing/Vision screen Vision Screening Comments: Pt does not see an eye doctor on a regular basis. No current complaints.   Dietary issues and exercise activities discussed: Current Exercise Habits: The patient has a physically strenous job, but has no regular exercise apart from work. 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 Eat more fruits and vegetables          Recommend to increase the amount of fruits and vegetables in daily diet to  at least 1 serving of each.       Depression Screen PHQ 2/9 Scores 04/27/2017 11/11/2016 03/18/2016 05/09/2015  PHQ - 2 Score 0 0 0 0    Fall Risk Fall Risk  04/27/2017 11/11/2016 03/18/2016 05/09/2015  Falls in the past year? No No No No    Cognitive Function: Declined screening today.         Screening Tests Health Maintenance  Topic Date Due  . INFLUENZA VACCINE  04/08/2017  . TETANUS/TDAP  06/25/2017 (Originally 07/30/1994)  . HIV Screening  Completed        Plan:  I have personally reviewed and addressed the Medicare Annual Wellness questionnaire and have noted the following in the patient's chart:  A. Medical and social history B. Use of alcohol, tobacco or illicit drugs  C. Current medications and supplements D. Functional ability and status E.  Nutritional status F.  Physical activity G. Advance directives H. List of other physicians I.  Hospitalizations, surgeries, and ER visits in previous 12 months J.  Vitals K. Screenings such as hearing and vision if needed, cognitive and depression L. Referrals and appointments - none  In addition, I have reviewed and discussed with patient certain preventive protocols, quality metrics, and best practice recommendations. A written personalized care plan for preventive services as well as general preventive health recommendations were provided to patient.  See attached scanned questionnaire for additional information.   I have reviewed this encounter including the documentation in this note and/or discussed this patient with the Deboraha Sprang, FNP, NP-C. I am certifying that I agree with the content of this note as supervising physician.  Alba Cory, MD [facility] 04/27/2017, 9:42 PM  Signed,  Hyacinth Meeker, LPN Nurse Health Advisor   MD Recommendations: None. Pt declined a tetanus vaccine today.

## 2017-04-27 NOTE — Progress Notes (Addendum)
Name: Juan Hodges   MRN: [id number]    DOB: [birth year]   Date:04/27/2017       Progress Note  Subjective  Chief Complaint  Chief Complaint  Patient presents with  . Follow-up    UTI, patient stated it never cleared up, no pain, has a odor    HPI  PT presents with foul smelling urine for 2 weeks, also slightly cloudier than usual. Patient denies fatigue, pain with self-catheterization, increase in frequency, no fevers/chills, nausea or vomiting, diarrhea or constipation.  We followed up on Urology Referral, and patient is now scheduled with [facility], Dr. Liliane Shi on 05/22/2017.  We will culture/micro urine today to test for infection and proceed from there.  No history of kidney stones.  Patient Active Problem List   Diagnosis Date Noted  . H/O fracture of hip 01/01/2016  . Vitamin D deficiency 01/01/2016  . 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 Hypertension, benign 01/01/2016  . Neurogenic bladder 12/19/2009  . Migraine without aura and without status migrainosus, not intractable 11/09/2009  . Chronic urinary tract infection 08/27/2007    Social History  Substance Use Topics  . Smoking status: Never Smoker  . Smokeless tobacco: Never Used  . Alcohol use No     Current Outpatient Prescriptions:  .  Cholecalciferol (D3-1000) 1000 units capsule, Take by mouth., Disp: , Rfl:  .  lisinopril-hydrochlorothiazide (PRINZIDE,ZESTORETIC) 20-25 MG tablet, Take 1 tablet by mouth daily., Disp: 90 tablet, Rfl: 1 .  metoprolol succinate (TOPROL-XL) 50 MG 24 hr tablet, TAKE 1 TABLET BY MOUTH DAILY WITH OR IMMEDIATELY FOLLOWING A MEAL, Disp: 90 tablet, Rfl: 1 .  nortriptyline (PAMELOR) 10 MG capsule, Take 1-2 capsules (10-20 mg total) by mouth at bedtime. (Patient taking differently: Take 10-20 mg by mouth at bedtime. ), Disp: 60 capsule, Rfl: 5  Allergies  Allergen Reactions  . Augmentin [Amoxicillin-Pot Clavulanate]     GI distress    ROS  Constitutional:  Negative for fever or weight change.  Respiratory: Negative for cough and shortness of breath.   Cardiovascular: Negative for chest pain or palpitations.  Gastrointestinal: Negative for abdominal pain, no bowel changes.  GU: See HPI Musculoskeletal: Negative for gait problem or joint swelling.  Skin: Negative for rash.  Neurological: Negative for dizziness or headache.  No other specific complaints in a complete review of systems (except as listed in HPI above).  Objective  Vitals:   04/27/17 1600  BP: 110/76  Pulse: 100  Resp: 16  Temp: 98.5 F (36.9 C)  TempSrc: Oral  SpO2: 97%  Weight: 158 lb 11.2 oz (72 kg)   Body mass index is 27.24 kg/m.  Nursing Note and Vital Signs reviewed.  Physical Exam  Constitutional: Patient appears well-developed and well-nourished. No distress.  HEENT: head atraumatic, normocephalic Cardiovascular: Normal rate, regular rhythm, S1/S2 present.  No murmur or rub heard. No BLE edema. Pulmonary/Chest: Effort normal and breath sounds clear. No respiratory distress or retractions. Abdominal: Soft and non-tender, bowel sounds present x4 quadrants.  No CVA Tenderness Psychiatric: Patient has a normal mood and affect. behavior is normal. Judgment and thought content normal.  Recent Results (from the past 2160 hour(s))  POCT urinalysis dipstick     Status: Abnormal   Collection Time: 02/18/17  3:37 PM  Result Value Ref Range   Color, UA yellow    Clarity, UA cloudy    Glucose, UA negative    Bilirubin, UA negative    Ketones, UA negative  Spec Grav, UA 1.015 1.010 - 1.025   Blood, UA large    pH, UA 5.0 5.0 - 8.0   Protein, UA trace    Urobilinogen, UA 0.2 0.2 or 1.0 E.U./dL   Nitrite, UA negative    Leukocytes, UA Large (3+) (A) Negative  Urine Culture     Status: None   Collection Time: 02/18/17  4:03 PM  Result Value Ref Range   Culture KLEBSIELLA PNEUMONIAE     Comment: SOURCE: URINE&URINE   Colony Count Greater than 100,000 CFU/mL     Organism ID, Bacteria KLEBSIELLA PNEUMONIAE       Susceptibility   Klebsiella pneumoniae -  (no method available)    AMPICILLIN >=32 Resistant     AMOX/CLAVULANIC <=2 Sensitive     AMPICILLIN/SULBACTAM 4 Sensitive     PIP/TAZO <=4 Sensitive     IMIPENEM <=0.25 Sensitive     CEFAZOLIN <=4 Not Reportable     CEFTRIAXONE <=1 Sensitive     CEFTAZIDIME <=1 Sensitive     CEFEPIME <=1 Sensitive     GENTAMICIN <=1 Sensitive     TOBRAMYCIN <=1 Sensitive     CIPROFLOXACIN <=0.25 Sensitive     LEVOFLOXACIN <=0.12 Sensitive     NITROFURANTOIN 64 Intermediate     TRIMETH/SULFA* <=20 Sensitive      * NR=NOT REPORTABLE,SEE COMMENTORAL therapy:A cefazolin MIC of <32 predicts susceptibility to the oral agents cefaclor,cefdinir,cefpodoxime,cefprozil,cefuroxime,cephalexin,and loracarbef when used for therapy of uncomplicated UTIs due to E.coli,K.pneumomiae,and P.mirabilis. PARENTERAL therapy: A cefazolinMIC of >8 indicates resistance to parenteralcefazolin. An alternate test method must beperformed to confirm susceptibility to parenteralcefazolin.     Assessment & Plan  1. Chronic urinary tract infection - Urine Culture - Urinalysis, microscopic only  2. Neurogenic bladder - Urine Culture - Urinalysis, microscopic only  3. Self-catheterizes urinary bladder - Urine Culture - Urinalysis, microscopic only  Attend Urology appointment on 05/22/2017.  -Red flags and when to present for emergency care or RTC reviewed with patient at time of visit. Follow up and care instructions discussed and provided in AVS.  I have reviewed this encounter including the documentation in this note and/or discussed this patient with the Deboraha Sprang, FNP, NP-C. I am certifying that I agree with the content of this note as supervising physician.  Alba Cory, MD [facility] 04/29/2017, 5:08 PM

## 2017-04-28 LAB — URINALYSIS, MICROSCOPIC ONLY
Casts: NONE SEEN [LPF]
Crystals: NONE SEEN [HPF]
Yeast: NONE SEEN [HPF]

## 2017-04-30 LAB — URINE CULTURE

## 2017-05-01 ENCOUNTER — Other Ambulatory Visit: Payer: Self-pay | Admitting: Family Medicine

## 2017-05-01 DIAGNOSIS — N3090 Cystitis, unspecified without hematuria: Secondary | ICD-10-CM

## 2017-05-01 MED ORDER — CIPROFLOXACIN HCL 250 MG PO TABS: 250.0000 mg | ORAL_TABLET | Freq: Two times a day (BID) | ORAL | 0 refills | Status: AC

## 2017-05-13 ENCOUNTER — Other Ambulatory Visit: Payer: Self-pay | Admitting: Family Medicine

## 2017-05-13 DIAGNOSIS — G43009 Migraine without aura, not intractable, without status migrainosus: Secondary | ICD-10-CM

## 2017-05-13 DIAGNOSIS — I10 Essential (primary) hypertension: Secondary | ICD-10-CM

## 2017-05-13 NOTE — Telephone Encounter (Signed)
Patient requesting refill of Lisinopril and Metoprolol to [facility].

## 2017-05-15 ENCOUNTER — Encounter: Payer: Medicare Other | Admitting: Family Medicine

## 2017-05-15 ENCOUNTER — Encounter: Payer: Self-pay | Admitting: Family Medicine

## 2017-05-15 ENCOUNTER — Ambulatory Visit (INDEPENDENT_AMBULATORY_CARE_PROVIDER_SITE_OTHER): Payer: Medicare Other | Admitting: Family Medicine

## 2017-05-15 VITALS — BP 118/78 | HR 106 | Temp 98.5°F | Resp 16 | Ht 64.0 in | Wt 156.6 lb

## 2017-05-15 DIAGNOSIS — Z23 Encounter for immunization: Secondary | ICD-10-CM | POA: Diagnosis not present

## 2017-05-15 DIAGNOSIS — R739 Hyperglycemia, unspecified: Secondary | ICD-10-CM

## 2017-05-15 DIAGNOSIS — G43009 Migraine without aura, not intractable, without status migrainosus: Secondary | ICD-10-CM

## 2017-05-15 DIAGNOSIS — E785 Hyperlipidemia, unspecified: Secondary | ICD-10-CM | POA: Diagnosis not present

## 2017-05-15 DIAGNOSIS — N319 Neuromuscular dysfunction of bladder, unspecified: Secondary | ICD-10-CM

## 2017-05-15 DIAGNOSIS — Q057 Lumbar spina bifida without hydrocephalus: Secondary | ICD-10-CM

## 2017-05-15 DIAGNOSIS — E559 Vitamin D deficiency, unspecified: Secondary | ICD-10-CM

## 2017-05-15 DIAGNOSIS — I10 Essential (primary) hypertension: Secondary | ICD-10-CM

## 2017-05-15 MED ORDER — METOPROLOL SUCCINATE ER 50 MG PO TB24: ORAL_TABLET | ORAL | 0 refills | Status: DC

## 2017-05-15 MED ORDER — LISINOPRIL-HYDROCHLOROTHIAZIDE 20-25 MG PO TABS: 1.0000 | ORAL_TABLET | Freq: Every day | ORAL | 0 refills | Status: DC

## 2017-05-15 MED ORDER — NORTRIPTYLINE HCL 10 MG PO CAPS: 10.0000 mg | ORAL_CAPSULE | Freq: Every day | ORAL | 1 refills | Status: DC

## 2017-05-15 NOTE — Progress Notes (Signed)
Name: Juan Hodges   MRN: [id number]    DOB: 09/14/1975   Date:05/15/2017       Progress Note  Subjective  Chief Complaint  Chief Complaint  Patient presents with  . Hypertension    6 month follow up no issues  . Migraine    meication has been helping    HPI   HTN: he is now on Lisinopril/hctz and metoprolol, bp is at goal.  No chest pain, dizziness  or palpitation   Vitamin D deficiency: he is back on otc supplementation   Hyperlipidemia: last labs were done 05/2016 was very hight at 186, but low ASCVD and no family history of early cardiac disease, he needs to have repeat labs. No chest pain.   Migraine: he is on beta-blocker and nortriptyline, however takes Nortriptyline only prn and has migraine about once a week, explained that he needs to take it qhs to prevent episodes and not prn. He describes it as throbbing, frontal, no phonophobia but has phonophobia. No nausea or vomiting, avoiding caffeine.    Neurogenic bladder/recurrent UTI: recently treated with Cipro, denies urine odor, fever or back pain. He has spina bifida, but is stable, uses a brace to assist with ambulation  Patient Active Problem List   Diagnosis Date Noted  . Self-catheterizes urinary bladder 04/27/2017  . H/O fracture of hip 01/01/2016  . Vitamin D deficiency 01/01/2016  . 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 Hypertension, benign 01/01/2016  . Neurogenic bladder 12/19/2009  . Migraine without aura and without status migrainosus, not intractable 11/09/2009  . Chronic urinary tract infection 08/27/2007    Past Surgical History:  Procedure Laterality Date  . FEMUR SURGERY     broken bone  . HIP SURGERY    . SPINAL FIXATION SURGERY      Family History  Problem Relation Age of Onset  . Hyperlipidemia Mother   . Hypertension Mother   . Kidney cancer Maternal Grandmother   . Kidney disease Neg Hx   . Prostate cancer Neg Hx     Social History   Social History  .  Marital status: Single    Spouse name: N/A  . Number of children: N/A  . Years of education: N/A   Occupational History  . Not on file.   Social History Main Topics  . Smoking status: Never Smoker  . Smokeless tobacco: Never Used  . Alcohol use No  . Drug use: No  . Sexual activity: Not on file   Other Topics Concern  . Not on file   Social History Narrative  . No narrative on file     Current Outpatient Prescriptions:  .  Cholecalciferol (D3-1000) 1000 units capsule, Take by mouth., Disp: , Rfl:  .  lisinopril-hydrochlorothiazide (PRINZIDE,ZESTORETIC) 20-25 MG tablet, Take 1 tablet by mouth daily., Disp: 90 tablet, Rfl: 0 .  metoprolol succinate (TOPROL-XL) 50 MG 24 hr tablet, TAKE 1 TABLET BY MOUTH DAILY WITH OR IMMEDIATELY FOLLOWING A MEAL, Disp: 90 tablet, Rfl: 0 .  nortriptyline (PAMELOR) 10 MG capsule, Take 1 capsule (10 mg total) by mouth at bedtime., Disp: 90 capsule, Rfl: 1  Allergies  Allergen Reactions  . Augmentin [Amoxicillin-Pot Clavulanate]     GI distress     ROS  Constitutional: Negative for fever or weight change.  Respiratory: Negative for cough and shortness of breath.   Cardiovascular: Negative for chest pain or palpitations.  Gastrointestinal: Negative for abdominal pain, no bowel changes.  Musculoskeletal: Positive  for gait problem , no joint swelling.  Skin: Negative for rash.  Neurological: Negative for dizziness , positive for intermittent  headache.  No other specific complaints in a complete review of systems (except as listed in HPI above).  Objective  Vitals:   05/15/17 1138  BP: 118/78  Pulse: (!) 106  Resp: 16  Temp: 98.5 F (36.9 C)  SpO2: 97%  Weight: 156 lb 9 oz (71 kg)  Height:  (1.626 m)    Body mass index is 26.87 kg/m.  Physical Exam  Constitutional: Patient appears well-developed and well-nourished. No distress.  HEENT: head atraumatic, normocephalic, pupils equal and reactive to light, neck supple,  throat within normal limits Cardiovascular: Normal rate, regular rhythm and normal heart sounds. No murmur heard. No BLE edema. Pulmonary/Chest: Effort normal and breath sounds normal. No respiratory distress. Abdominal: Soft. There is no tenderness. Psychiatric: Patient has a normal mood and affect. behavior is normal. Judgment and thought content normal. Muscular Skeletal: wears braces because of spina bifida, leg atrophy. Ankle braces Neurological: decrease sensation of lower extremities  Recent Results (from the past 2160 hour(s))  POCT urinalysis dipstick     Status: Abnormal   Collection Time: 02/18/17  3:37 PM  Result Value Ref Range   Color, UA yellow    Clarity, UA cloudy    Glucose, UA negative    Bilirubin, UA negative    Ketones, UA negative    Spec Grav, UA 1.015 1.010 - 1.025   Blood, UA large    pH, UA 5.0 5.0 - 8.0   Protein, UA trace    Urobilinogen, UA 0.2 0.2 or 1.0 E.U./dL   Nitrite, UA negative    Leukocytes, UA Large (3+) (A) Negative  Urine Culture     Status: None   Collection Time: 02/18/17  4:03 PM  Result Value Ref Range   Culture KLEBSIELLA PNEUMONIAE     Comment: SOURCE: URINE&URINE   Colony Count Greater than 100,000 CFU/mL    Organism ID, Bacteria KLEBSIELLA PNEUMONIAE       Susceptibility   Klebsiella pneumoniae -  (no method available)    AMPICILLIN >=32 Resistant     AMOX/CLAVULANIC <=2 Sensitive     AMPICILLIN/SULBACTAM 4 Sensitive     PIP/TAZO <=4 Sensitive     IMIPENEM <=0.25 Sensitive     CEFAZOLIN <=4 Not Reportable     CEFTRIAXONE <=1 Sensitive     CEFTAZIDIME <=1 Sensitive     CEFEPIME <=1 Sensitive     GENTAMICIN <=1 Sensitive     TOBRAMYCIN <=1 Sensitive     CIPROFLOXACIN <=0.25 Sensitive     LEVOFLOXACIN <=0.12 Sensitive     NITROFURANTOIN 64 Intermediate     TRIMETH/SULFA* <=20 Sensitive      * NR=NOT REPORTABLE,SEE COMMENTORAL therapy:A cefazolin MIC of <32 predicts susceptibility to the oral agents  cefaclor,cefdinir,cefpodoxime,cefprozil,cefuroxime,cephalexin,and loracarbef when used for therapy of uncomplicated UTIs due to E.coli,K.pneumomiae,and P.mirabilis. PARENTERAL therapy: A cefazolinMIC of >8 indicates resistance to parenteralcefazolin. An alternate test method must beperformed to confirm susceptibility to parenteralcefazolin.  Urinalysis, microscopic only     Status: Abnormal   Collection Time: 04/27/17  4:15 PM  Result Value Ref Range   WBC, UA PACKED (A) <=5 WBC/HPF   RBC / HPF 3-10 (A) <=2 RBC/HPF   Squamous Epithelial / LPF 0-5 <=5 HPF   Bacteria, UA FEW (A) NONE SEEN HPF   Crystals NONE SEEN NONE SEEN HPF   Casts NONE SEEN NONE SEEN LPF  Yeast NONE SEEN NONE SEEN HPF  Urine Culture     Status: None   Collection Time: 04/27/17  4:33 PM  Result Value Ref Range   Culture KLEBSIELLA PNEUMONIAE     Comment: SOURCE: URINE&URINE   Colony Count Greater than 100,000 CFU/mL    Organism ID, Bacteria KLEBSIELLA PNEUMONIAE       Susceptibility   Klebsiella pneumoniae -  (no method available)    AMPICILLIN  Resistant     AMOX/CLAVULANIC <=2 Sensitive     AMPICILLIN/SULBACTAM <=2 Sensitive     PIP/TAZO <=4 Sensitive     IMIPENEM <=0.25 Sensitive     CEFAZOLIN <=4 Not Reportable     CEFTRIAXONE <=1 Sensitive     CEFTAZIDIME <=1 Sensitive     CEFEPIME <=1 Sensitive     GENTAMICIN <=1 Sensitive     TOBRAMYCIN <=1 Sensitive     CIPROFLOXACIN <=0.25 Sensitive     LEVOFLOXACIN <=0.12 Sensitive     NITROFURANTOIN 64 Intermediate     TRIMETH/SULFA* <=20 Sensitive      * NR=NOT REPORTABLE,SEE COMMENTORAL therapy:A cefazolin MIC of <32 predicts susceptibility to the oral agents cefaclor,cefdinir,cefpodoxime,cefprozil,cefuroxime,cephalexin,and loracarbef when used for therapy of uncomplicated UTIs due to E.coli,K.pneumomiae,and P.mirabilis. PARENTERAL therapy: A cefazolinMIC of >8 indicates resistance to parenteralcefazolin. An alternate test method must beperformed to confirm  susceptibility to parenteralcefazolin.      PHQ2/9: Depression screen [facility] 2/9 04/27/2017 11/11/2016 03/18/2016 05/09/2015  Decreased Interest 0 0 0 0  Down, Depressed, Hopeless 0 0 0 0  PHQ - 2 Score 0 0 0 0     Fall Risk: Fall Risk  04/27/2017 11/11/2016 03/18/2016 05/09/2015  Falls in the past year? No No No No     Assessment & Plan  1. Neurogenic bladder  He has recurrent UTI's and has follow up with Urologist coming up  2. Hypertension, benign  - metoprolol succinate (TOPROL-XL) 50 MG 24 hr tablet; TAKE 1 TABLET BY MOUTH DAILY WITH OR IMMEDIATELY FOLLOWING A MEAL  Dispense: 90 tablet; Refill: 0 - lisinopril-hydrochlorothiazide (PRINZIDE,ZESTORETIC) 20-25 MG tablet; Take 1 tablet by mouth daily.   Dispense: 90 tablet; Refill: 0 -EKG - normal  - COMPLETE METABOLIC PANEL WITH GFR - CBC with Differential/Platelet  3. Migraine without aura and without status migrainosus, not intractable  - metoprolol succinate (TOPROL-XL) 50 MG 24 hr tablet; TAKE 1 TABLET BY MOUTH DAILY WITH OR IMMEDIATELY FOLLOWING A MEAL  Dispense: 90 tablet; Refill: 0 - nortriptyline (PAMELOR) 10 MG capsule; Take 1 capsule (10 mg total) by mouth at bedtime.  Dispense: 90 capsule; Refill: 1  4. Vitamin D deficiency  - VITAMIN D 25 Hydroxy (Vit-D Deficiency, Fractures)  5. Dyslipidemia  - Lipid panel  6. Hyperglycemia  - Hemoglobin A1c - Insulin, fasting  7. 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 Needs flu shot  refused

## 2017-05-16 LAB — COMPLETE METABOLIC PANEL WITH GFR
AG Ratio: 1.5 (calc) (ref 1.0–2.5)
ALBUMIN MSPROF: 4.5 g/dL (ref 3.6–5.1)
ALKALINE PHOSPHATASE (APISO): 64 U/L (ref 40–115)
ALT: 16 U/L (ref 9–46)
AST: 18 U/L (ref 10–40)
BUN: 22 mg/dL (ref 7–25)
CO2: 30 mmol/L (ref 20–32)
CREATININE: 0.98 mg/dL (ref 0.60–1.35)
Calcium: 9.9 mg/dL (ref 8.6–10.3)
Chloride: 100 mmol/L (ref 98–110)
GFR, Est African American: 111 mL/min/{1.73_m2} (ref >=60)
GFR, Est Non African American: 95 mL/min/{1.73_m2} (ref >=60)
GLUCOSE: 99 mg/dL (ref 65–99)
Globulin: 3 g/dL (calc) (ref 1.9–3.7)
Potassium: 3.7 mmol/L (ref 3.5–5.3)
Sodium: 139 mmol/L (ref 135–146)
Total Bilirubin: 0.7 mg/dL (ref 0.2–1.2)
Total Protein: 7.5 g/dL (ref 6.1–8.1)

## 2017-05-16 LAB — LIPID PANEL
CHOL/HDL RATIO: 3.9 (calc) (ref <5.0)
Cholesterol: 289 mg/dL — ABNORMAL HIGH (ref <200)
HDL: 74 mg/dL (ref >40)
LDL Cholesterol (Calc): 200 mg/dL (calc) — ABNORMAL HIGH
NON-HDL CHOLESTEROL (CALC): 215 mg/dL — AB (ref <130)
Triglycerides: 57 mg/dL (ref <150)

## 2017-05-16 LAB — CBC WITH DIFFERENTIAL/PLATELET
BASOS ABS: 29 {cells}/uL (ref 0–200)
BASOS PCT: 0.6 %
EOS ABS: 59 {cells}/uL (ref 15–500)
Eosinophils Relative: 1.2 %
HEMATOCRIT: 49.6 % (ref 38.5–50.0)
Hemoglobin: 16.8 g/dL (ref 13.2–17.1)
LYMPHS ABS: 1671 {cells}/uL (ref 850–3900)
MCH: 28.7 pg (ref 27.0–33.0)
MCHC: 33.9 g/dL (ref 32.0–36.0)
MCV: 84.6 fL (ref 80.0–100.0)
MPV: 9.7 fL (ref 7.5–12.5)
Monocytes Relative: 7.4 %
NEUTROS ABS: 2778 {cells}/uL (ref 1500–7800)
Neutrophils Relative %: 56.7 %
Platelets: 310 10*3/uL (ref 140–400)
RBC: 5.86 10*6/uL — ABNORMAL HIGH (ref 4.20–5.80)
RDW: 12.5 % (ref 11.0–15.0)
Total Lymphocyte: 34.1 %
WBC mixed population: 363 cells/uL (ref 200–950)
WBC: 4.9 10*3/uL (ref 3.8–10.8)

## 2017-05-16 LAB — HEMOGLOBIN A1C
Hgb A1c MFr Bld: 5.5 % of total Hgb (ref <5.7)
MEAN PLASMA GLUCOSE: 111 (calc)
eAG (mmol/L): 6.2 (calc)

## 2017-05-16 LAB — VITAMIN D 25 HYDROXY (VIT D DEFICIENCY, FRACTURES): Vit D, 25-Hydroxy: 18 ng/mL — ABNORMAL LOW (ref 30–100)

## 2017-05-18 LAB — INSULIN, FASTING: INSULIN: 14.2 u[IU]/mL (ref 2.0–19.6)

## 2017-05-29 ENCOUNTER — Telehealth: Payer: Self-pay | Admitting: Family Medicine

## 2017-05-29 ENCOUNTER — Other Ambulatory Visit: Payer: Self-pay

## 2017-05-29 MED ORDER — VITAMIN D (ERGOCALCIFEROL) 1.25 MG (50000 UNIT) PO CAPS: 50000.0000 [IU] | ORAL_CAPSULE | ORAL | 0 refills | Status: DC

## 2017-05-29 NOTE — Telephone Encounter (Signed)
Pt is requesting his lab results please return call [phone]. He would also like a copy of the results printed and his mom will pick them up.

## 2017-05-29 NOTE — Telephone Encounter (Signed)
Patient wanted to ask about his Crestor medication, since it is not at his pharmacy. Will this medication mess up his kidneys or liver?

## 2017-05-29 NOTE — Progress Notes (Unsigned)
Patient labs showed abnormal Vitamin D and Dr. Carlynn Purl verbalized to send Vitamin D high dose to pharmacy.

## 2017-05-31 ENCOUNTER — Other Ambulatory Visit: Payer: Self-pay | Admitting: Family Medicine

## 2017-05-31 MED ORDER — ROSUVASTATIN CALCIUM 10 MG PO TABS: 10.0000 mg | ORAL_TABLET | Freq: Every day | ORAL | 3 refills | Status: DC

## 2017-05-31 NOTE — Telephone Encounter (Signed)
Crestor is metabolized by the liver and it is rare to cause liver or kidney dysfunction but we do repeat labs within  3 months

## 2017-06-01 NOTE — Telephone Encounter (Signed)
Patient notified and will pick up prescription at pharmacy.

## 2017-06-01 NOTE — Telephone Encounter (Signed)
Prior authorization for Crestor was approved. Effective Date: 09/06/2016 Expiration Date: 09/07/2017.  Pharmacy was notified on 06/01/2017 and they will fill prescription and notify patient when it is ready.

## 2017-06-02 ENCOUNTER — Other Ambulatory Visit: Payer: Self-pay

## 2017-06-02 MED ORDER — ATORVASTATIN CALCIUM 40 MG PO TABS: 40.0000 mg | ORAL_TABLET | Freq: Every day | ORAL | 0 refills | Status: DC

## 2017-07-03 DIAGNOSIS — Q059 Spina bifida, unspecified: Secondary | ICD-10-CM | POA: Diagnosis not present

## 2017-07-03 DIAGNOSIS — R3914 Feeling of incomplete bladder emptying: Secondary | ICD-10-CM | POA: Diagnosis not present

## 2017-07-24 DIAGNOSIS — N3289 Other specified disorders of bladder: Secondary | ICD-10-CM | POA: Diagnosis not present

## 2017-07-24 DIAGNOSIS — R079 Chest pain, unspecified: Secondary | ICD-10-CM | POA: Diagnosis not present

## 2017-07-24 DIAGNOSIS — K6389 Other specified diseases of intestine: Secondary | ICD-10-CM | POA: Diagnosis not present

## 2017-08-11 ENCOUNTER — Other Ambulatory Visit: Payer: Self-pay | Admitting: Family Medicine

## 2017-08-11 DIAGNOSIS — I10 Essential (primary) hypertension: Secondary | ICD-10-CM

## 2017-08-11 NOTE — Telephone Encounter (Signed)
Hypertension medication request: Lisinopril-HCTZ  Last office visit pertaining to hypertension: 05/15/2017  BP Readings from Last 3 Encounters:  05/15/17 118/78  04/27/17 110/76  04/27/17 132/84    Lab Results  Component Value Date   CREATININE 0.98 05/15/2017   BUN 22 05/15/2017   NA 139 05/15/2017   K 3.7 05/15/2017   CL 100 05/15/2017   CO2 30 05/15/2017     Follow up in 6 month 11/20/2017

## 2017-08-14 DIAGNOSIS — R3914 Feeling of incomplete bladder emptying: Secondary | ICD-10-CM | POA: Diagnosis not present

## 2017-10-06 ENCOUNTER — Other Ambulatory Visit: Payer: Self-pay | Admitting: Family Medicine

## 2017-10-06 DIAGNOSIS — I10 Essential (primary) hypertension: Secondary | ICD-10-CM

## 2017-10-06 NOTE — Telephone Encounter (Signed)
Refill request for Hypertension medication:  Lisinopril-Hydrochlorothiazide 20-25 mg  Last office visit pertaining to hypertension: 05/15/2017  BP Readings from Last 3 Encounters:  05/15/17 118/78  04/27/17 110/76  04/27/17 132/84    Lab Results  Component Value Date   CREATININE 0.98 05/15/2017   BUN 22 05/15/2017   NA 139 05/15/2017   K 3.7 05/15/2017   CL 100 05/15/2017   CO2 30 05/15/2017   Follow-up on file. 11/20/2017

## 2017-11-19 ENCOUNTER — Other Ambulatory Visit: Payer: Self-pay | Admitting: Family Medicine

## 2017-11-19 DIAGNOSIS — I10 Essential (primary) hypertension: Secondary | ICD-10-CM

## 2017-11-19 DIAGNOSIS — G43009 Migraine without aura, not intractable, without status migrainosus: Secondary | ICD-10-CM

## 2017-11-19 NOTE — Telephone Encounter (Signed)
Refill request for Hypertension medication:  Metoprolol 50 mcg  Last office visit pertaining to hypertension: 05/15/2017  BP Readings from Last 3 Encounters:  05/15/17 118/78  04/27/17 110/76  04/27/17 132/84    Lab Results  Component Value Date   CREATININE 0.98 05/15/2017   BUN 22 05/15/2017   NA 139 05/15/2017   K 3.7 05/15/2017   CL 100 05/15/2017   CO2 30 05/15/2017    Follow-up on file. Appointment tomorrow.

## 2017-11-20 ENCOUNTER — Other Ambulatory Visit: Payer: Self-pay | Admitting: Family Medicine

## 2017-11-20 ENCOUNTER — Encounter: Payer: Self-pay | Admitting: Family Medicine

## 2017-11-20 ENCOUNTER — Ambulatory Visit (INDEPENDENT_AMBULATORY_CARE_PROVIDER_SITE_OTHER): Payer: Medicare Other | Admitting: Family Medicine

## 2017-11-20 VITALS — BP 140/90 | HR 101 | Resp 14 | Ht 64.0 in | Wt 165.5 lb

## 2017-11-20 DIAGNOSIS — N319 Neuromuscular dysfunction of bladder, unspecified: Secondary | ICD-10-CM | POA: Diagnosis not present

## 2017-11-20 DIAGNOSIS — G43009 Migraine without aura, not intractable, without status migrainosus: Secondary | ICD-10-CM | POA: Diagnosis not present

## 2017-11-20 DIAGNOSIS — Z789 Other specified health status: Secondary | ICD-10-CM | POA: Diagnosis not present

## 2017-11-20 DIAGNOSIS — E7800 Pure hypercholesterolemia, unspecified: Secondary | ICD-10-CM | POA: Diagnosis not present

## 2017-11-20 DIAGNOSIS — Q057 Lumbar spina bifida without hydrocephalus: Secondary | ICD-10-CM | POA: Diagnosis not present

## 2017-11-20 DIAGNOSIS — I10 Essential (primary) hypertension: Secondary | ICD-10-CM

## 2017-11-20 DIAGNOSIS — Z23 Encounter for immunization: Secondary | ICD-10-CM | POA: Diagnosis not present

## 2017-11-20 MED ORDER — VALSARTAN-HYDROCHLOROTHIAZIDE 320-25 MG PO TABS: 1.0000 | ORAL_TABLET | Freq: Every day | ORAL | 0 refills | Status: DC

## 2017-11-20 MED ORDER — METOPROLOL SUCCINATE ER 50 MG PO TB24: ORAL_TABLET | ORAL | 0 refills | Status: DC

## 2017-11-20 MED ORDER — ROSUVASTATIN CALCIUM 10 MG PO TABS: 10.0000 mg | ORAL_TABLET | Freq: Every day | ORAL | 1 refills | Status: DC

## 2017-11-20 MED ORDER — NORTRIPTYLINE HCL 10 MG PO CAPS: 10.0000 mg | ORAL_CAPSULE | Freq: Every day | ORAL | 1 refills | Status: DC

## 2017-11-20 NOTE — Progress Notes (Signed)
Name: Juan Hodges   MRN: [id number]    DOB: 1976/03/07   Date:11/20/2017       Progress Note  Subjective  Chief Complaint  Chief Complaint  Patient presents with  . Hypertension  . Migraine  . vitamin d    HPI  HTN: he is now on Lisinopril/hctz and metoprolol, bp is at goal. No chest pain, dizziness  or palpitation   Vitamin D deficiency: he is back on otc supplementation   Hyperlipidemia: last labs were done 05/2016 was very hight at 186, but low ASCVD and no family history of early cardiac disease, he needs to have repeat labs. No chest pain.   Migraine: he is on beta-blocker and nortriptyline, however takes Nortriptyline prn still,  and has migraine about once a week, explained that he needs to take it qhs to prevent episodes and not prn. He describes it as throbbing, frontal, no phonophobia but has phonophobia. No nausea or vomiting, he is only drinking caffeine when he has a headache, prior to taking otc medication   Neurogenic bladder/recurrent UTI:/spina bifida: seeing Urologist now, he is now taking Cephalexin prn symptoms. He states that symptoms usually consists of strong odor, still self cath at least 3 times per day. Seeing Dr. Sande Brothers now. Gait is stable  Patient Active Problem List   Diagnosis Date Noted  . Self-catheterizes urinary bladder 04/27/2017  . H/O fracture of hip 01/01/2016  . Vitamin D deficiency 01/01/2016  . 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 Hypertension, benign 01/01/2016  . Neurogenic bladder 12/19/2009  . Migraine without aura and without status migrainosus, not intractable 11/09/2009  . Chronic urinary tract infection 08/27/2007    Past Surgical History:  Procedure Laterality Date  . FEMUR SURGERY     broken bone  . HIP SURGERY    . SPINAL FIXATION SURGERY      Family History  Problem Relation Age of Onset  . Hyperlipidemia Mother   . Hypertension Mother   . Kidney cancer Maternal Grandmother   . Kidney  disease Neg Hx   . Prostate cancer Neg Hx     Social History   Socioeconomic History  . Marital status: Single    Spouse name: Not on file  . Number of children: Not on file  . Years of education: Not on file  . Highest education level: Not on file  Social Needs  . Financial resource strain: Not on file  . Food insecurity - worry: Not on file  . Food insecurity - inability: Not on file  . Transportation needs - medical: Not on file  . Transportation needs - non-medical: Not on file  Occupational History  . Not on file  Tobacco Use  . Smoking status: Never Smoker  . Smokeless tobacco: Never Used  Substance and Sexual Activity  . Alcohol use: No    Alcohol/week: 0.0 oz  . Drug use: No  . Sexual activity: Not on file  Other Topics Concern  . Not on file  Social History Narrative  . Not on file     Current Outpatient Medications:  .  Cholecalciferol (D3-1000) 1000 units capsule, Take by mouth., Disp: , Rfl:  .  metoprolol succinate (TOPROL-XL) 50 MG 24 hr tablet, TAKE 1 TABLET BY MOUTH DAILY WITH OR IMMEDIATELY FOLLOWING A MEAL, Disp: 90 tablet, Rfl: 0 .  nortriptyline (PAMELOR) 10 MG capsule, Take 1 capsule (10 mg total) by mouth at bedtime., Disp: 90 capsule, Rfl: 1 .  rosuvastatin (CRESTOR) 10 MG tablet, Take 1 tablet (10 mg total) by mouth daily., Disp: 90 tablet, Rfl: 1 .  valsartan-hydrochlorothiazide (DIOVAN-HCT) 320-25 MG tablet, Take 1 tablet by mouth daily. In place of lisinopril hctz for BP, Disp: 90 tablet, Rfl: 0  Allergies  Allergen Reactions  . Augmentin [Amoxicillin-Pot Clavulanate]     GI distress     ROS  Constitutional: Negative for fever or weight change.  Respiratory: Negative for cough and shortness of breath.   Cardiovascular: Negative for chest pain or palpitations.  Gastrointestinal: Negative for abdominal pain, no bowel changes.  Musculoskeletal: Positive  for gait problem but no  joint swelling.  Skin: Negative for rash.  Neurological:  Negative for dizziness or headache.  No other specific complaints in a complete review of systems (except as listed in HPI above).  Objective  Vitals:   11/20/17 1050  BP: 140/90  Pulse: (!) 101  Resp: 14  SpO2: 97%  Weight: 165 lb 8 oz (75.1 kg)  Height: 5\' 4"  (1.626 m)    Body mass index is 28.41 kg/m.  Physical Exam   Constitutional: Patient appears well-developed and well-nourished. No distress.  HEENT: head atraumatic, normocephalic, pupils equal and reactive to light, neck supple, throat within normal limits Cardiovascular: Normal rate, regular rhythm and normal heart sounds. No murmur heard. No BLE edema. Pulmonary/Chest: Effort normal and breath sounds normal. No respiratory distress. Abdominal: Soft. There is no tenderness. Psychiatric: Patient has a normal mood and affect. behavior is normal. Judgment and thought content normal. Muscular Skeletal: wears braces because of spina bifida, leg atrophy. Ankle braces Neurological: decrease sensation of lower extremities   PHQ2/9: Depression screen [facility] 04/27/2017 11/11/2016 03/18/2016 05/09/2015  Decreased Interest 0 0 0 0  Down, Depressed, Hopeless 0 0 0 0  PHQ - 2 Score 0 0 0 0    Fall Risk: Fall Risk  11/20/2017 04/27/2017 11/11/2016 03/18/2016 05/09/2015  Falls in the past year? No No No No No    Functional Status Survey: Is the patient deaf or have difficulty hearing?: No Does the patient have difficulty seeing, even when wearing glasses/contacts?: No Does the patient have difficulty concentrating, remembering, or making decisions?: No Does the patient have difficulty walking or climbing stairs?: No Does the patient have difficulty dressing or bathing?: No Does the patient have difficulty doing errands alone such as visiting a doctor's office or shopping?: No    Assessment & Plan  1. Hypertension, benign  bp has been elevated, we will change from lisinopril hctz to valsartan hctz and monitor  -  valsartan-hydrochlorothiazide (DIOVAN-HCT) 320-25 MG tablet; Take 1 tablet by mouth daily. In place of lisinopril hctz for BP  Dispense: 90 tablet; Refill: 0 - metoprolol succinate (TOPROL-XL) 50 MG 24 hr tablet; TAKE 1 TABLET BY MOUTH DAILY WITH OR IMMEDIATELY FOLLOWING A MEAL  Dispense: 90 tablet; Refill: 0 - COMPLETE METABOLIC PANEL WITH GFR  2. Migraine without aura and without status migrainosus, not intractable  - metoprolol succinate (TOPROL-XL) 50 MG 24 hr tablet; TAKE 1 TABLET BY MOUTH DAILY WITH OR IMMEDIATELY FOLLOWING A MEAL  Dispense: 90 tablet; Refill: 0 - nortriptyline (PAMELOR) 10 MG capsule; Take 1 capsule (10 mg total) by mouth at bedtime.  Dispense: 90 capsule; Refill: 1  3. Pure hypercholesterolemia  - rosuvastatin (CRESTOR) 10 MG tablet; Take 1 tablet (10 mg total) by mouth daily.  Dispense: 90 tablet; Refill: 1 - Lipid panel  4. Need for Tdap vaccination  refused  5. Spina  bifida aperta of lumbar spine (HCC)  Stable, no changes   6. Neurogenic bladder  Continue follow up with Dr. Liliane Shi  7. Self-catheterizes urinary bladder

## 2017-11-21 ENCOUNTER — Other Ambulatory Visit: Payer: Self-pay | Admitting: Family Medicine

## 2017-11-21 DIAGNOSIS — E7800 Pure hypercholesterolemia, unspecified: Secondary | ICD-10-CM

## 2017-11-21 LAB — COMPLETE METABOLIC PANEL WITH GFR
AG RATIO: 1.3 (calc) (ref 1.0–2.5)
ALBUMIN MSPROF: 4.4 g/dL (ref 3.6–5.1)
ALT: 15 U/L (ref 9–46)
AST: 16 U/L (ref 10–40)
Alkaline phosphatase (APISO): 70 U/L (ref 40–115)
BILIRUBIN TOTAL: 0.4 mg/dL (ref 0.2–1.2)
BUN: 19 mg/dL (ref 7–25)
CHLORIDE: 101 mmol/L (ref 98–110)
CO2: 27 mmol/L (ref 20–32)
Calcium: 9.9 mg/dL (ref 8.6–10.3)
Creat: 1.13 mg/dL (ref 0.60–1.35)
GFR, EST AFRICAN AMERICAN: 92 mL/min/{1.73_m2} (ref >=60)
GFR, Est Non African American: 80 mL/min/{1.73_m2} (ref >=60)
GLUCOSE: 80 mg/dL (ref 65–139)
Globulin: 3.3 g/dL (calc) (ref 1.9–3.7)
POTASSIUM: 3.7 mmol/L (ref 3.5–5.3)
SODIUM: 140 mmol/L (ref 135–146)
Total Protein: 7.7 g/dL (ref 6.1–8.1)

## 2017-11-21 LAB — LIPID PANEL
Cholesterol: 271 mg/dL — ABNORMAL HIGH (ref <200)
HDL: 69 mg/dL (ref >40)
LDL Cholesterol (Calc): 180 mg/dL (calc) — ABNORMAL HIGH
Non-HDL Cholesterol (Calc): 202 mg/dL (calc) — ABNORMAL HIGH (ref <130)
TRIGLYCERIDES: 102 mg/dL (ref <150)
Total CHOL/HDL Ratio: 3.9 (calc) (ref <5.0)

## 2017-11-21 MED ORDER — ROSUVASTATIN CALCIUM 40 MG PO TABS: 40.0000 mg | ORAL_TABLET | Freq: Every day | ORAL | 1 refills | Status: DC

## 2017-11-23 ENCOUNTER — Other Ambulatory Visit: Payer: Self-pay | Admitting: Family Medicine

## 2017-11-23 MED ORDER — LOSARTAN POTASSIUM-HCTZ 100-25 MG PO TABS: 1.0000 | ORAL_TABLET | Freq: Every day | ORAL | 0 refills | Status: DC

## 2017-11-30 ENCOUNTER — Telehealth: Payer: Self-pay

## 2017-11-30 ENCOUNTER — Other Ambulatory Visit: Payer: Self-pay | Admitting: Family Medicine

## 2017-11-30 MED ORDER — LISINOPRIL-HYDROCHLOROTHIAZIDE 20-25 MG PO TABS: 1.0000 | ORAL_TABLET | Freq: Every day | ORAL | 0 refills | Status: DC

## 2017-11-30 NOTE — Telephone Encounter (Signed)
WE changed medication because bp was also elevated during last visit, changed it back to lisinopril hctz 20/25. Please ask him to come in within one month for bp check in our office.

## 2017-11-30 NOTE — Telephone Encounter (Signed)
Patient mom called with concerns about his blood pressure being elevated since his medication was changed from Lisinopril to Losartan-HCTZ in March. He has been more dizzy and light headed and nauseous. Bp was 153/93 this morning when his mom took it.  Patient wants to start back taking the Lisinopril-HCTZ and he wants the dose increased. He feels comfortable taking the medication he was previously on. He has been reading something about recalls and thinks that the Losartan has been recalled and thinks it will cause him to have more health problems.

## 2017-12-01 NOTE — Telephone Encounter (Signed)
Called and left a vm informing patient that his medication was changed back to Lisinopril-HCTZ 20-25 mg as requested. I also left a vm informing him that he needs to follow up in one month for a bp check. If he calls back please inform him of to schedule appointment and of the medication change.

## 2018-01-15 ENCOUNTER — Ambulatory Visit (INDEPENDENT_AMBULATORY_CARE_PROVIDER_SITE_OTHER): Payer: Medicare Other | Admitting: Nurse Practitioner

## 2018-01-15 ENCOUNTER — Encounter: Payer: Self-pay | Admitting: Nurse Practitioner

## 2018-01-15 VITALS — BP 138/82 | HR 89 | Temp 98.4°F | Resp 18 | Ht 65.0 in | Wt 165.7 lb

## 2018-01-15 DIAGNOSIS — I10 Essential (primary) hypertension: Secondary | ICD-10-CM | POA: Diagnosis not present

## 2018-01-15 MED ORDER — LISINOPRIL-HYDROCHLOROTHIAZIDE 20-25 MG PO TABS: 1.0000 | ORAL_TABLET | Freq: Every day | ORAL | 0 refills | Status: DC

## 2018-01-15 NOTE — Progress Notes (Addendum)
Name: Juan Hodges   MRN: [id number]    DOB: 06-15-1976   Date:01/15/2018       Progress Note  Subjective  Chief Complaint  Chief Complaint  Patient presents with  . Hypertension    BP check. Has been out of medication for 1 week    HPI  Hypertension Patient is on metoprolol succinate  every after afternoon without any missed doses. Was switched to lisinopril-HCTZ 20-25 in march due to hypertension but ran out last week and has been out of this medication.  Has cut back but still has some salt diet- likes chips.  Occasionally checks blood pressures at work when staff checking it is there with range of 116-140/80's Denies chest pain, headaches, blurry vision, dizziness.    Patient Active Problem List   Diagnosis Date Noted  . Self-catheterizes urinary bladder 04/27/2017  . H/O fracture of hip 01/01/2016  . Vitamin D deficiency 01/01/2016  . 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 Hypertension, benign 01/01/2016  . Neurogenic bladder 12/19/2009  . Migraine without aura and without status migrainosus, not intractable 11/09/2009  . Chronic urinary tract infection 08/27/2007    Past Medical History:  Diagnosis Date  . Chronic UTI (urinary tract infection)   . HLD (hyperlipidemia)   . Hypertension   . Spina bifida [facility])     Past Surgical History:  Procedure Laterality Date  . FEMUR SURGERY     broken bone  . HIP SURGERY    . SPINAL FIXATION SURGERY      Social History   Tobacco Use  . Smoking status: Never Smoker  . Smokeless tobacco: Never Used  Substance Use Topics  . Alcohol use: No    Alcohol/week: 0.0 oz     Current Outpatient Medications:  .  Cholecalciferol (D3-1000) 1000 units capsule, Take by mouth., Disp: , Rfl:  .  lisinopril-hydrochlorothiazide (PRINZIDE,ZESTORETIC) 20-25 MG tablet, Take 1 tablet by mouth daily., Disp: 40 tablet, Rfl: 0 .  metoprolol succinate (TOPROL-XL) 50 MG 24 hr tablet, TAKE 1 TABLET BY MOUTH DAILY WITH  OR IMMEDIATELY FOLLOWING A MEAL, Disp: 90 tablet, Rfl: 0 .  nortriptyline (PAMELOR) 10 MG capsule, Take 1 capsule (10 mg total) by mouth at bedtime., Disp: 90 capsule, Rfl: 1 .  rosuvastatin (CRESTOR) 40 MG tablet, Take 1 tablet (40 mg total) by mouth daily., Disp: 90 tablet, Rfl: 1  Allergies  Allergen Reactions  . Augmentin [Amoxicillin-Pot Clavulanate]     GI distress    ROS   No other specific complaints in a complete review of systems (except as listed in HPI above).  Objective  Vitals:   01/15/18 1145 01/15/18 1208  BP: 140/88 138/82  Pulse: 89   Resp: 18   Temp: 98.4 F (36.9 C)   TempSrc: Oral   SpO2: 94%   Weight: 165 lb 11.2 oz (75.2 kg)   Height:  (1.651 m)      Body mass index is 27.57 kg/m.  Nursing Note and Vital Signs reviewed.  Physical Exam   Constitutional: Patient appears well-developed and well-nourished.  No distress.  Cardiovascular: Normal rate, regular rhythm, S1/S2 present.  No murmur or rub heard.  Pulmonary/Chest: Effort normal and breath sounds clear. No respiratory distress or retractions. Psychiatric: Patient has a normal mood and affect. behavior is normal. Judgment and thought content normal.  No results found for this or any previous visit (from the past 72 hour(s)).  Assessment & Plan  1. Hypertension, benign -  continue metoprolol, restart prinzide, nurse recheck BP in one week, provider appt in one month - DASH  - lisinopril-hydrochlorothiazide (PRINZIDE,ZESTORETIC) 20-25 MG tablet; Take 1 tablet by mouth daily.  Dispense: 40 tablet; Refill: 0    -Red flags and when to present for emergency care or RTC including fever >101.8F, chest pain, shortness of breath, new/worsening/un-resolving symptoms,  reviewed with patient at time of visit. Follow up and care instructions discussed and provided in AVS.  ----------------------------- I have reviewed this encounter including the documentation in this note and/or discussed this  patient with the provider, Sharyon Cable DNP AGNP-C. I am certifying that I agree with the content of this note as supervising physician. Baruch Gouty, MD [facility] 02/03/2018, 4:38 PM

## 2018-01-15 NOTE — Patient Instructions (Addendum)
- Please come in on 5/17, next Friday for a nurse visit with your automatic blood pressure cuff so we can make sure that it is accurate.   -We will follow-up with you in 1 month to refill your blood pressure medications and recheck your blood pressure.  DASH Eating Plan DASH stands for "Dietary Approaches to Stop Hypertension." The DASH eating plan is a healthy eating plan that has been shown to reduce high blood pressure (hypertension). It may also reduce your risk for type 2 diabetes, heart disease, and stroke. The DASH eating plan may also help with weight loss. What are tips for following this plan? General guidelines  Avoid eating more than 2,300 mg (milligrams) of salt (sodium) a day. If you have hypertension, you may need to reduce your sodium intake to 1,500 mg a day.  Limit alcohol intake to no more than 1 drink a day for nonpregnant women and 2 drinks a day for men. One drink equals 12 oz of beer, 5 oz of wine, or 1 oz of hard liquor.  Work with your health care provider to maintain a healthy body weight or to lose weight. Ask what an ideal weight is for you.  Get at least 30 minutes of exercise that causes your heart to beat faster (aerobic exercise) most days of the week. Activities may include walking, swimming, or biking.  Work with your health care provider or diet and nutrition specialist (dietitian) to adjust your eating plan to your individual calorie needs. Reading food labels  Check food labels for the amount of sodium per serving. Choose foods with less than 5 percent of the Daily Value of sodium. Generally, foods with less than 300 mg of sodium per serving fit into this eating plan.  To find whole grains, look for the word "whole" as the first word in the ingredient list. Shopping  Buy products labeled as "low-sodium" or "no salt added."  Buy fresh foods. Avoid canned foods and premade or frozen meals. Cooking  Avoid adding salt when cooking. Use salt-free  seasonings or herbs instead of table salt or sea salt. Check with your health care provider or pharmacist before using salt substitutes.  Do not fry foods. Cook foods using healthy methods such as baking, boiling, grilling, and broiling instead.  Cook with heart-healthy oils, such as olive, canola, soybean, or sunflower oil. Meal planning   Eat a balanced diet that includes: ? 5 or more servings of fruits and vegetables each day. At each meal, try to fill half of your plate with fruits and vegetables. ? Up to 6-8 servings of whole grains each day. ? Less than 6 oz of lean meat, poultry, or fish each day. A 3-oz serving of meat is about the same size as a deck of cards. One egg equals 1 oz. ? 2 servings of low-fat dairy each day. ? A serving of nuts, seeds, or beans 5 times each week. ? Heart-healthy fats. Healthy fats called Omega-3 fatty acids are found in foods such as flaxseeds and coldwater fish, like sardines, salmon, and mackerel.  Limit how much you eat of the following: ? Canned or prepackaged foods. ? Food that is high in trans fat, such as fried foods. ? Food that is high in saturated fat, such as fatty meat. ? Sweets, desserts, sugary drinks, and other foods with added sugar. ? Full-fat dairy products.  Do not salt foods before eating.  Try to eat at least 2 vegetarian meals each week.  Eat  more home-cooked food and less restaurant, buffet, and fast food.  When eating at a restaurant, ask that your food be prepared with less salt or no salt, if possible. What foods are recommended? The items listed may not be a complete list. Talk with your dietitian about what dietary choices are best for you. Grains Whole-grain or whole-wheat bread. Whole-grain or whole-wheat pasta. Brown rice. [place]. Bulgur. Whole-grain and low-sodium cereals. Pita bread. Low-fat, low-sodium crackers. Whole-wheat flour tortillas. Vegetables Fresh or frozen vegetables (raw, steamed, roasted,  or grilled). Low-sodium or reduced-sodium tomato and vegetable juice. Low-sodium or reduced-sodium tomato sauce and tomato paste. Low-sodium or reduced-sodium canned vegetables. Fruits All fresh, dried, or frozen fruit. Canned fruit in natural juice (without added sugar). Meat and other protein foods Skinless chicken or [place]. Ground chicken or [place]. Pork with fat trimmed off. Fish and seafood. Egg whites. Dried beans, peas, or lentils. Unsalted nuts, nut butters, and seeds. Unsalted canned beans. Lean cuts of beef with fat trimmed off. Low-sodium, lean deli meat. Dairy Low-fat (1%) or fat-free (skim) milk. Fat-free, low-fat, or reduced-fat cheeses. Nonfat, low-sodium ricotta or cottage cheese. Low-fat or nonfat yogurt. Low-fat, low-sodium cheese. Fats and oils Soft margarine without trans fats. Vegetable oil. Low-fat, reduced-fat, or light mayonnaise and salad dressings (reduced-sodium). Canola, safflower, olive, soybean, and sunflower oils. Avocado. Seasoning and other foods Herbs. Spices. Seasoning mixes without salt. Unsalted popcorn and pretzels. Fat-free sweets. What foods are not recommended? The items listed may not be a complete list. Talk with your dietitian about what dietary choices are best for you. Grains Baked goods made with fat, such as croissants, muffins, or some breads. Dry pasta or rice meal packs. Vegetables Creamed or fried vegetables. Vegetables in a cheese sauce. Regular canned vegetables (not low-sodium or reduced-sodium). Regular canned tomato sauce and paste (not low-sodium or reduced-sodium). Regular tomato and vegetable juice (not low-sodium or reduced-sodium). Angie Fava. Olives. Fruits Canned fruit in a light or heavy syrup. Fried fruit. Fruit in cream or butter sauce. Meat and other protein foods Fatty cuts of meat. Ribs. Fried meat. Berniece Salines. Sausage. Bologna and other processed lunch meats. Salami. Fatback. Hotdogs. Bratwurst. Salted nuts and seeds. Canned beans  with added salt. Canned or smoked fish. Whole eggs or egg yolks. Chicken or [place] with skin. Dairy Whole or 2% milk, cream, and half-and-half. Whole or full-fat cream cheese. Whole-fat or sweetened yogurt. Full-fat cheese. Nondairy creamers. Whipped toppings. Processed cheese and cheese spreads. Fats and oils Butter. Stick margarine. Lard. Shortening. Ghee. Bacon fat. Tropical oils, such as coconut, palm kernel, or palm oil. Seasoning and other foods Salted popcorn and pretzels. Onion salt, garlic salt, seasoned salt, table salt, and sea salt. Worcestershire sauce. Tartar sauce. Barbecue sauce. Teriyaki sauce. Soy sauce, including reduced-sodium. Steak sauce. Canned and packaged gravies. Fish sauce. Oyster sauce. Cocktail sauce. Horseradish that you find on the shelf. Ketchup. Mustard. Meat flavorings and tenderizers. Bouillon cubes. Hot sauce and Tabasco sauce. Premade or packaged marinades. Premade or packaged taco seasonings. Relishes. Regular salad dressings. Where to find more information:  National Heart, Lung, and [place]: https://wilson-eaton.com/  American Heart Association: www.heart.org Summary  The DASH eating plan is a healthy eating plan that has been shown to reduce high blood pressure (hypertension). It may also reduce your risk for type 2 diabetes, heart disease, and stroke.  With the DASH eating plan, you should limit salt (sodium) intake to 2,300 mg a day. If you have hypertension, you may need to reduce your sodium intake to  1,500 mg a day.  When on the DASH eating plan, aim to eat more fresh fruits and vegetables, whole grains, lean proteins, low-fat dairy, and heart-healthy fats.  Work with your health care provider or diet and nutrition specialist (dietitian) to adjust your eating plan to your individual calorie needs. This information is not intended to replace advice given to you by your health care provider. Make sure you discuss any questions you have with your health  care provider. Document Released: 08/14/2011 Document Revised: 08/18/2016 Document Reviewed: 08/18/2016 Elsevier Interactive Patient Education  [employer].

## 2018-01-22 ENCOUNTER — Ambulatory Visit: Payer: Medicare Other

## 2018-01-22 VITALS — BP 116/74 | HR 77

## 2018-01-22 DIAGNOSIS — I10 Essential (primary) hypertension: Secondary | ICD-10-CM

## 2018-01-22 NOTE — Progress Notes (Signed)
Patient is here for a blood pressure check. Patient denies chest pain, palpitations, shortness of breath or visual disturbances. At previous visit blood pressure was 138/82 with a heart rate of 89. Today during nurse visit first check blood pressure was 118/78 . After resting for 10 minutes it was 116/74 and heart rate was 77. He does take any blood pressure medications.  Patient was not taking medication last visit due to running out but per Dr. Carlynn Purl BP was normal today since he started back on medication. Continue BP medication and come back during his regular follow up visits.

## 2018-02-15 ENCOUNTER — Other Ambulatory Visit: Payer: Self-pay | Admitting: Family Medicine

## 2018-02-15 DIAGNOSIS — I10 Essential (primary) hypertension: Secondary | ICD-10-CM

## 2018-02-15 NOTE — Telephone Encounter (Signed)
Hypertension medication request: Lisinopril-HTCZ  Last office visit pertaining to hypertension: 01/22/18   BP Readings from Last 3 Encounters:  01/22/18 116/74  01/15/18 138/82  11/20/17 140/90    Lab Results  Component Value Date   CREATININE 1.13 11/20/2017   BUN 19 11/20/2017   NA 140 11/20/2017   K 3.7 11/20/2017   CL 101 11/20/2017   CO2 27 11/20/2017     Follow up on 02/26/18

## 2018-02-15 NOTE — Telephone Encounter (Signed)
Pt needs refill on Lisinopril -hctz [facility] [place]

## 2018-02-17 MED ORDER — LISINOPRIL-HYDROCHLOROTHIAZIDE 20-25 MG PO TABS: 1.0000 | ORAL_TABLET | Freq: Every day | ORAL | 0 refills | Status: DC

## 2018-02-18 ENCOUNTER — Other Ambulatory Visit: Payer: Self-pay | Admitting: Family Medicine

## 2018-02-26 ENCOUNTER — Encounter: Payer: Self-pay | Admitting: Family Medicine

## 2018-02-26 ENCOUNTER — Ambulatory Visit (INDEPENDENT_AMBULATORY_CARE_PROVIDER_SITE_OTHER): Payer: Medicare Other | Admitting: Family Medicine

## 2018-02-26 VITALS — BP 124/82 | HR 80 | Temp 98.3°F | Resp 18 | Ht 65.0 in | Wt 162.2 lb

## 2018-02-26 DIAGNOSIS — E7800 Pure hypercholesterolemia, unspecified: Secondary | ICD-10-CM

## 2018-02-26 DIAGNOSIS — N319 Neuromuscular dysfunction of bladder, unspecified: Secondary | ICD-10-CM

## 2018-02-26 DIAGNOSIS — Z789 Other specified health status: Secondary | ICD-10-CM | POA: Diagnosis not present

## 2018-02-26 DIAGNOSIS — Q057 Lumbar spina bifida without hydrocephalus: Secondary | ICD-10-CM

## 2018-02-26 DIAGNOSIS — I10 Essential (primary) hypertension: Secondary | ICD-10-CM | POA: Diagnosis not present

## 2018-02-26 DIAGNOSIS — Z982 Presence of cerebrospinal fluid drainage device: Secondary | ICD-10-CM | POA: Diagnosis not present

## 2018-02-26 DIAGNOSIS — G43009 Migraine without aura, not intractable, without status migrainosus: Secondary | ICD-10-CM | POA: Diagnosis not present

## 2018-02-26 DIAGNOSIS — E559 Vitamin D deficiency, unspecified: Secondary | ICD-10-CM

## 2018-02-26 MED ORDER — LISINOPRIL-HYDROCHLOROTHIAZIDE 20-25 MG PO TABS: 1.0000 | ORAL_TABLET | Freq: Every day | ORAL | 0 refills | Status: DC

## 2018-02-26 MED ORDER — ROSUVASTATIN CALCIUM 40 MG PO TABS: 40.0000 mg | ORAL_TABLET | Freq: Every day | ORAL | 1 refills | Status: DC

## 2018-02-26 MED ORDER — METOPROLOL SUCCINATE ER 50 MG PO TB24: ORAL_TABLET | ORAL | 0 refills | Status: DC

## 2018-02-26 NOTE — Progress Notes (Signed)
Name: Juan Hodges   MRN: [id number]    DOB: 09-Mar-1976   Date:02/26/2018       Progress Note  Subjective  Chief Complaint  Chief Complaint  Patient presents with  . Medication Refill  . Migraine    Takes Notripyline once month when a migraine starts when he gets noise sensivity  . Hyperlipidemia  . Hypertension    Denies any symptoms    HPI  HTN: he is now on Lisinopril/hctz and metoprolol, bpis at goal. No chest pain, dizzinessor palpitation . Unchanged   Vitamin D deficiency: he is back on otc supplementation, discussed healthy diet   Hyperlipidemia: last labs  Showed LDL of 180 , sent Crestor 40 mg, however he is still taking 10 mg tablets he had a t home, advised to take four of the 10 mg tablets until he runs out and get new rx for 40 mg and go back to one daily.His mother is here with him and also understands directions   Migraine: he is on beta-blocker and nortriptyline, migraines down from once a week to once a month  explained that he needs to take it qhs to prevent episodes and not prn, but still taking it prn.Hedescribes it asthrobbing, frontal, no phonophobia but has phonophobia. No nausea or vomiting.  Neurogenic bladder/recurrent UTI:/spina bifida: seeing Urologist now, he is now taking Cephalexin prn symptoms. He states that symptoms usually consists of strong odor, still self cath at least 3 times per day. Seeing Dr. Sande BrothersWinters now. Gait is stable  He has not been seeing neurologist    Patient Active Problem List   Diagnosis Date Noted  . Self-catheterizes urinary bladder 04/27/2017  . H/O fracture of hip 01/01/2016  . Vitamin D deficiency 01/01/2016  . 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 Hypertension, benign 01/01/2016  . Neurogenic bladder 12/19/2009  . Migraine without aura and without status migrainosus, not intractable 11/09/2009  . Chronic urinary tract infection 08/27/2007    Past Surgical History:  Procedure Laterality  Date  . FEMUR SURGERY     broken bone  . HIP SURGERY    . SPINAL FIXATION SURGERY      Family History  Problem Relation Age of Onset  . Hyperlipidemia Mother   . Hypertension Mother   . Kidney cancer Maternal Grandmother   . Kidney disease Neg Hx   . Prostate cancer Neg Hx     Social History   Socioeconomic History  . Marital status: Single    Spouse name: Not on file  . Number of children: Not on file  . Years of education: Not on file  . Highest education level: Not on file  Occupational History  . Not on file  Social Needs  . Financial resource strain: Not on file  . Food insecurity:    Worry: Not on file    Inability: Not on file  . Transportation needs:    Medical: Not on file    Non-medical: Not on file  Tobacco Use  . Smoking status: Never Smoker  . Smokeless tobacco: Never Used  Substance and Sexual Activity  . Alcohol use: No    Alcohol/week: 0.0 oz  . Drug use: No  . Sexual activity: Not on file  Lifestyle  . Physical activity:    Days per week: Not on file    Minutes per session: Not on file  . Stress: Not on file  Relationships  . Social connections:    Talks on  phone: Not on file    Gets together: Not on file    Attends religious service: Not on file    Active member of club or organization: Not on file    Attends meetings of clubs or organizations: Not on file    Relationship status: Not on file  . Intimate partner violence:    Fear of current or ex partner: Not on file    Emotionally abused: Not on file    Physically abused: Not on file    Forced sexual activity: Not on file  Other Topics Concern  . Not on file  Social History Narrative  . Not on file     Current Outpatient Medications:  .  Cholecalciferol (D3-1000) 1000 units capsule, Take by mouth., Disp: , Rfl:  .  lisinopril-hydrochlorothiazide (PRINZIDE,ZESTORETIC) 20-25 MG tablet, Take 1 tablet by mouth daily., Disp: 90 tablet, Rfl: 0 .  metoprolol succinate (TOPROL-XL) 50 MG  24 hr tablet, TAKE 1 TABLET BY MOUTH DAILY WITH OR IMMEDIATELY FOLLOWING A MEAL, Disp: 90 tablet, Rfl: 0 .  nortriptyline (PAMELOR) 10 MG capsule, Take 1 capsule (10 mg total) by mouth at bedtime., Disp: 90 capsule, Rfl: 1 .  rosuvastatin (CRESTOR) 40 MG tablet, Take 1 tablet (40 mg total) by mouth daily., Disp: 90 tablet, Rfl: 1  Allergies  Allergen Reactions  . Augmentin [Amoxicillin-Pot Clavulanate]     GI distress     ROS  Constitutional: Negative for fever or weight change.  Respiratory: Negative for cough and shortness of breath.   Cardiovascular: Negative for chest pain or palpitations.  Gastrointestinal: Negative for abdominal pain, no bowel changes.  Musculoskeletal:Postiive for gait problem but no  joint swelling.  Skin: Negative for rash.  Neurological: Negative for dizziness , positive for intermittent  headache.  No other specific complaints in a complete review of systems (except as listed in HPI above).  Objective  Vitals:   02/26/18 1120  BP: 124/82  Pulse: (!) 111  Resp: 18  Temp: 98.3 F (36.8 C)  TempSrc: Oral  SpO2: 96%  Weight: 162 lb 3.2 oz (73.6 kg)  Height: 5\' 5"  (1.651 m)    Body mass index is 26.99 kg/m.  Physical Exam  Constitutional: Patient appears well-developed and well-nourished.  No distress.  HEENT: head atraumatic, normocephalic, pupils equal and reactive to light,  neck supple, throat within normal limits Cardiovascular: Normal rate, regular rhythm and normal heart sounds.  No murmur heard. No BLE edema. Pulmonary/Chest: Effort normal and breath sounds normal. No respiratory distress. Abdominal: Soft.  There is no tenderness. Psychiatric: Patient has a normal mood and affect. behavior is normal. Judgment and thought content normal. Neurological : he has braces, walks slowly, he had braces re-adjusted and no longer has sores on his legs  PHQ2/9: Depression screen [facility] 2/9 02/26/2018 04/27/2017 11/11/2016 03/18/2016 05/09/2015  Decreased  Interest 0 0 0 0 0  Down, Depressed, Hopeless 0 0 0 0 0  PHQ - 2 Score 0 0 0 0 0     Fall Risk: Fall Risk  02/26/2018 11/20/2017 04/27/2017 11/11/2016 03/18/2016  Falls in the past year? No No No No No     Functional Status Survey: Is the patient deaf or have difficulty hearing?: No Does the patient have difficulty seeing, even when wearing glasses/contacts?: No Does the patient have difficulty concentrating, remembering, or making decisions?: No Does the patient have difficulty walking or climbing stairs?: Yes Does the patient have difficulty dressing or bathing?: No Does the patient have  difficulty doing errands alone such as visiting a doctor's office or shopping?: Yes    Assessment & Plan  1. Hypertension, benign  - lisinopril-hydrochlorothiazide (PRINZIDE,ZESTORETIC) 20-25 MG tablet; Take 1 tablet by mouth daily.  Dispense: 90 tablet; Refill: 0 - metoprolol succinate (TOPROL-XL) 50 MG 24 hr tablet; TAKE 1 TABLET BY MOUTH DAILY WITH OR IMMEDIATELY FOLLOWING A MEAL  Dispense: 90 tablet; Refill: 0   2. Pure hypercholesterolemia  - rosuvastatin (CRESTOR) 40 MG tablet; Take 1 tablet (40 mg total) by mouth daily.  Dispense: 90 tablet; Refill: 1  3. Migraine without aura and without status migrainosus, not intractable  - metoprolol succinate (TOPROL-XL) 50 MG 24 hr tablet; TAKE 1 TABLET BY MOUTH DAILY WITH OR IMMEDIATELY FOLLOWING A MEAL  Dispense: 90 tablet; Refill: 0  4. 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 Neurogenic bladder   6. Self-catheterizes urinary bladder   7. Vitamin D deficiency  Continue supplementation   8. History of brain shunt  Non functional, but discussed immunization ( pcv, meningococcal ) but he refuses today

## 2018-04-30 ENCOUNTER — Ambulatory Visit (INDEPENDENT_AMBULATORY_CARE_PROVIDER_SITE_OTHER): Payer: Medicare Other

## 2018-04-30 VITALS — BP 108/78 | HR 70 | Temp 97.7°F | Resp 14 | Ht 65.0 in | Wt 166.8 lb

## 2018-04-30 DIAGNOSIS — Z0000 Encounter for general adult medical examination without abnormal findings: Secondary | ICD-10-CM | POA: Diagnosis not present

## 2018-04-30 NOTE — Patient Instructions (Signed)
Juan Hodges , Thank you for taking time to come for your Medicare Wellness Visit. I appreciate your ongoing commitment to your health goals. Please review the following plan we discussed and let me know if I can assist you in the future.   Screening recommendations/referrals: Colorectal Screening: Not required  Vision and Dental Exams: Recommended annual ophthalmology exams for early detection of glaucoma and other disorders of the eye Recommended annual dental exams for proper oral hygiene  Vaccinations: Influenza vaccine: Overdue Pneumococcal vaccine: Not yet required Tdap vaccine: Declined. Please call your insurance company to determine your out of pocket expense. You may also receive this vaccine at your local pharmacy or Health Dept. Shingles vaccine: Not yet required    Advanced directives: Advance directive discussed with you today. I have provided a copy for you to complete at home and have notarized. Once this is complete please bring a copy in to our office so we can scan it into your chart.  Goals: Recommend to drink at least 6-8 8oz glasses of water per day.  Next appointment: Please schedule your Annual Wellness Visit with your Nurse Health Advisor in one year.  Preventive Care 40-64 Years, Male Preventive care refers to lifestyle choices and visits with your health care provider that can promote health and wellness. What does preventive care include?  A yearly physical exam. This is also called an annual well check.  Dental exams once or twice a year.  Routine eye exams. Ask your health care provider how often you should have your eyes checked.  Personal lifestyle choices, including:  Daily care of your teeth and gums.  Regular physical activity.  Eating a healthy diet.  Avoiding tobacco and drug use.  Limiting alcohol use.  Practicing safe sex.  Taking low-dose aspirin every day starting at age 43. What happens during an annual well check? The services  and screenings done by your health care provider during your annual well check will depend on your age, overall health, lifestyle risk factors, and family history of disease. Counseling  Your health care provider may ask you questions about your:  Alcohol use.  Tobacco use.  Drug use.  Emotional well-being.  Home and relationship well-being.  Sexual activity.  Eating habits.  Work and work Astronomerenvironment. Screening  You may have the following tests or measurements:  Height, weight, and BMI.  Blood pressure.  Lipid and cholesterol levels. These may be checked every 5 years, or more frequently if you are over 43 years old.  Skin check.  Lung cancer screening. You may have this screening every year starting at age 43 if you have a 30-pack-year history of smoking and currently smoke or have quit within the past 15 years.  Fecal occult blood test (FOBT) of the stool. You may have this test every year starting at age 43.  Flexible sigmoidoscopy or colonoscopy. You may have a sigmoidoscopy every 5 years or a colonoscopy every 10 years starting at age 250.  Prostate cancer screening. Recommendations will vary depending on your family history and other risks.  Hepatitis C blood test.  Hepatitis B blood test.  Sexually transmitted disease (STD) testing.  Diabetes screening. This is done by checking your blood sugar (glucose) after you have not eaten for a while (fasting). You may have this done every 1-3 years. Discuss your test results, treatment options, and if necessary, the need for more tests with your health care provider. Vaccines  Your health care provider may recommend certain vaccines, such  as:  Influenza vaccine. This is recommended every year.  Tetanus, diphtheria, and acellular pertussis (Tdap, Td) vaccine. You may need a Td booster every 10 years.  Zoster vaccine. You may need this after age 75.  Pneumococcal 13-valent conjugate (PCV13) vaccine. You may need  this if you have certain conditions and have not been vaccinated.  Pneumococcal polysaccharide (PPSV23) vaccine. You may need one or two doses if you smoke cigarettes or if you have certain conditions. Talk to your health care provider about which screenings and vaccines you need and how often you need them. This information is not intended to replace advice given to you by your health care provider. Make sure you discuss any questions you have with your health care provider. Document Released: 09/21/2015 Document Revised: 05/14/2016 Document Reviewed: 06/26/2015 Elsevier Interactive Patient Education  2017 [employer].  Fall Prevention in the Home Falls can cause injuries. They can happen to people of all ages. There are many things you can do to make your home safe and to help prevent falls. What can I do on the outside of my home?  Regularly fix the edges of walkways and driveways and fix any cracks.  Remove anything that might make you trip as you walk through a door, such as a raised step or threshold.  Trim any bushes or trees on the path to your home.  Use bright outdoor lighting.  Clear any walking paths of anything that might make someone trip, such as rocks or tools.  Regularly check to see if handrails are loose or broken. Make sure that both sides of any steps have handrails.  Any raised decks and porches should have guardrails on the edges.  Have any leaves, snow, or ice cleared regularly.  Use sand or salt on walking paths during winter.  Clean up any spills in your garage right away. This includes oil or grease spills. What can I do in the bathroom?  Use night lights.  Install grab bars by the toilet and in the tub and shower. Do not use towel bars as grab bars.  Use non-skid mats or decals in the tub or shower.  If you need to sit down in the shower, use a plastic, non-slip stool.  Keep the floor dry. Clean up any water that spills on the floor as soon as it  happens.  Remove soap buildup in the tub or shower regularly.  Attach bath mats securely with double-sided non-slip rug tape.  Do not have throw rugs and other things on the floor that can make you trip. What can I do in the bedroom?  Use night lights.  Make sure that you have a light by your bed that is easy to reach.  Do not use any sheets or blankets that are too big for your bed. They should not hang down onto the floor.  Have a firm chair that has side arms. You can use this for support while you get dressed.  Do not have throw rugs and other things on the floor that can make you trip. What can I do in the kitchen?  Clean up any spills right away.  Avoid walking on wet floors.  Keep items that you use a lot in easy-to-reach places.  If you need to reach something above you, use a strong step stool that has a grab bar.  Keep electrical cords out of the way.  Do not use floor polish or wax that makes floors slippery. If you must  use wax, use non-skid floor wax.  Do not have throw rugs and other things on the floor that can make you trip. What can I do with my stairs?  Do not leave any items on the stairs.  Make sure that there are handrails on both sides of the stairs and use them. Fix handrails that are broken or loose. Make sure that handrails are as long as the stairways.  Check any carpeting to make sure that it is firmly attached to the stairs. Fix any carpet that is loose or worn.  Avoid having throw rugs at the top or bottom of the stairs. If you do have throw rugs, attach them to the floor with carpet tape.  Make sure that you have a light switch at the top of the stairs and the bottom of the stairs. If you do not have them, ask someone to add them for you. What else can I do to help prevent falls?  Wear shoes that:  Do not have high heels.  Have rubber bottoms.  Are comfortable and fit you well.  Are closed at the toe. Do not wear sandals.  If you  use a stepladder:  Make sure that it is fully opened. Do not climb a closed stepladder.  Make sure that both sides of the stepladder are locked into place.  Ask someone to hold it for you, if possible.  Clearly mark and make sure that you can see:  Any grab bars or handrails.  First and last steps.  Where the edge of each step is.  Use tools that help you move around (mobility aids) if they are needed. These include:  Canes.  Walkers.  Scooters.  Crutches.  Turn on the lights when you go into a dark area. Replace any light bulbs as soon as they burn out.  Set up your furniture so you have a clear path. Avoid moving your furniture around.  If any of your floors are uneven, fix them.  If there are any pets around you, be aware of where they are.  Review your medicines with your doctor. Some medicines can make you feel dizzy. This can increase your chance of falling. Ask your doctor what other things that you can do to help prevent falls. This information is not intended to replace advice given to you by your health care provider. Make sure you discuss any questions you have with your health care provider. Document Released: 06/21/2009 Document Revised: 01/31/2016 Document Reviewed: 09/29/2014 Elsevier Interactive Patient Education  2017 [employer].

## 2018-04-30 NOTE — Progress Notes (Addendum)
Subjective:   Juan Hodges is a 42 y.o. male who presents for Medicare Annual/Subsequent preventive examination.  Review of Systems:  N/A Cardiac Risk Factors include: male gender;dyslipidemia;hypertension;sedentary lifestyle     Objective:    Vitals: BP 108/78 (BP Location: Left Arm, Patient Position: Sitting, Cuff Size: Normal)   Pulse 70   Temp 97.7 F (36.5 C) (Oral)   Resp 14   Ht 5\' 5"  (1.651 m)   Wt 166 lb 12.8 oz (75.7 kg)   SpO2 94%   BMI 27.76 kg/m   Body mass index is 27.76 kg/m.  Advanced Directives 04/30/2018 04/27/2017 04/27/2017 02/18/2017 11/11/2016 03/18/2016 05/09/2015  Does Patient Have a Medical Advance Directive? No No No No No No Yes  Type of Advance Directive - - - - - - Medical sales representative in Chart? - - - - - - No - copy requested  Would patient like information on creating a medical advance directive? Yes (MAU/Ambulatory/Procedural Areas - Information given) - No - Patient declined - - No - patient declined information -    Tobacco Social History   Tobacco Use  Smoking Status Never Smoker  Smokeless Tobacco Never Used  Tobacco Comment   smoking cessation materials not required     Counseling given: No Comment: smoking cessation materials not required  Clinical Intake:  Pre-visit preparation completed: Yes  Pain : No/denies pain   BMI - recorded: 27.76 Nutritional Status: BMI 25 -29 Overweight Nutritional Risks: None Diabetes: No  How often do you need to have someone help you when you read instructions, pamphlets, or other written materials from your doctor or pharmacy?: 1 - Never  Interpreter Needed?: No  Information entered by :: AEversole, LPN  Past Medical History:  Diagnosis Date  . Chronic UTI (urinary tract infection)   . HLD (hyperlipidemia)   . Hypertension   . Spina bifida [facility])    Past Surgical History:  Procedure Laterality Date  . FEMUR SURGERY     broken bone  . HIP  SURGERY    . SPINAL FIXATION SURGERY     Family History  Problem Relation Age of Onset  . Hyperlipidemia Mother   . Hypertension Mother   . Kidney cancer Maternal Grandmother   . Kidney disease Neg Hx   . Prostate cancer Neg Hx    Social History   Socioeconomic History  . Marital status: Single    Spouse name: Not on file  . Number of children: 0  . Years of education: Not on file  . Highest education level: 12th grade  Occupational History    Employer: [employer]  Social Needs  . Financial resource strain: Not hard at all  . Food insecurity:    Worry: Never true    Inability: Never true  . Transportation needs:    Medical: No    Non-medical: No  Tobacco Use  . Smoking status: Never Smoker  . Smokeless tobacco: Never Used  . Tobacco comment: smoking cessation materials not required  Substance and Sexual Activity  . Alcohol use: No    Alcohol/week: 0.0 standard drinks  . Drug use: No  . Sexual activity: Not Currently  Lifestyle  . Physical activity:    Days per week: 0 days    Minutes per session: 0 min  . Stress: Not at all  Relationships  . Social connections:    Talks on phone: Patient refused    Gets together: Patient refused  Attends religious service: Patient refused    Active member of club or organization: Patient refused    Attends meetings of clubs or organizations: Patient refused    Relationship status: Patient refused  Other Topics Concern  . Not on file  Social History Narrative   Does not drive    Outpatient Encounter Medications as of 04/30/2018  Medication Sig  . Cholecalciferol (D3-1000) 1000 units capsule Take by mouth.  Marland Kitchen. lisinopril-hydrochlorothiazide (PRINZIDE,ZESTORETIC) 20-25 MG tablet Take 1 tablet by mouth daily.  . metoprolol succinate (TOPROL-XL) 50 MG 24 hr tablet TAKE 1 TABLET BY MOUTH DAILY WITH OR IMMEDIATELY FOLLOWING A MEAL  . nortriptyline (PAMELOR) 10 MG capsule Take 1 capsule (10 mg total) by mouth at bedtime.  .  rosuvastatin (CRESTOR) 40 MG tablet Take 1 tablet (40 mg total) by mouth daily.   No facility-administered encounter medications on file as of 04/30/2018.     Activities of Daily Living In your present state of health, do you have any difficulty performing the following activities: 04/30/2018 02/26/2018  Hearing? N N  Comment denies hearing aids -  Vision? N N  Comment denies eyeglasses -  Difficulty concentrating or making decisions? N N  Walking or climbing stairs? N Y  Dressing or bathing? N N  Doing errands, shopping? Y Y  Comment mom drives to and from appts and work -  Quarry managerreparing Food and eating ? N -  Comment denies dentures -  Using the Toilet? N -  In the past six months, have you accidently leaked urine? Y -  Comment self cath -  Do you have problems with loss of bowel control? N -  Managing your Medications? N -  Managing your Finances? N -  Housekeeping or managing your Housekeeping? N -  Some recent data might be hidden    Patient Care Team: Alba CorySowles, Krichna, MD as PCP - General (Family Medicine) Rene PaciWinter, Christopher Aaron, MD as Consulting Physician (Urology)   Assessment:   This is a routine wellness examination for [name].  Exercise Activities and Dietary recommendations Current Exercise Habits: The patient does not participate in regular exercise at present, Exercise limited by: None identified  Goals    . DIET - INCREASE WATER INTAKE     Recommend to drink at least 6-8 8oz glasses of water per day.    . Eat more fruits and vegetables     Recommend to increase the amount of fruits and vegetables in daily diet to at least 1 serving of each.        Fall Risk Fall Risk  04/30/2018 02/26/2018 11/20/2017 04/27/2017 11/11/2016  Falls in the past year? No No No No No  Risk for fall due to : Impaired balance/gait;History of fall(s) - - - -  Risk for fall due to: Comment fell and broke R femur - - - -   FALL RISK PREVENTION PERTAINING TO HOME: Is your home free of  loose throw rugs in walkways, pet beds, electrical cords, etc? Yes Is there adequate lighting in your home to reduce risk of falls?  Yes Are there stairs in or around your home WITH handrails? Yes  ASSISTIVE DEVICES UTILIZED TO PREVENT FALLS: Use of a cane, walker or w/c? No Grab bars in the bathroom? Yes  Shower chair or a place to sit while bathing? No An elevated toilet seat or a handicapped toilet? Yes  Timed Get Up and Go Performed: Yes. Pt ambulated 10 feet within 12 sec. Gait slow, steady and  without the use of an assistive device. No intervention required at this time. Fall risk prevention has been discussed.  Community Resource Referral:  Pt declined my offer to send [employer] Referral to Care Guide for a shower chair.  Depression Screen PHQ 2/9 Scores 04/30/2018 02/26/2018 04/27/2017 11/11/2016  PHQ - 2 Score 0 0 0 0  PHQ- 9 Score 0 - - -    Cognitive Function     6CIT Screen 04/30/2018  What Year? 0 points  What month? 0 points  What time? 0 points  Count back from 20 0 points  Months in reverse 2 points  Repeat phrase 2 points  Total Score 4    There is no immunization history for the selected administration types on file for this patient.  Qualifies for Shingles Vaccine? No  Overdue for Flu vaccine. Education has been provided regarding the importance of this vaccine and advised to receive when available. Verbalized acceptance and understanding.  Due for Tdap vaccine. Education has been provided regarding the importance of this vaccine. Advised may receive this vaccine at local pharmacy or Health Dept. Aware to provide a copy of the vaccination record if obtained from local pharmacy or Health Dept. Verbalized acceptance and understanding.   Screening Tests Health Maintenance  Topic Date Due  . INFLUENZA VACCINE  07/23/2018 (Originally 04/08/2018)  . TETANUS/TDAP  11/21/2018 (Originally 07/30/1994)  . HIV Screening  Completed   Cancer Screenings: Lung:  Low Dose CT Chest recommended if Age 7-80 years, 30 pack-year currently smoking OR have quit w/in 15years. Patient does not qualify. Colorectal: Not yet required  Additional Screenings: Hepatitis C Screening: Does not qualify     Plan:  I have personally reviewed and addressed the Medicare Annual Wellness questionnaire and have noted the following in the patient's chart:  A. Medical and social history B. Use of alcohol, tobacco or illicit drugs  C. Current medications and supplements D. Functional ability and status E.  Nutritional status F.  Physical activity G. Advance directives H. List of other physicians I.  Hospitalizations, surgeries, and ER visits in previous 12 months J.  Vitals K. Screenings such as hearing and vision if needed, cognitive and depression L. Referrals and appointments  In addition, I have reviewed and discussed with patient certain preventive protocols, quality metrics, and best practice recommendations. A written personalized care plan for preventive services as well as general preventive health recommendations were provided to patient.  See attached scanned questionnaire for additional information.   Signed,  Deon Pilling, LPN Nurse Health Advisor  I have reviewed this encounter including the documentation in this note and/or discussed this patient with the provider, Deon Pilling, LPN. I am certifying that I agree with the content of this note as supervising physician.  Alba Cory, MD [facility] 04/30/2018, 2:59 PM

## 2018-06-05 ENCOUNTER — Other Ambulatory Visit: Payer: Self-pay | Admitting: Family Medicine

## 2018-06-05 DIAGNOSIS — G43009 Migraine without aura, not intractable, without status migrainosus: Secondary | ICD-10-CM

## 2018-06-05 DIAGNOSIS — I10 Essential (primary) hypertension: Secondary | ICD-10-CM

## 2018-08-27 DIAGNOSIS — R3914 Feeling of incomplete bladder emptying: Secondary | ICD-10-CM | POA: Diagnosis not present

## 2018-08-27 DIAGNOSIS — N3020 Other chronic cystitis without hematuria: Secondary | ICD-10-CM | POA: Diagnosis not present

## 2018-09-10 ENCOUNTER — Other Ambulatory Visit: Payer: Self-pay | Admitting: Family Medicine

## 2018-09-10 DIAGNOSIS — I10 Essential (primary) hypertension: Secondary | ICD-10-CM

## 2018-09-17 ENCOUNTER — Encounter: Payer: Self-pay | Admitting: Family Medicine

## 2018-09-17 ENCOUNTER — Ambulatory Visit (INDEPENDENT_AMBULATORY_CARE_PROVIDER_SITE_OTHER): Payer: Medicare Other | Admitting: Family Medicine

## 2018-09-17 VITALS — BP 128/88 | HR 102 | Temp 97.8°F | Resp 18 | Ht 64.0 in | Wt 164.5 lb

## 2018-09-17 DIAGNOSIS — N319 Neuromuscular dysfunction of bladder, unspecified: Secondary | ICD-10-CM

## 2018-09-17 DIAGNOSIS — Z23 Encounter for immunization: Secondary | ICD-10-CM | POA: Diagnosis not present

## 2018-09-17 DIAGNOSIS — Q057 Lumbar spina bifida without hydrocephalus: Secondary | ICD-10-CM | POA: Diagnosis not present

## 2018-09-17 DIAGNOSIS — E7800 Pure hypercholesterolemia, unspecified: Secondary | ICD-10-CM | POA: Diagnosis not present

## 2018-09-17 DIAGNOSIS — G43009 Migraine without aura, not intractable, without status migrainosus: Secondary | ICD-10-CM | POA: Diagnosis not present

## 2018-09-17 DIAGNOSIS — Z789 Other specified health status: Secondary | ICD-10-CM | POA: Diagnosis not present

## 2018-09-17 DIAGNOSIS — I10 Essential (primary) hypertension: Secondary | ICD-10-CM | POA: Diagnosis not present

## 2018-09-17 DIAGNOSIS — M62569 Muscle wasting and atrophy, not elsewhere classified, unspecified lower leg: Secondary | ICD-10-CM | POA: Diagnosis not present

## 2018-09-17 DIAGNOSIS — R739 Hyperglycemia, unspecified: Secondary | ICD-10-CM

## 2018-09-17 DIAGNOSIS — R269 Unspecified abnormalities of gait and mobility: Secondary | ICD-10-CM | POA: Diagnosis not present

## 2018-09-17 DIAGNOSIS — E559 Vitamin D deficiency, unspecified: Secondary | ICD-10-CM | POA: Diagnosis not present

## 2018-09-17 MED ORDER — ROSUVASTATIN CALCIUM 20 MG PO TABS: 20.0000 mg | ORAL_TABLET | Freq: Every day | ORAL | 1 refills | Status: DC

## 2018-09-17 MED ORDER — NORTRIPTYLINE HCL 10 MG PO CAPS: 10.0000 mg | ORAL_CAPSULE | Freq: Every day | ORAL | 1 refills | Status: DC

## 2018-09-17 MED ORDER — METOPROLOL SUCCINATE ER 50 MG PO TB24: ORAL_TABLET | ORAL | 1 refills | Status: DC

## 2018-09-17 MED ORDER — ROSUVASTATIN CALCIUM 40 MG PO TABS: 40.0000 mg | ORAL_TABLET | Freq: Every day | ORAL | 1 refills | Status: DC

## 2018-09-17 MED ORDER — LISINOPRIL-HYDROCHLOROTHIAZIDE 20-25 MG PO TABS: 1.0000 | ORAL_TABLET | Freq: Every day | ORAL | 1 refills | Status: DC

## 2018-09-17 NOTE — Progress Notes (Signed)
Name: Juan Hodges   MRN: [id number]    DOB: [birth year]   Date:09/17/2018       Progress Note  Subjective  Chief Complaint  Chief Complaint  Patient presents with  . Medication Refill    6 month F/U  . Hypertension    Headaches but attributes them to migraine  . Migraine    Has them weekly, light and noise sensitive, sleep makes them better and listens to music, dark rooms helps  . Hyperlipidemia    Muscle aches with medication  . Neurogenic bladder/recurrent UTI:/spina bifida:    Still seeing Urologist    HPI  HTN: he is now on Lisinopril/hctz and metoprolol, bpis at goal. No chest pain, dizzinessor palpitation . BP was elevated during visit to urologist but back to normal today   Vitamin D deficiency: he is back on otc supplementation, we will recheck level today   Hyperlipidemia: last labs  Showed LDL of 180 , he was taking crestor 40 mg daily but it causes muscle aches so now he is not taking medication, explained importance of taking medication again, I will send 20 mg dose , explained importance of compliance   Migraine: he is on beta-blocker and nortriptyline, but only taking Nortriptyline prn and headaches are back to once a week, advised to take medication as prescribed. Hedescribes it asthrobbing, frontal, no phonophobia but has phonophobia. No nausea or vomiting.  Neurogenic bladder/recurrent UTI:/spina bifida: seeing Urologist now, he is now taking Cephalexin prn symptoms. He states that symptoms usually consists of strong odor, still self cath at least 3 times per day. Seeing Dr. Sande BrothersWinters in [place]. Gait is stable  He has not been seeing neurologist in many years.   Patient Active Problem List   Diagnosis Date Noted  . Self-catheterizes urinary bladder 04/27/2017  . H/O fracture of hip 01/01/2016  . Vitamin D deficiency 01/01/2016  . 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 Hypertension, benign 01/01/2016  . Neurogenic bladder  12/19/2009  . Migraine without aura and without status migrainosus, not intractable 11/09/2009  . Chronic urinary tract infection 08/27/2007    Past Surgical History:  Procedure Laterality Date  . FEMUR SURGERY     broken bone  . HIP SURGERY    . SPINAL FIXATION SURGERY      Family History  Problem Relation Age of Onset  . Hyperlipidemia Mother   . Hypertension Mother   . Kidney cancer Maternal Grandmother   . Kidney disease Neg Hx   . Prostate cancer Neg Hx     Social History   Socioeconomic History  . Marital status: Single    Spouse name: Not on file  . Number of children: 0  . Years of education: Not on file  . Highest education level: 12th grade  Occupational History    Employer: [employer]  Social Needs  . Financial resource strain: Not hard at all  . Food insecurity:    Worry: Never true    Inability: Never true  . Transportation needs:    Medical: No    Non-medical: No  Tobacco Use  . Smoking status: Never Smoker  . Smokeless tobacco: Never Used  . Tobacco comment: smoking cessation materials not required  Substance and Sexual Activity  . Alcohol use: No    Alcohol/week: 0.0 standard drinks  . Drug use: No  . Sexual activity: Not Currently  Lifestyle  . Physical activity:    Days per week: 0 days  Minutes per session: 0 min  . Stress: Not at all  Relationships  . Social connections:    Talks on phone: Patient refused    Gets together: Patient refused    Attends religious service: Patient refused    Active member of club or organization: Patient refused    Attends meetings of clubs or organizations: Patient refused    Relationship status: Patient refused  . Intimate partner violence:    Fear of current or ex partner: No    Emotionally abused: No    Physically abused: No    Forced sexual activity: No  Other Topics Concern  . Not on file  Social History Narrative   Does not drive     Current Outpatient Medications:  .  Cholecalciferol  (D3-1000) 1000 units capsule, Take by mouth., Disp: , Rfl:  .  lisinopril-hydrochlorothiazide (PRINZIDE,ZESTORETIC) 20-25 MG tablet, Take 1 tablet by mouth daily., Disp: 90 tablet, Rfl: 1 .  metoprolol succinate (TOPROL-XL) 50 MG 24 hr tablet, Take with or immediately following a meal., Disp: 90 tablet, Rfl: 1 .  nortriptyline (PAMELOR) 10 MG capsule, Take 1 capsule (10 mg total) by mouth at bedtime., Disp: 90 capsule, Rfl: 1 .  rosuvastatin (CRESTOR) 40 MG tablet, Take 1 tablet (40 mg total) by mouth daily., Disp: 90 tablet, Rfl: 1  Allergies  Allergen Reactions  . Augmentin [Amoxicillin-Pot Clavulanate]     GI distress    I personally reviewed active problem list, medication list, allergies, family history, social history with the patient/caregiver today.   ROS  Constitutional: Negative for fever or weight change.  Respiratory: Negative for cough and shortness of breath.   Cardiovascular: Negative for chest pain or palpitations.  Gastrointestinal: Negative for abdominal pain, no bowel changes.  Musculoskeletal:Positive for gait problem but no joint swelling.  Skin: Negative for rash.  Neurological: Negative for dizziness , positive for intermittent  headache.  No other specific complaints in a complete review of systems (except as listed in HPI above).  Objective  Vitals:   09/17/18 1050  BP: 128/88  Pulse: (!) 102  Resp: 18  Temp: 97.8 F (36.6 C)  TempSrc: Oral  SpO2: 99%  Weight: 164 lb 8 oz (74.6 kg)  Height: 5\' 4"  (1.626 m)    Body mass index is 28.24 kg/m.  Physical Exam  Constitutional: Patient appears well-developed and well-nourished. Overweight. No distress.  HEENT: head atraumatic, normocephalic, pupils equal and reactive to light, neck supple, throat within normal limits Cardiovascular: Normal rate, regular rhythm and normal heart sounds.  No murmur heard. No BLE edema. Pulmonary/Chest: Effort normal and breath sounds normal. No respiratory  distress. Muscular Skeletal: wears braces on both legs because of atrophy secondary to spina bifida Abdominal: Soft.  There is no tenderness. Psychiatric: Patient has a normal mood and affect. behavior is normal. Judgment and thought content normal.  PHQ2/9: Depression screen [facility] 2/9 09/17/2018 04/30/2018 02/26/2018 04/27/2017 11/11/2016  Decreased Interest 0 0 0 0 0  Down, Depressed, Hopeless 0 0 0 0 0  PHQ - 2 Score 0 0 0 0 0  Altered sleeping - 0 - - -  Tired, decreased energy - 0 - - -  Change in appetite - 0 - - -  Feeling bad or failure about yourself  - 0 - - -  Trouble concentrating - 0 - - -  Moving slowly or fidgety/restless - 0 - - -  Suicidal thoughts - 0 - - -  PHQ-9 Score - 0 - - -  Difficult doing work/chores - Not difficult at all - - -     Fall Risk: Fall Risk  09/17/2018 04/30/2018 02/26/2018 11/20/2017 04/27/2017  Falls in the past year? 0 No No No No  Number falls in past yr: 0 - - - -  Injury with Fall? 0 - - - -  Risk for fall due to : - Impaired balance/gait;History of fall(s) - - -  Risk for fall due to: Comment - fell and broke R femur - - -      Functional Status Survey: Is the patient deaf or have difficulty hearing?: No Does the patient have difficulty seeing, even when wearing glasses/contacts?: No Does the patient have difficulty concentrating, remembering, or making decisions?: No Does the patient have difficulty walking or climbing stairs?: Yes(Spina bifida) Does the patient have difficulty dressing or bathing?: No Does the patient have difficulty doing errands alone such as visiting a doctor's office or shopping?: No    Assessment & Plan  1. Hypertension, benign  - lisinopril-hydrochlorothiazide (PRINZIDE,ZESTORETIC) 20-25 MG tablet; Take 1 tablet by mouth daily.  Dispense: 90 tablet; Refill: 1 - metoprolol succinate (TOPROL-XL) 50 MG 24 hr tablet; Take with or immediately following a meal.  Dispense: 90 tablet; Refill: 1 - CBC with  Differential/Platelet - COMPLETE METABOLIC PANEL WITH GFR  2. Migraine without aura and without status migrainosus, not intractable  - metoprolol succinate (TOPROL-XL) 50 MG 24 hr tablet; Take with or immediately following a meal.  Dispense: 90 tablet; Refill: 1 - nortriptyline (PAMELOR) 10 MG capsule; Take 1 capsule (10 mg total) by mouth at bedtime.  Dispense: 90 capsule; Refill: 1  3. Pure hypercholesterolemia  - rosuvastatin (CRESTOR) 40 MG tablet; Take 1 tablet (40 mg total) by mouth daily.  Dispense: 90 tablet; Refill: 1 - Lipid panel  4. Vitamin D deficiency  - VITAMIN D 25 Hydroxy (Vit-D Deficiency, Fractures)  5. Hyperglycemia  - Hemoglobin A1c  6. Neurogenic bladder   7. 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 Self-catheterizes urinary bladder  Sees Urologist   9. Need for 23-polyvalent pneumococcal polysaccharide vaccine  refused  10. Need for meningococcal vaccination  refused  11. Gait disturbance  - Ambulatory referral to Orthopedic Surgery  12. Atrophy of muscle of lower leg, unspecified laterality  - Ambulatory referral to Orthopedic Surgery

## 2018-09-18 LAB — COMPLETE METABOLIC PANEL WITH GFR
AG Ratio: 1.4 (calc) (ref 1.0–2.5)
ALT: 15 U/L (ref 9–46)
AST: 16 U/L (ref 10–40)
Albumin: 4.4 g/dL (ref 3.6–5.1)
Alkaline phosphatase (APISO): 80 U/L (ref 40–115)
BUN: 21 mg/dL (ref 7–25)
CO2: 30 mmol/L (ref 20–32)
Calcium: 9.9 mg/dL (ref 8.6–10.3)
Chloride: 99 mmol/L (ref 98–110)
Creat: 1.06 mg/dL (ref 0.60–1.35)
GFR, Est African American: 99 mL/min/{1.73_m2} (ref >=60)
GFR, Est Non African American: 86 mL/min/{1.73_m2} (ref >=60)
Globulin: 3.1 g/dL (calc) (ref 1.9–3.7)
Glucose, Bld: 95 mg/dL (ref 65–139)
Potassium: 3.6 mmol/L (ref 3.5–5.3)
Sodium: 140 mmol/L (ref 135–146)
Total Bilirubin: 0.7 mg/dL (ref 0.2–1.2)
Total Protein: 7.5 g/dL (ref 6.1–8.1)

## 2018-09-18 LAB — HEMOGLOBIN A1C
Hgb A1c MFr Bld: 5.7 % of total Hgb — ABNORMAL HIGH (ref <5.7)
Mean Plasma Glucose: 117 (calc)
eAG (mmol/L): 6.5 (calc)

## 2018-09-18 LAB — CBC WITH DIFFERENTIAL/PLATELET
Absolute Monocytes: 490 cells/uL (ref 200–950)
BASOS PCT: 0.3 %
Basophils Absolute: 19 cells/uL (ref 0–200)
Eosinophils Absolute: 118 cells/uL (ref 15–500)
Eosinophils Relative: 1.9 %
HCT: 49.5 % (ref 38.5–50.0)
Hemoglobin: 16.8 g/dL (ref 13.2–17.1)
Lymphs Abs: 1879 cells/uL (ref 850–3900)
MCH: 29.1 pg (ref 27.0–33.0)
MCHC: 33.9 g/dL (ref 32.0–36.0)
MCV: 85.6 fL (ref 80.0–100.0)
MPV: 9.7 fL (ref 7.5–12.5)
Monocytes Relative: 7.9 %
Neutro Abs: 3695 cells/uL (ref 1500–7800)
Neutrophils Relative %: 59.6 %
Platelets: 344 10*3/uL (ref 140–400)
RBC: 5.78 10*6/uL (ref 4.20–5.80)
RDW: 12.9 % (ref 11.0–15.0)
Total Lymphocyte: 30.3 %
WBC: 6.2 10*3/uL (ref 3.8–10.8)

## 2018-09-18 LAB — LIPID PANEL
CHOLESTEROL: 281 mg/dL — AB (ref <200)
HDL: 62 mg/dL (ref >40)
LDL CHOLESTEROL (CALC): 201 mg/dL — AB
Non-HDL Cholesterol (Calc): 219 mg/dL (calc) — ABNORMAL HIGH (ref <130)
TRIGLYCERIDES: 78 mg/dL (ref <150)
Total CHOL/HDL Ratio: 4.5 (calc) (ref <5.0)

## 2018-09-18 LAB — VITAMIN D 25 HYDROXY (VIT D DEFICIENCY, FRACTURES): Vit D, 25-Hydroxy: 12 ng/mL — ABNORMAL LOW (ref 30–100)

## 2018-09-19 ENCOUNTER — Other Ambulatory Visit: Payer: Self-pay | Admitting: Family Medicine

## 2018-09-19 MED ORDER — VITAMIN D (ERGOCALCIFEROL) 1.25 MG (50000 UNIT) PO CAPS: 50000.0000 [IU] | ORAL_CAPSULE | ORAL | 0 refills | Status: DC

## 2018-09-20 ENCOUNTER — Telehealth: Payer: Self-pay | Admitting: Family Medicine

## 2018-09-20 ENCOUNTER — Other Ambulatory Visit: Payer: Self-pay | Admitting: Family Medicine

## 2018-09-20 DIAGNOSIS — E559 Vitamin D deficiency, unspecified: Secondary | ICD-10-CM

## 2018-09-20 MED ORDER — VITAMIN D (ERGOCALCIFEROL) 1.25 MG (50000 UNIT) PO CAPS: 50000.0000 [IU] | ORAL_CAPSULE | ORAL | 0 refills | Status: DC

## 2018-09-20 NOTE — Telephone Encounter (Signed)
Spoke with mother and he had already left for work but will be told to call us back tomorrow.

## 2018-09-20 NOTE — Telephone Encounter (Signed)
Pt returning call to Tiffany.

## 2018-09-20 NOTE — Telephone Encounter (Signed)
Spoke with [employer] and they ran the Vitamin D 15945 as Cash for $38.00 because it was not covered under insurance. Informed his mother Juan Hodges that per Dr. Carlynn Purl could just take otc Vitamin D 2000 daily instead of paying the $38 dollar for that one prescription.

## 2018-09-20 NOTE — Telephone Encounter (Signed)
Copied from CRM [id number]. Topic: General - Other >> Sep 20, 2018  1:15 PM Gaynelle Adu wrote: Reason for CRM: patient is requesting a call back from a nurse in regards to labs result that was given to his mother, the patient stated he has a very questions. Please advise >> Sep 20, 2018  3:42 PM Gean Birchwood R wrote: Patient called in to speak with someone regarding results that was given to him  CB# [phone]

## 2018-09-20 NOTE — Telephone Encounter (Signed)
I sent under vitamin D deficiency. Is that what they want? Not sure what is necessary to be done. Thank you

## 2018-09-20 NOTE — Telephone Encounter (Signed)
Patient's pharmacy is asking for the Vitamin D, Ergocalciferol, (DRISDOL) 1.25 MG (50000 UT) CAPS capsule prescription to be resubmitted to the pharmacy with a different code because it cost to much for the patient. The pharmacy will then be able to process the prescription where the patient can get it at a lower cost.  Patient stated Dr. Carlynn Purl has done this before.

## 2018-09-21 NOTE — Telephone Encounter (Signed)
Spoke with patient and reiterate his lab work to him and per Dr. Carlynn Purl could take the otc Vitamin D 2000 unit daily due to cost of high dose Vitamin D.

## 2019-03-18 ENCOUNTER — Other Ambulatory Visit: Payer: Self-pay

## 2019-03-18 ENCOUNTER — Ambulatory Visit (INDEPENDENT_AMBULATORY_CARE_PROVIDER_SITE_OTHER): Payer: Medicare Other | Admitting: Family Medicine

## 2019-03-18 ENCOUNTER — Encounter: Payer: Self-pay | Admitting: Family Medicine

## 2019-03-18 VITALS — BP 125/80 | HR 75 | Wt 160.0 lb

## 2019-03-18 DIAGNOSIS — G43009 Migraine without aura, not intractable, without status migrainosus: Secondary | ICD-10-CM

## 2019-03-18 DIAGNOSIS — Z789 Other specified health status: Secondary | ICD-10-CM

## 2019-03-18 DIAGNOSIS — L918 Other hypertrophic disorders of the skin: Secondary | ICD-10-CM

## 2019-03-18 DIAGNOSIS — Q057 Lumbar spina bifida without hydrocephalus: Secondary | ICD-10-CM

## 2019-03-18 DIAGNOSIS — I10 Essential (primary) hypertension: Secondary | ICD-10-CM

## 2019-03-18 DIAGNOSIS — N319 Neuromuscular dysfunction of bladder, unspecified: Secondary | ICD-10-CM | POA: Diagnosis not present

## 2019-03-18 DIAGNOSIS — E559 Vitamin D deficiency, unspecified: Secondary | ICD-10-CM | POA: Diagnosis not present

## 2019-03-18 DIAGNOSIS — E7800 Pure hypercholesterolemia, unspecified: Secondary | ICD-10-CM | POA: Diagnosis not present

## 2019-03-18 MED ORDER — VITAMIN D (ERGOCALCIFEROL) 1.25 MG (50000 UNIT) PO CAPS: 50000.0000 [IU] | ORAL_CAPSULE | ORAL | 0 refills | Status: DC

## 2019-03-18 MED ORDER — ROSUVASTATIN CALCIUM 20 MG PO TABS: 20.0000 mg | ORAL_TABLET | Freq: Every day | ORAL | 1 refills | Status: DC

## 2019-03-18 MED ORDER — METOPROLOL SUCCINATE ER 50 MG PO TB24: ORAL_TABLET | ORAL | 1 refills | Status: DC

## 2019-03-18 MED ORDER — LISINOPRIL-HYDROCHLOROTHIAZIDE 20-25 MG PO TABS: 1.0000 | ORAL_TABLET | Freq: Every day | ORAL | 1 refills | Status: DC

## 2019-03-18 NOTE — Progress Notes (Signed)
Name: Juan Hodges   MRN: [id number]    DOB: 01/26/1976   Date:03/18/2019       Progress Note  Subjective  Chief Complaint  Chief Complaint  Patient presents with   Hypertension   Hyperlipidemia   Medication Refill    I connected with  Matthew Saras  on 03/18/19 at 11:40 AM EDT by a video enabled telemedicine application and verified that I am speaking with the correct person using two identifiers.  I discussed the limitations of evaluation and management by telemedicine and the availability of in person appointments. The patient expressed understanding and agreed to proceed. Staff also discussed with the patient that there may be a patient responsible charge related to this service. Patient Location: at home , mother on the background  Provider Location: [facility]   HPI  HTN: he is now on Lisinopril/hctz and metoprolol, bpis at goal, he checked it at home today. No chest pain, dizzinessor palpitation   Vitamin D deficiency: he is back on otc supplementation. Unchanged   Hyperlipidemia: last Lipid panel was very high but he was not taking medication, currently taking crestor 20 mg daily and tolerating it well. Discussed coming in for blood work done on his next visit  Migraine: he is on beta-blocker and nortriptyline,he states currently having episodes less than once a week even though only taking nortriptyline prn Hedescribes it asthrobbing, frontal, no phonophobia but has phonophobia. No nausea or vomiting.  Neurogenic bladder/recurrent UTI:/spina bifida: seeing Urologist now, he is now taking Cephalexin prn symptoms. He states that symptoms usually consists of strong odor, still self cath at least 3 times per day. Seeing Dr. Gilford Rile in [place]. Gait is stableHe has not been seeing neurologist in many years.Unchnaged    Patient Active Problem List   Diagnosis Date Noted   Self-catheterizes urinary bladder 04/27/2017   H/O fracture of  hip 01/01/2016   Vitamin D deficiency 01/01/2016   Spina bifida aperta of lumbar spine ([place]) 01/01/2016   Hypertension, benign 01/01/2016   Neurogenic bladder 12/19/2009   Migraine without aura and without status migrainosus, not intractable 11/09/2009   Chronic urinary tract infection 08/27/2007    Past Surgical History:  Procedure Laterality Date   FEMUR SURGERY     broken bone   HIP SURGERY     SPINAL FIXATION SURGERY      Family History  Problem Relation Age of Onset   Hyperlipidemia Mother    Hypertension Mother    Kidney cancer Maternal Grandmother    Kidney disease Neg Hx    Prostate cancer Neg Hx     Social History   Socioeconomic History   Marital status: Single    Spouse name: Not on file   Number of children: 0   Years of education: Not on file   Highest education level: 12th grade  Occupational History    Employer: [employer]  Social Needs   Financial resource strain: Not hard at all   Food insecurity    Worry: Never true    Inability: Never true   Transportation needs    Medical: No    Non-medical: No  Tobacco Use   Smoking status: Never Smoker   Smokeless tobacco: Never Used   Tobacco comment: smoking cessation materials not required  Substance and Sexual Activity   Alcohol use: No    Alcohol/week: 0.0 standard drinks   Drug use: No   Sexual activity: Not Currently  Lifestyle   Physical activity  Days per week: 0 days    Minutes per session: 0 min   Stress: Not at all  Relationships   Social connections    Talks on phone: Patient refused    Gets together: Patient refused    Attends religious service: Patient refused    Active member of club or organization: Patient refused    Attends meetings of clubs or organizations: Patient refused    Relationship status: Patient refused   Intimate partner violence    Fear of current or ex partner: No    Emotionally abused: No    Physically abused: No    Forced sexual  activity: No  Other Topics Concern   Not on file  Social History Narrative   Does not drive     Current Outpatient Medications:    Cholecalciferol (D3-1000) 1000 units capsule, Take by mouth., Disp: , Rfl:    lisinopril-hydrochlorothiazide (PRINZIDE,ZESTORETIC) 20-25 MG tablet, Take 1 tablet by mouth daily., Disp: 90 tablet, Rfl: 1   metoprolol succinate (TOPROL-XL) 50 MG 24 hr tablet, Take with or immediately following a meal., Disp: 90 tablet, Rfl: 1   nortriptyline (PAMELOR) 10 MG capsule, Take 1 capsule (10 mg total) by mouth at bedtime., Disp: 90 capsule, Rfl: 1   rosuvastatin (CRESTOR) 20 MG tablet, Take 1 tablet (20 mg total) by mouth daily., Disp: 90 tablet, Rfl: 1   Vitamin D, Ergocalciferol, (DRISDOL) 1.25 MG (50000 UT) CAPS capsule, Take 1 capsule (50,000 Units total) by mouth every 7 (seven) days., Disp: 12 capsule, Rfl: 0  Allergies  Allergen Reactions   Augmentin [Amoxicillin-Pot Clavulanate]     GI distress    I personally reviewed active problem list, medication list, allergies, family history, social history with the patient/caregiver today.   ROS  Ten systems reviewed and is negative except as mentioned in HPI   Objective  Vitals:   03/18/19 1009 03/18/19 1012  BP:  125/80  Pulse:  75  Weight: 160 lb (72.6 kg)     Body mass index is 27.46 kg/m.  Physical Exam  Awake, alert and oriented Large skin tag on left side of face  PHQ2/9: [facility] 2/9 03/18/2019 09/17/2018 04/30/2018 02/26/2018 04/27/2017  Decreased Interest 0 0 0 0 0  Down, Depressed, Hopeless 0 0 0 0 0  PHQ - 2 Score 0 0 0 0 0  Altered sleeping 0 - 0 - -  Tired, decreased energy 0 - 0 - -  Change in appetite 0 - 0 - -  Feeling bad or failure about yourself  0 - 0 - -  Trouble concentrating 0 - 0 - -  Moving slowly or fidgety/restless 0 - 0 - -  Suicidal thoughts 0 - 0 - -  PHQ-9 Score 0 - 0 - -  Difficult doing work/chores - - Not difficult at all - -   PHQ-2/9  Result is negative.    Fall Risk: Fall Risk  03/18/2019 09/17/2018 04/30/2018 02/26/2018 11/20/2017  Falls in the past year? 0 0 No No No  Number falls in past yr: 0 0 - - -  Injury with Fall? 0 0 - - -  Risk for fall due to : - - Impaired balance/gait;History of fall(s) - -  Risk for fall due to: Comment - - fell and broke R femur - -     Assessment & Plan  1. 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 Self-catheterizes urinary bladder  Three to four times daily  3. Hypertension, benign  - lisinopril-hydrochlorothiazide (ZESTORETIC) 20-25 MG tablet; Take 1 tablet by mouth daily.  Dispense: 90 tablet; Refill: 1 - metoprolol succinate (TOPROL-XL) 50 MG 24 hr tablet; Take with or immediately following a meal.  Dispense: 90 tablet; Refill: 1  4. Pure hypercholesterolemia  - rosuvastatin (CRESTOR) 20 MG tablet; Take 1 tablet (20 mg total) by mouth daily.  Dispense: 90 tablet; Refill: 1  5. Vitamin D deficiency  - Vitamin D, Ergocalciferol, (DRISDOL) 1.25 MG (50000 UT) CAPS capsule; Take 1 capsule (50,000 Units total) by mouth every 7 (seven) days.  Dispense: 12 capsule; Refill: 0  6. Neurogenic bladder   7. Skin tag  On his face and bothersome, discussed it may have a cost but can come in for removal  8. Migraine without aura and without status migrainosus, not intractable  - metoprolol succinate (TOPROL-XL) 50 MG 24 hr tablet; Take with or immediately following a meal.  Dispense: 90 tablet; Refill: 1  I discussed the assessment and treatment plan with the patient. The patient was provided an opportunity to ask questions and all were answered. The patient agreed with the plan and demonstrated an understanding of the instructions.  The patient was advised to call back or seek an in-person evaluation if the symptoms worsen or if the condition fails to improve as anticipated.  I provided 25 minutes of non-face-to-face time during this encounter.

## 2019-03-21 ENCOUNTER — Other Ambulatory Visit: Payer: Self-pay | Admitting: Family Medicine

## 2019-03-21 DIAGNOSIS — G43009 Migraine without aura, not intractable, without status migrainosus: Secondary | ICD-10-CM

## 2019-05-06 ENCOUNTER — Ambulatory Visit: Payer: Medicare Other

## 2019-05-06 ENCOUNTER — Ambulatory Visit (INDEPENDENT_AMBULATORY_CARE_PROVIDER_SITE_OTHER): Payer: Medicare Other

## 2019-05-06 VITALS — BP 126/80 | Ht 64.0 in | Wt 160.0 lb

## 2019-05-06 DIAGNOSIS — Z0000 Encounter for general adult medical examination without abnormal findings: Secondary | ICD-10-CM | POA: Diagnosis not present

## 2019-05-06 NOTE — Progress Notes (Signed)
Subjective:   MAVIN OSORTO is a 43 y.o. male who presents for Medicare Annual/Subsequent preventive examination.  Virtual Visit via Telephone Note  I connected with [name] on 05/06/19 at 10:40 AM EDT by telephone and verified that I am speaking with the correct person using two identifiers.  Medicare Annual Wellness visit completed telephonically due to Covid-19 pandemic.   Location: Patient: home Provider: office   I discussed the limitations, risks, security and privacy concerns of performing an evaluation and management service by telephone and the availability of in person appointments. The patient expressed understanding and agreed to proceed.  Some vital signs may be absent or patient reported.   Reather Littler, LPN    Review of Systems:   Cardiac Risk Factors include: hypertension;male gender;dyslipidemia     Objective:    Vitals: BP 126/80   Ht 5\' 4"  (1.626 m)   Wt 160 lb (72.6 kg)   BMI 27.46 kg/m   Body mass index is 27.46 kg/m.  Advanced Directives 05/06/2019 04/30/2018 04/27/2017 04/27/2017 02/18/2017 11/11/2016 03/18/2016  Does Patient Have a Medical Advance Directive? No No No No No No No  Type of Advance Directive - - - - - - -  Copy of Healthcare Power of Attorney in Chart? - - - - - - -  Would patient like information on creating a medical advance directive? No - Patient declined Yes (MAU/Ambulatory/Procedural Areas - Information given) - No - Patient declined - - No - patient declined information    Tobacco Social History   Tobacco Use  Smoking Status Never Smoker  Smokeless Tobacco Never Used  Tobacco Comment   smoking cessation materials not required     Counseling given: Not Answered Comment: smoking cessation materials not required   Clinical Intake:  Pre-visit preparation completed: Yes  Pain : No/denies pain     BMI - recorded: 27.46 Nutritional Status: BMI 25 -29 Overweight Nutritional Risks: None Diabetes: No  How often  do you need to have someone help you when you read instructions, pamphlets, or other written materials from your doctor or pharmacy?: 1 - Never  Interpreter Needed?: No  Information entered by :: Reather Littler LPN  Past Medical History:  Diagnosis Date  . Chronic UTI (urinary tract infection)   . HLD (hyperlipidemia)   . Hypertension   . Spina bifida (HCC)   . Vitamin D deficiency    Past Surgical History:  Procedure Laterality Date  . FEMUR SURGERY     broken bone  . HIP SURGERY    . SPINAL FIXATION SURGERY     Family History  Problem Relation Age of Onset  . Hyperlipidemia Mother   . Hypertension Mother   . Kidney cancer Maternal Grandmother   . Kidney disease Neg Hx   . Prostate cancer Neg Hx    Social History   Socioeconomic History  . Marital status: Single    Spouse name: Not on file  . Number of children: 0  . Years of education: Not on file  . Highest education level: 12th grade  Occupational History    Employer: [employer]  Social Needs  . Financial resource strain: Not hard at all  . Food insecurity    Worry: Never true    Inability: Never true  . Transportation needs    Medical: No    Non-medical: No  Tobacco Use  . Smoking status: Never Smoker  . Smokeless tobacco: Never Used  . Tobacco comment: smoking cessation materials  not required  Substance and Sexual Activity  . Alcohol use: No    Alcohol/week: 0.0 standard drinks  . Drug use: No  . Sexual activity: Not Currently  Lifestyle  . Physical activity    Days per week: 0 days    Minutes per session: 0 min  . Stress: Only a little  Relationships  . Social Musicianconnections    Talks on phone: Patient refused    Gets together: Patient refused    Attends religious service: Patient refused    Active member of club or organization: Patient refused    Attends meetings of clubs or organizations: Patient refused    Relationship status: Never married  Other Topics Concern  . Not on file  Social History  Narrative   Does not drive    Outpatient Encounter Medications as of 05/06/2019  Medication Sig  . lisinopril-hydrochlorothiazide (ZESTORETIC) 20-25 MG tablet Take 1 tablet by mouth daily.  . metoprolol succinate (TOPROL-XL) 50 MG 24 hr tablet Take with or immediately following a meal.  . nortriptyline (PAMELOR) 10 MG capsule TAKE ONE CAPSULE BY MOUTH AT BEDTIME  . Vitamin D, Ergocalciferol, (DRISDOL) 1.25 MG (50000 UT) CAPS capsule Take 1 capsule (50,000 Units total) by mouth every 7 (seven) days.  . Cholecalciferol (D3-1000) 1000 units capsule Take by mouth.  . rosuvastatin (CRESTOR) 20 MG tablet Take 1 tablet (20 mg total) by mouth daily. (Patient not taking: Reported on 05/06/2019)  . [DISCONTINUED] cephALEXin (KEFLEX) 500 MG capsule TK 1 C PO Q 8 H PRN. TAKE A 5 DAY COURSE PRF UTI   No facility-administered encounter medications on file as of 05/06/2019.     Activities of Daily Living In your present state of health, do you have any difficulty performing the following activities: 05/06/2019 03/18/2019  Hearing? N N  Comment declines hearing aids -  Vision? N N  Difficulty concentrating or making decisions? N N  Walking or climbing stairs? N N  Comment - -  Dressing or bathing? N N  Doing errands, shopping? N N  Preparing Food and eating ? N -  Using the Toilet? N -  In the past six months, have you accidently leaked urine? N -  Do you have problems with loss of bowel control? N -  Managing your Medications? N -  Managing your Finances? N -  Housekeeping or managing your Housekeeping? N -  Some recent data might be hidden    Patient Care Team: Alba CorySowles, Krichna, MD as PCP - General (Family Medicine) Rene PaciWinter, Christopher Aaron, MD as Consulting Physician (Urology)   Assessment:   This is a routine wellness examination for Miloroy.  Exercise Activities and Dietary recommendations Current Exercise Habits: The patient does not participate in regular exercise at present, Exercise  limited by: neurologic condition(s)  Goals    . DIET - INCREASE WATER INTAKE     Recommend to drink at least 6-8 8oz glasses of water per day.    . Eat more fruits and vegetables     Recommend to increase the amount of fruits and vegetables in daily diet to at least 1 serving of each.        Fall Risk Fall Risk  05/06/2019 03/18/2019 09/17/2018 04/30/2018 02/26/2018  Falls in the past year? 0 0 0 No No  Number falls in past yr: 0 0 0 - -  Injury with Fall? 0 0 0 - -  Risk for fall due to : - - - Impaired balance/gait;History of fall(s) -  Risk for fall due to: Comment - - - fell and broke R femur -  Follow up Falls prevention discussed - - - -   FALL RISK PREVENTION PERTAINING TO THE HOME:  Any stairs in or around the home? Yes  If so, do they handrails? Yes   Home free of loose throw rugs in walkways, pet beds, electrical cords, etc? Yes  Adequate lighting in your home to reduce risk of falls? Yes   ASSISTIVE DEVICES UTILIZED TO PREVENT FALLS:  Life alert? No  Use of a cane, walker or w/c? No  Grab bars in the bathroom? No  Shower chair or bench in shower? No  Elevated toilet seat or a handicapped toilet? No   DME ORDERS:  DME order needed?  No   TIMED UP AND GO:  Was the test performed? No . Telephonic visit.   Education: Fall risk prevention has been discussed.  Intervention(s) required? No   Depression Screen PHQ 2/9 Scores 05/06/2019 03/18/2019 09/17/2018 04/30/2018  PHQ - 2 Score 0 0 0 0  PHQ- 9 Score - 0 - 0    Cognitive Function     6CIT Screen 05/06/2019 04/30/2018  What Year? 0 points 0 points  What month? 0 points 0 points  What time? 0 points 0 points  Count back from 20 0 points 0 points  Months in reverse 0 points 2 points  Repeat phrase 2 points 2 points  Total Score 2 4    There is no immunization history for the selected administration types on file for this patient.  Qualifies for Shingles Vaccine? No    Tdap: Although this vaccine is not  a covered service during a Wellness Exam, does the patient still wish to receive this vaccine today?  No .  Education has been provided regarding the importance of this vaccine. Advised may receive this vaccine at local pharmacy or Health Dept. Aware to provide a copy of the vaccination record if obtained from local pharmacy or Health Dept. Verbalized acceptance and understanding.  Flu Vaccine: Due for Flu vaccine. Does the patient want to receive this vaccine today?  No . Education has been provided regarding the importance of this vaccine but still declined. Advised may receive this vaccine at local pharmacy or Health Dept. Aware to provide a copy of the vaccination record if obtained from local pharmacy or Health Dept. Verbalized acceptance and understanding.  Pneumococcal Vaccine: Due for Pneumococcal vaccine. At age 43  Screening Tests Health Maintenance  Topic Date Due  . Janet BerlinETANUS/TDAP  07/30/1994  . INFLUENZA VACCINE  04/09/2019  . HIV Screening  Completed   Cancer Screenings:  Colorectal Screening: Due at age 43.  Lung Cancer Screening: (Low Dose CT Chest recommended if Age 97-80 years, 30 pack-year currently smoking OR have quit w/in 15years.) does not qualify.    Additional Screening:  Hepatitis C Screening: does not qualify.  Vision Screening: Recommended annual ophthalmology exams for early detection of glaucoma and other disorders of the eye. Is the patient up to date with their annual eye exam?  No  Who is the provider or what is the name of the office in which the pt attends annual eye exams? Not established If pt is not established with a provider, would they like to be referred to a provider to establish care? No .   Dental Screening: Recommended annual dental exams for proper oral hygiene  Community Resource Referral:  CRR required this visit?  No  Plan:    I have personally reviewed and addressed the Medicare Annual Wellness questionnaire and have noted  the following in the patient's chart:  A. Medical and social history B. Use of alcohol, tobacco or illicit drugs  C. Current medications and supplements D. Functional ability and status E.  Nutritional status F.  Physical activity G. Advance directives H. List of other physicians I.  Hospitalizations, surgeries, and ER visits in previous 12 months J.  [place] such as hearing and vision if needed, cognitive and depression L. Referrals and appointments   In addition, I have reviewed and discussed with patient certain preventive protocols, quality metrics, and best practice recommendations. A written personalized care plan for preventive services as well as general preventive health recommendations were provided to patient.   Signed,  Clemetine Marker, LPN Nurse Health Advisor   Nurse Notes: pt c/o myalgia with statin use and has not been taking crestor. Advised pt due to labs to be checked and order has already been placed.

## 2019-09-22 DIAGNOSIS — N3001 Acute cystitis with hematuria: Secondary | ICD-10-CM | POA: Diagnosis not present

## 2019-09-22 DIAGNOSIS — N3021 Other chronic cystitis with hematuria: Secondary | ICD-10-CM | POA: Diagnosis not present

## 2019-09-22 DIAGNOSIS — R3914 Feeling of incomplete bladder emptying: Secondary | ICD-10-CM | POA: Diagnosis not present

## 2019-11-13 ENCOUNTER — Other Ambulatory Visit: Payer: Self-pay | Admitting: Family Medicine

## 2019-11-13 DIAGNOSIS — I10 Essential (primary) hypertension: Secondary | ICD-10-CM

## 2019-11-13 DIAGNOSIS — G43009 Migraine without aura, not intractable, without status migrainosus: Secondary | ICD-10-CM

## 2019-11-13 NOTE — Telephone Encounter (Signed)
30 day courtesy refill given. Please call and make appt for further refills Requested Prescriptions  Pending Prescriptions Disp Refills  . metoprolol succinate (TOPROL-XL) 50 MG 24 hr tablet [Pharmacy Med Name: METOPROLOL ER SUCCINATE 50MG  TABS] 30 tablet 0    Sig: TAKE 1 TABLET BY MOUTH WITH OR IMMEDIATELY FOLLOWING A MEAL     Cardiovascular:  Beta Blockers Failed - 11/13/2019  1:38 PM      Failed - Valid encounter within last 6 months    Recent Outpatient Visits          8 months ago Spina bifida aperta of lumbar spine [facility])   [facility], MD   1 year ago Hypertension, benign   [facility], MD   1 year ago Hypertension, benign   [facility], MD   1 year ago Hypertension, benign   [facility], NP   1 year ago Hypertension, benign   [facility], [place], MD      Future Appointments            In 5 months [facility]            Passed - Last BP in normal range    BP Readings from Last 1 Encounters:  05/06/19 126/80         Passed - Last Heart Rate in normal range    Pulse Readings from Last 1 Encounters:  03/18/19 75

## 2020-02-03 ENCOUNTER — Telehealth: Payer: Self-pay

## 2020-02-03 NOTE — Telephone Encounter (Signed)
Copied from CRM [id number]. Topic: General - Other >> Feb 02, 2020  4:28 PM [name] A wrote: Reason for CRM: Patient mother called to inquire of Dr Carlynn Purl if he would be a good candidate for the covid vaccine since he has underlying conditions. Please advise call Ph# [phone]  Called patient's mother to inform her that he it will be fine for him to get the covid vaccine regardless of his underlying conditions.

## 2020-02-08 DIAGNOSIS — Z23 Encounter for immunization: Secondary | ICD-10-CM | POA: Diagnosis not present

## 2020-02-23 ENCOUNTER — Other Ambulatory Visit: Payer: Self-pay

## 2020-02-23 DIAGNOSIS — I10 Essential (primary) hypertension: Secondary | ICD-10-CM

## 2020-02-23 DIAGNOSIS — G43009 Migraine without aura, not intractable, without status migrainosus: Secondary | ICD-10-CM

## 2020-02-23 MED ORDER — METOPROLOL SUCCINATE ER 50 MG PO TB24: ORAL_TABLET | ORAL | 0 refills | Status: DC

## 2020-03-30 ENCOUNTER — Other Ambulatory Visit: Payer: Self-pay | Admitting: Family Medicine

## 2020-03-30 DIAGNOSIS — I10 Essential (primary) hypertension: Secondary | ICD-10-CM

## 2020-05-10 ENCOUNTER — Ambulatory Visit: Payer: Medicare Other

## 2020-05-24 ENCOUNTER — Other Ambulatory Visit: Payer: Self-pay | Admitting: Family Medicine

## 2020-05-24 DIAGNOSIS — I10 Essential (primary) hypertension: Secondary | ICD-10-CM

## 2020-05-24 DIAGNOSIS — G43009 Migraine without aura, not intractable, without status migrainosus: Secondary | ICD-10-CM

## 2020-05-24 NOTE — Telephone Encounter (Signed)
Courtesy refill until appt 06/15/20

## 2020-06-14 NOTE — Progress Notes (Signed)
Name: Juan Hodges   MRN: [id number]    DOB: [birth year]   Date:06/15/2020       Progress Note  Subjective  Chief Complaint  Chief Complaint  Patient presents with  . Follow-up    HPI  HTN: he is now on Lisinopril/hctz and metoprolol, bpis at goal today. No chest pain, dizzinessor palpitation   Vitamin D deficiency: he has not been taking medication   Hyperlipidemia: last Lipid panel was very high but he was not taking medication, he was on Crestor but lost to follow up and has been without medication, discussed importance of regular follow ups and also a balanced diet   Migraine: he is on beta-blocker and was on nortriptyline, episodes were  less than once a week , over the past few months he has noticed increase in frequency to about twice a week, he states it started after he got COVID vaccine, advised to resume Nortriptyline and see if symptoms will improve Hedescribes it asthrobbing, frontal, no phonophobia but has phonophobia. No nausea or vomiting.  Neurogenic bladder/recurrent UTI:/spina bifida: seeing Urologist - Dr. Sande Brothers at [facility]   he is now taking Cephalexin prn symptoms. He states that symptoms usually consists of strong odor, still self cath at least 3 times per day. Gait is stable, he wears foot braces to assist with his gaitHe has not been seeing neurologistin many years.   Abdominal cramping: on upper abdomen, not associated with nausea, vomiting, change in bowel movements, fever or change in appetite, started a few days ago, symptoms resolved with Gas X  Patient Active Problem List   Diagnosis Date Noted  . Self-catheterizes urinary bladder 04/27/2017  . H/O fracture of hip 01/01/2016  . Vitamin D deficiency 01/01/2016  . 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 Hypertension, benign 01/01/2016  . Neurogenic bladder 12/19/2009  . Migraine without aura and without status migrainosus, not intractable 11/09/2009  . Chronic  urinary tract infection 08/27/2007    Past Surgical History:  Procedure Laterality Date  . FEMUR SURGERY     broken bone  . HIP SURGERY    . SPINAL FIXATION SURGERY      Family History  Problem Relation Age of Onset  . Hyperlipidemia Mother   . Hypertension Mother   . Kidney cancer Maternal Grandmother   . Kidney disease Neg Hx   . Prostate cancer Neg Hx     Social History   Tobacco Use  . Smoking status: Never Smoker  . Smokeless tobacco: Never Used  . Tobacco comment: smoking cessation materials not required  Substance Use Topics  . Alcohol use: No    Alcohol/week: 0.0 standard drinks     Current Outpatient Medications:  .  Cholecalciferol (D3-1000) 1000 units capsule, Take by mouth., Disp: , Rfl:  .  lisinopril-hydrochlorothiazide (ZESTORETIC) 20-25 MG tablet, TAKE 1 TABLET BY MOUTH DAILY, Disp: 90 tablet, Rfl: 0 .  metoprolol succinate (TOPROL-XL) 50 MG 24 hr tablet, TAKE 1 TABLET BY MOUTH EVERY DAY WITH OR IMMEDIATELY FOLLOWING A MEAL, Disp: 22 tablet, Rfl: 0 .  nortriptyline (PAMELOR) 10 MG capsule, TAKE ONE CAPSULE BY MOUTH AT BEDTIME, Disp: 90 capsule, Rfl: 1 .  rosuvastatin (CRESTOR) 20 MG tablet, Take 1 tablet (20 mg total) by mouth daily., Disp: 90 tablet, Rfl: 1 .  Vitamin D, Ergocalciferol, (DRISDOL) 1.25 MG (50000 UT) CAPS capsule, Take 1 capsule (50,000 Units total) by mouth every 7 (seven) days., Disp: 12 capsule, Rfl: 0  Allergies  Allergen Reactions  . Augmentin [Amoxicillin-Pot Clavulanate]     GI distress    I personally reviewed active problem list, medication list, allergies, family history, social history, health maintenance with the patient/caregiver today.   ROS  Constitutional: Negative for fever or weight change.  Respiratory: Negative for cough and shortness of breath.   Cardiovascular: Negative for chest pain or palpitations.  Gastrointestinal: positive for abdominal pain - but improved today , no bowel changes.  Musculoskeletal:  Positive  for gait problem but not  joint swelling.  Skin: Negative for rash.  Neurological: Negative for dizziness or headache.  No other specific complaints in a complete review of systems (except as listed in HPI above).  Objective  Vitals:   06/15/20 1110  BP: 118/70  Pulse: 95  Resp: 18  Temp: 98 F (36.7 C)  SpO2: 97%  Weight: 158 lb (71.7 kg)  Height: 5\' 4"  (1.626 m)    Body mass index is 27.12 kg/m.  Physical Exam  Constitutional: Patient appears well-developed and well-nourished. Overweight. No distress.  HEENT: head atraumatic, normocephalic, pupils equal and reactive to light,  neck supple Cardiovascular: Normal rate, regular rhythm and normal heart sounds.  No murmur heard. No BLE edema. Pulmonary/Chest: Effort normal and breath sounds normal. No respiratory distress. Abdominal: Soft.  There is no tenderness. Neurological: normal sensation, has foot braces, atrophy lower legs , but normal quads  Psychiatric: Patient has a normal mood and affect. behavior is normal. Judgment and thought content normal.  PHQ2/9: Depression screen [facility] 2/9 06/15/2020 05/06/2019 03/18/2019 09/17/2018 04/30/2018  Decreased Interest 0 0 0 0 0  Down, Depressed, Hopeless 0 0 0 0 0  PHQ - 2 Score 0 0 0 0 0  Altered sleeping - - 0 - 0  Tired, decreased energy - - 0 - 0  Change in appetite - - 0 - 0  Feeling bad or failure about yourself  - - 0 - 0  Trouble concentrating - - 0 - 0  Moving slowly or fidgety/restless - - 0 - 0  Suicidal thoughts - - 0 - 0  PHQ-9 Score - - 0 - 0  Difficult doing work/chores - - - - Not difficult at all    phq 9 is negative   Fall Risk: Fall Risk  06/15/2020 05/06/2019 03/18/2019 09/17/2018 04/30/2018  Falls in the past year? 0 0 0 0 No  Number falls in past yr: 0 0 0 0 -  Injury with Fall? 0 0 0 0 -  Risk for fall due to : - - - - Impaired balance/gait;History of fall(s)  Risk for fall due to: Comment - - - - fell and broke R femur  Follow up - Falls  prevention discussed - - -     Functional Status Survey: Is the patient deaf or have difficulty hearing?: No Does the patient have difficulty seeing, even when wearing glasses/contacts?: No Does the patient have difficulty concentrating, remembering, or making decisions?: No Does the patient have difficulty walking or climbing stairs?: Yes Does the patient have difficulty dressing or bathing?: No Does the patient have difficulty doing errands alone such as visiting a doctor's office or shopping?: Yes    Assessment & Plan  1. 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 Self-catheterizes urinary bladder   3. Vitamin D deficiency  - VITAMIN D 25 Hydroxy (Vit-D Deficiency, Fractures) - Vitamin D, Ergocalciferol, (DRISDOL) 1.25 MG (50000 UNIT) CAPS capsule; Take 1 capsule (50,000 Units total) by  mouth every 7 (seven) days.  Dispense: 12 capsule; Refill: 1  4. Neurogenic bladder   5. Migraine without aura and without status migrainosus, not intractable  - metoprolol succinate (TOPROL-XL) 50 MG 24 hr tablet; Take with or immediately following a meal.  Dispense: 90 tablet; Refill: 1 - nortriptyline (PAMELOR) 10 MG capsule; Take 1 capsule (10 mg total) by mouth at bedtime.  Dispense: 90 capsule; Refill: 1  6. Need for hepatitis C screening test  - Hepatitis C antibody  7. Hyperglycemia  - Hemoglobin A1c  8. Hypertension, benign  - CBC with Differential/Platelet - COMPLETE METABOLIC PANEL WITH GFR - lisinopril-hydrochlorothiazide (ZESTORETIC) 20-25 MG tablet; Take 1 tablet by mouth daily.  Dispense: 90 tablet; Refill: 1 - metoprolol succinate (TOPROL-XL) 50 MG 24 hr tablet; Take with or immediately following a meal.  Dispense: 90 tablet; Refill: 1  9. Gait disturbance  stable  10. Pure hypercholesterolemia  - Lipid panel - rosuvastatin (CRESTOR) 20 MG tablet; Take 1 tablet (20 mg total) by mouth daily.  Dispense: 90 tablet; Refill: 1  Patient is refusing  vaccinations: pneumonia, flu and meningococcal

## 2020-06-15 ENCOUNTER — Encounter: Payer: Self-pay | Admitting: Family Medicine

## 2020-06-15 ENCOUNTER — Other Ambulatory Visit: Payer: Self-pay

## 2020-06-15 ENCOUNTER — Ambulatory Visit (INDEPENDENT_AMBULATORY_CARE_PROVIDER_SITE_OTHER): Payer: Medicare Other | Admitting: Family Medicine

## 2020-06-15 VITALS — BP 118/70 | HR 95 | Temp 98.0°F | Resp 18 | Ht 64.0 in | Wt 158.0 lb

## 2020-06-15 DIAGNOSIS — Z1159 Encounter for screening for other viral diseases: Secondary | ICD-10-CM | POA: Diagnosis not present

## 2020-06-15 DIAGNOSIS — E559 Vitamin D deficiency, unspecified: Secondary | ICD-10-CM

## 2020-06-15 DIAGNOSIS — I10 Essential (primary) hypertension: Secondary | ICD-10-CM

## 2020-06-15 DIAGNOSIS — E7800 Pure hypercholesterolemia, unspecified: Secondary | ICD-10-CM | POA: Diagnosis not present

## 2020-06-15 DIAGNOSIS — Z789 Other specified health status: Secondary | ICD-10-CM | POA: Diagnosis not present

## 2020-06-15 DIAGNOSIS — R739 Hyperglycemia, unspecified: Secondary | ICD-10-CM | POA: Diagnosis not present

## 2020-06-15 DIAGNOSIS — R269 Unspecified abnormalities of gait and mobility: Secondary | ICD-10-CM

## 2020-06-15 DIAGNOSIS — G43009 Migraine without aura, not intractable, without status migrainosus: Secondary | ICD-10-CM

## 2020-06-15 DIAGNOSIS — Q057 Lumbar spina bifida without hydrocephalus: Secondary | ICD-10-CM

## 2020-06-15 DIAGNOSIS — N319 Neuromuscular dysfunction of bladder, unspecified: Secondary | ICD-10-CM

## 2020-06-15 MED ORDER — LISINOPRIL-HYDROCHLOROTHIAZIDE 20-25 MG PO TABS: 1.0000 | ORAL_TABLET | Freq: Every day | ORAL | 1 refills | Status: DC

## 2020-06-15 MED ORDER — VITAMIN D (ERGOCALCIFEROL) 1.25 MG (50000 UNIT) PO CAPS: 50000.0000 [IU] | ORAL_CAPSULE | ORAL | 1 refills | Status: DC

## 2020-06-15 MED ORDER — ROSUVASTATIN CALCIUM 20 MG PO TABS: 20.0000 mg | ORAL_TABLET | Freq: Every day | ORAL | 1 refills | Status: DC

## 2020-06-15 MED ORDER — NORTRIPTYLINE HCL 10 MG PO CAPS: 10.0000 mg | ORAL_CAPSULE | Freq: Every day | ORAL | 1 refills | Status: DC

## 2020-06-15 MED ORDER — METOPROLOL SUCCINATE ER 50 MG PO TB24: ORAL_TABLET | ORAL | 1 refills | Status: DC

## 2020-06-18 LAB — VITAMIN D 25 HYDROXY (VIT D DEFICIENCY, FRACTURES): Vit D, 25-Hydroxy: 24 ng/mL — ABNORMAL LOW (ref 30–100)

## 2020-06-18 LAB — CBC WITH DIFFERENTIAL/PLATELET
Absolute Monocytes: 452 cells/uL (ref 200–950)
Basophils Absolute: 31 cells/uL (ref 0–200)
Basophils Relative: 0.6 %
Eosinophils Absolute: 62 cells/uL (ref 15–500)
Eosinophils Relative: 1.2 %
HCT: 53 % — ABNORMAL HIGH (ref 38.5–50.0)
Hemoglobin: 17.4 g/dL — ABNORMAL HIGH (ref 13.2–17.1)
Lymphs Abs: 1602 cells/uL (ref 850–3900)
MCH: 28.9 pg (ref 27.0–33.0)
MCHC: 32.8 g/dL (ref 32.0–36.0)
MCV: 88 fL (ref 80.0–100.0)
MPV: 9.5 fL (ref 7.5–12.5)
Monocytes Relative: 8.7 %
Neutro Abs: 3052 cells/uL (ref 1500–7800)
Neutrophils Relative %: 58.7 %
Platelets: 310 10*3/uL (ref 140–400)
RBC: 6.02 10*6/uL — ABNORMAL HIGH (ref 4.20–5.80)
RDW: 12.3 % (ref 11.0–15.0)
Total Lymphocyte: 30.8 %
WBC: 5.2 10*3/uL (ref 3.8–10.8)

## 2020-06-18 LAB — COMPLETE METABOLIC PANEL WITH GFR
AG Ratio: 1.5 (calc) (ref 1.0–2.5)
ALT: 16 U/L (ref 9–46)
AST: 16 U/L (ref 10–40)
Albumin: 4.5 g/dL (ref 3.6–5.1)
Alkaline phosphatase (APISO): 79 U/L (ref 36–130)
BUN: 23 mg/dL (ref 7–25)
CO2: 34 mmol/L — ABNORMAL HIGH (ref 20–32)
Calcium: 10.1 mg/dL (ref 8.6–10.3)
Chloride: 96 mmol/L — ABNORMAL LOW (ref 98–110)
Creat: 1.19 mg/dL (ref 0.60–1.35)
GFR, Est African American: 86 mL/min/{1.73_m2} (ref >=60)
GFR, Est Non African American: 74 mL/min/{1.73_m2} (ref >=60)
Globulin: 3.1 g/dL (calc) (ref 1.9–3.7)
Glucose, Bld: 97 mg/dL (ref 65–99)
Potassium: 4 mmol/L (ref 3.5–5.3)
Sodium: 137 mmol/L (ref 135–146)
Total Bilirubin: 0.8 mg/dL (ref 0.2–1.2)
Total Protein: 7.6 g/dL (ref 6.1–8.1)

## 2020-06-18 LAB — LIPID PANEL
Cholesterol: 259 mg/dL — ABNORMAL HIGH (ref <200)
HDL: 59 mg/dL (ref >=40)
LDL Cholesterol (Calc): 176 mg/dL (calc) — ABNORMAL HIGH
Non-HDL Cholesterol (Calc): 200 mg/dL (calc) — ABNORMAL HIGH (ref <130)
Total CHOL/HDL Ratio: 4.4 (calc) (ref <5.0)
Triglycerides: 112 mg/dL (ref <150)

## 2020-06-18 LAB — HEPATITIS C ANTIBODY
Hepatitis C Ab: NONREACTIVE
SIGNAL TO CUT-OFF: 0.01 (ref <1.00)

## 2020-06-18 LAB — HEMOGLOBIN A1C
Hgb A1c MFr Bld: 5.4 % of total Hgb (ref <5.7)
Mean Plasma Glucose: 108 (calc)
eAG (mmol/L): 6 (calc)

## 2020-06-22 ENCOUNTER — Telehealth: Payer: Self-pay

## 2020-06-22 NOTE — Telephone Encounter (Signed)
Pt returning call. Please advise. °

## 2020-06-22 NOTE — Telephone Encounter (Signed)
Copied from CRM [id number]. Topic: General - Inquiry >> Jun 22, 2020  1:29 PM [name] D wrote: Reason for CRM: Patient would like a call back from Dr. Carlynn Purl nurse in reference to the results of his lab work, no one has contacted him yet. He can be reached at [phone]. Please advise

## 2020-06-25 NOTE — Telephone Encounter (Signed)
I have tried to contact patient twice to discuss his lab results. No answer, LVM to rtc to clinic.

## 2020-06-26 NOTE — Telephone Encounter (Signed)
done

## 2020-06-26 NOTE — Telephone Encounter (Signed)
Pts mother called stating that she is needing to have a copy of the pts lab work. She is also requesting to have nurse give her a call. Please advise.       [phone]

## 2020-06-27 ENCOUNTER — Telehealth: Payer: Self-pay

## 2020-06-27 NOTE — Telephone Encounter (Signed)
Copied from CRM [id number]. Topic: General - Inquiry >> Jun 26, 2020  4:03 PM [name] D wrote: Reason for CRM: patient called and wanted to ask about his red blood cells being high, and if it was something he could take or do for it. He can be reached at [phone]. Please advise.

## 2020-07-31 ENCOUNTER — Ambulatory Visit (INDEPENDENT_AMBULATORY_CARE_PROVIDER_SITE_OTHER): Payer: Medicare Other

## 2020-07-31 DIAGNOSIS — Z0000 Encounter for general adult medical examination without abnormal findings: Secondary | ICD-10-CM | POA: Diagnosis not present

## 2020-07-31 NOTE — Patient Instructions (Signed)
Mr. Juan Hodges , Thank you for taking time to come for your Medicare Wellness Visit. I appreciate your ongoing commitment to your health goals. Please review the following plan we discussed and let me know if I can assist you in the future.   Screening recommendations/referrals: Colonoscopy: due age 44 Recommended yearly ophthalmology/optometry visit for glaucoma screening and checkup Recommended yearly dental visit for hygiene and checkup  Vaccinations: Influenza vaccine: declined Pneumococcal vaccine: due at age 45 Tdap vaccine: due Shingles vaccine: due age 45   Covid-19: done 02/08/20 & 02/29/20  Advanced directives: Advance directive discussed with you today. Even though you declined this today please call our office should you change your mind and we can give you the proper paperwork for you to fill out.  Conditions/risks identified: Recommend increasing physical activity as tolerated  Next appointment: Follow up in one year for your annual wellness visit   Preventive Care 40-64 Years, Male Preventive care refers to lifestyle choices and visits with your health care provider that can promote health and wellness. What does preventive care include?  A yearly physical exam. This is also called an annual well check.  Dental exams once or twice a year.  Routine eye exams. Ask your health care provider how often you should have your eyes checked.  Personal lifestyle choices, including:  Daily care of your teeth and gums.  Regular physical activity.  Eating a healthy diet.  Avoiding tobacco and drug use.  Limiting alcohol use.  Practicing safe sex.  Taking low-dose aspirin every day starting at age 40. What happens during an annual well check? The services and screenings done by your health care provider during your annual well check will depend on your age, overall health, lifestyle risk factors, and family history of disease. Counseling  Your health care provider may ask  you questions about your:  Alcohol use.  Tobacco use.  Drug use.  Emotional well-being.  Home and relationship well-being.  Sexual activity.  Eating habits.  Work and work Astronomer. Screening  You may have the following tests or measurements:  Height, weight, and BMI.  Blood pressure.  Lipid and cholesterol levels. These may be checked every 5 years, or more frequently if you are over 21 years old.  Skin check.  Lung cancer screening. You may have this screening every year starting at age 28 if you have a 30-pack-year history of smoking and currently smoke or have quit within the past 15 years.  Fecal occult blood test (FOBT) of the stool. You may have this test every year starting at age 36.  Flexible sigmoidoscopy or colonoscopy. You may have a sigmoidoscopy every 5 years or a colonoscopy every 10 years starting at age 71.  Prostate cancer screening. Recommendations will vary depending on your family history and other risks.  Hepatitis C blood test.  Hepatitis B blood test.  Sexually transmitted disease (STD) testing.  Diabetes screening. This is done by checking your blood sugar (glucose) after you have not eaten for a while (fasting). You may have this done every 1-3 years. Discuss your test results, treatment options, and if necessary, the need for more tests with your health care provider. Vaccines  Your health care provider may recommend certain vaccines, such as:  Influenza vaccine. This is recommended every year.  Tetanus, diphtheria, and acellular pertussis (Tdap, Td) vaccine. You may need a Td booster every 10 years.  Zoster vaccine. You may need this after age 60.  Pneumococcal 13-valent conjugate (PCV13) vaccine. You  may need this if you have certain conditions and have not been vaccinated.  Pneumococcal polysaccharide (PPSV23) vaccine. You may need one or two doses if you smoke cigarettes or if you have certain conditions. Talk to your health  care provider about which screenings and vaccines you need and how often you need them. This information is not intended to replace advice given to you by your health care provider. Make sure you discuss any questions you have with your health care provider. Document Released: 09/21/2015 Document Revised: 05/14/2016 Document Reviewed: 06/26/2015 Elsevier Interactive Patient Education  2017 [employer].  Fall Prevention in the Home Falls can cause injuries. They can happen to people of all ages. There are many things you can do to make your home safe and to help prevent falls. What can I do on the outside of my home?  Regularly fix the edges of walkways and driveways and fix any cracks.  Remove anything that might make you trip as you walk through a door, such as a raised step or threshold.  Trim any bushes or trees on the path to your home.  Use bright outdoor lighting.  Clear any walking paths of anything that might make someone trip, such as rocks or tools.  Regularly check to see if handrails are loose or broken. Make sure that both sides of any steps have handrails.  Any raised decks and porches should have guardrails on the edges.  Have any leaves, snow, or ice cleared regularly.  Use sand or salt on walking paths during winter.  Clean up any spills in your garage right away. This includes oil or grease spills. What can I do in the bathroom?  Use night lights.  Install grab bars by the toilet and in the tub and shower. Do not use towel bars as grab bars.  Use non-skid mats or decals in the tub or shower.  If you need to sit down in the shower, use a plastic, non-slip stool.  Keep the floor dry. Clean up any water that spills on the floor as soon as it happens.  Remove soap buildup in the tub or shower regularly.  Attach bath mats securely with double-sided non-slip rug tape.  Do not have throw rugs and other things on the floor that can make you trip. What can I do  in the bedroom?  Use night lights.  Make sure that you have a light by your bed that is easy to reach.  Do not use any sheets or blankets that are too big for your bed. They should not hang down onto the floor.  Have a firm chair that has side arms. You can use this for support while you get dressed.  Do not have throw rugs and other things on the floor that can make you trip. What can I do in the kitchen?  Clean up any spills right away.  Avoid walking on wet floors.  Keep items that you use a lot in easy-to-reach places.  If you need to reach something above you, use a strong step stool that has a grab bar.  Keep electrical cords out of the way.  Do not use floor polish or wax that makes floors slippery. If you must use wax, use non-skid floor wax.  Do not have throw rugs and other things on the floor that can make you trip. What can I do with my stairs?  Do not leave any items on the stairs.  Make sure that there  are handrails on both sides of the stairs and use them. Fix handrails that are broken or loose. Make sure that handrails are as long as the stairways.  Check any carpeting to make sure that it is firmly attached to the stairs. Fix any carpet that is loose or worn.  Avoid having throw rugs at the top or bottom of the stairs. If you do have throw rugs, attach them to the floor with carpet tape.  Make sure that you have a light switch at the top of the stairs and the bottom of the stairs. If you do not have them, ask someone to add them for you. What else can I do to help prevent falls?  Wear shoes that:  Do not have high heels.  Have rubber bottoms.  Are comfortable and fit you well.  Are closed at the toe. Do not wear sandals.  If you use a stepladder:  Make sure that it is fully opened. Do not climb a closed stepladder.  Make sure that both sides of the stepladder are locked into place.  Ask someone to hold it for you, if possible.  Clearly mark  and make sure that you can see:  Any grab bars or handrails.  First and last steps.  Where the edge of each step is.  Use tools that help you move around (mobility aids) if they are needed. These include:  Canes.  Walkers.  Scooters.  Crutches.  Turn on the lights when you go into a dark area. Replace any light bulbs as soon as they burn out.  Set up your furniture so you have a clear path. Avoid moving your furniture around.  If any of your floors are uneven, fix them.  If there are any pets around you, be aware of where they are.  Review your medicines with your doctor. Some medicines can make you feel dizzy. This can increase your chance of falling. Ask your doctor what other things that you can do to help prevent falls. This information is not intended to replace advice given to you by your health care provider. Make sure you discuss any questions you have with your health care provider. Document Released: 06/21/2009 Document Revised: 01/31/2016 Document Reviewed: 09/29/2014 Elsevier Interactive Patient Education  2017 [employer].

## 2020-07-31 NOTE — Progress Notes (Signed)
Subjective:   Juan Hodges is a 44 y.o. male who presents for Medicare Annual/Subsequent preventive examination.  Virtual Visit via Telephone Note  I connected with  Juan Hodges on 07/31/20 at 10:00 AM EST by telephone and verified that I am speaking with the correct person using two identifiers.  Location: Patient: home Provider: [facility] Persons participating in the virtual visit: patient/Nurse Health Advisor   I discussed the limitations, risks, security and privacy concerns of performing an evaluation and management service by telephone and the availability of in person appointments. The patient expressed understanding and agreed to proceed.  Interactive audio and video telecommunications were attempted between this nurse and patient, however failed, due to patient having technical difficulties OR patient did not have access to video capability.  We continued and completed visit with audio only.  Some vital signs may be absent or patient reported.   Reather Littler, LPN    Review of Systems     Cardiac Risk Factors include: male gender;hypertension;dyslipidemia     Objective:    There were no vitals filed for this visit. There is no height or weight on file to calculate BMI.  Advanced Directives 07/31/2020 05/06/2019 04/30/2018 04/27/2017 04/27/2017 02/18/2017 11/11/2016  Does Patient Have a Medical Advance Directive? No No No No No No No  Type of Advance Directive - - - - - - -  Copy of Healthcare Power of Attorney in Chart? - - - - - - -  Would patient like information on creating a medical advance directive? No - Patient declined No - Patient declined Yes (MAU/Ambulatory/Procedural Areas - Information given) - No - Patient declined - -    Current Medications (verified) Outpatient Encounter Medications as of 07/31/2020  Medication Sig  . Cholecalciferol (D3-1000) 1000 units capsule Take by mouth.  Marland Kitchen lisinopril-hydrochlorothiazide (ZESTORETIC) 20-25 MG tablet Take 1 tablet  by mouth daily.  . metoprolol succinate (TOPROL-XL) 50 MG 24 hr tablet Take with or immediately following a meal.  . nortriptyline (PAMELOR) 10 MG capsule Take 1 capsule (10 mg total) by mouth at bedtime.  . rosuvastatin (CRESTOR) 20 MG tablet Take 1 tablet (20 mg total) by mouth daily.  . Vitamin D, Ergocalciferol, (DRISDOL) 1.25 MG (50000 UNIT) CAPS capsule Take 1 capsule (50,000 Units total) by mouth every 7 (seven) days.   No facility-administered encounter medications on file as of 07/31/2020.    Allergies (verified) Augmentin [amoxicillin-pot clavulanate]   History: Past Medical History:  Diagnosis Date  . Chronic UTI (urinary tract infection)   . HLD (hyperlipidemia)   . Hypertension   . Spina bifida (HCC)   . Vitamin D deficiency    Past Surgical History:  Procedure Laterality Date  . FEMUR SURGERY     broken bone  . HIP SURGERY    . SPINAL FIXATION SURGERY     Family History  Problem Relation Age of Onset  . Hyperlipidemia Mother   . Hypertension Mother   . Kidney cancer Maternal Grandmother   . Kidney disease Neg Hx   . Prostate cancer Neg Hx    Social History   Socioeconomic History  . Marital status: Single    Spouse name: Not on file  . Number of children: 0  . Years of education: Not on file  . Highest education level: 12th grade  Occupational History    Employer: [employer]  Tobacco Use  . Smoking status: Never Smoker  . Smokeless tobacco: Never Used  . Tobacco comment: smoking cessation  materials not required  Vaping Use  . Vaping Use: Never used  Substance and Sexual Activity  . Alcohol use: No    Alcohol/week: 0.0 standard drinks  . Drug use: No  . Sexual activity: Not Currently  Other Topics Concern  . Not on file  Social History Narrative   Does not drive   Social Determinants of Health   Financial Resource Strain: Low Risk   . Difficulty of Paying Living Expenses: Not hard at all  Food Insecurity: No Food Insecurity  . Worried About  Programme researcher, broadcasting/film/video in the Last Year: Never true  . Ran Out of Food in the Last Year: Never true  Transportation Needs: No Transportation Needs  . Lack of Transportation (Medical): No  . Lack of Transportation (Non-Medical): No  Physical Activity: Inactive  . Days of Exercise per Week: 0 days  . Minutes of Exercise per Session: 0 min  Stress: No Stress Concern Present  . Feeling of Stress : Not at all  Social Connections: Socially Isolated  . Frequency of Communication with Friends and Family: More than three times a week  . Frequency of Social Gatherings with Friends and Family: More than three times a week  . Attends Religious Services: Never  . Active Member of Clubs or Organizations: No  . Attends Banker Meetings: Never  . Marital Status: Never married    Tobacco Counseling Counseling given: Not Answered Comment: smoking cessation materials not required   Clinical Intake:  Pre-visit preparation completed: Yes  Pain : No/denies pain     Nutritional Risks: None Diabetes: No  How often do you need to have someone help you when you read instructions, pamphlets, or other written materials from your doctor or pharmacy?: 1 - Never    Interpreter Needed?: No  Information entered by :: Reather Littler LPN   Activities of Daily Living In your present state of health, do you have any difficulty performing the following activities: 07/31/2020 06/15/2020  Hearing? N N  Comment declines hearing aids -  Vision? N N  Difficulty concentrating or making decisions? N N  Walking or climbing stairs? Y Y  Dressing or bathing? N N  Doing errands, shopping? Y Y  Using the Toilet? N -  In the past six months, have you accidently leaked urine? Y -  Comment wears depends -  Do you have problems with loss of bowel control? Y -  Managing your Medications? N -  Managing your Finances? N -  Housekeeping or managing your Housekeeping? N -  Some recent data might be hidden     Patient Care Team: Alba Cory, MD as PCP - General (Family Medicine) Rene Paci, MD as Consulting Physician (Urology)  Indicate any recent Medical Services you may have received from other than [facility] providers in the past year (date may be approximate).     Assessment:   This is a routine wellness examination for Juan Hodges.  Hearing/Vision screen  Hearing Screening   125Hz  250Hz  500Hz  1000Hz  2000Hz  3000Hz  4000Hz  6000Hz  8000Hz   Right ear:           Left ear:           Comments: Pt denies hearing difficulty  Vision Screening Comments: Past due for annual vision screening has not been since a child  Dietary issues and exercise activities discussed: Current Exercise Habits: The patient does not participate in regular exercise at present, Exercise limited by: neurologic condition(s)  Goals    .  DIET - INCREASE WATER INTAKE     Recommend to drink at least 6-8 8oz glasses of water per day.    . Eat more fruits and vegetables     Recommend to increase the amount of fruits and vegetables in daily diet to at least 1 serving of each.       Depression Screen PHQ 2/9 Scores 07/31/2020 06/15/2020 05/06/2019 03/18/2019 09/17/2018 04/30/2018 02/26/2018  PHQ - 2 Score 0 0 0 0 0 0 0  PHQ- 9 Score - - - 0 - 0 -    Fall Risk Fall Risk  07/31/2020 06/15/2020 05/06/2019 03/18/2019 09/17/2018  Falls in the past year? 0 0 0 0 0  Number falls in past yr: 0 0 0 0 0  Injury with Fall? 0 0 0 0 0  Risk for fall due to : No Fall Risks - - - -  Risk for fall due to: Comment - - - - -  Follow up Falls prevention discussed - Falls prevention discussed - -    Any stairs in or around the home? Yes  If so, are there any without handrails? No  Home free of loose throw rugs in walkways, pet beds, electrical cords, etc? Yes  Adequate lighting in your home to reduce risk of falls? Yes   ASSISTIVE DEVICES UTILIZED TO PREVENT FALLS:  Life alert? No  Use of a cane, walker or w/c? No  Grab bars  in the bathroom? No  Shower chair or bench in shower? No  Elevated toilet seat or a handicapped toilet? No   TIMED UP AND GO:  Was the test performed? No . Telephonic visit  Cognitive Function: Normal cognitive status assessed by direct observation by this Nurse Health Advisor.       6CIT Screen 05/06/2019 04/30/2018  What Year? 0 points 0 points  What month? 0 points 0 points  What time? 0 points 0 points  Count back from 20 0 points 0 points  Months in reverse 0 points 2 points  Repeat phrase 2 points 2 points  Total Score 2 4    Immunizations Immunization History  Administered Date(s) Administered  . PFIZER SARS-COV-2 Vaccination 02/08/2020, 02/29/2020    TDAP status: Due, Education has been provided regarding the importance of this vaccine. Advised may receive this vaccine at local pharmacy or Health Dept. Aware to provide a copy of the vaccination record if obtained from local pharmacy or Health Dept. Verbalized acceptance and understanding.   Flu Vaccine status: Declined, Education has been provided regarding the importance of this vaccine but patient still declined. Advised may receive this vaccine at local pharmacy or Health Dept. Aware to provide a copy of the vaccination record if obtained from local pharmacy or Health Dept. Verbalized acceptance and understanding.   Pneumococcal vaccine status: due at age 89  Covid-19 vaccine status: Completed vaccines  Qualifies for Shingles Vaccine? No  due ate age 44  Screening Tests Health Maintenance  Topic Date Due  . INFLUENZA VACCINE  12/06/2020 (Originally 04/08/2020)  . TETANUS/TDAP  06/15/2021 (Originally 07/30/1994)  . COVID-19 Vaccine  Completed  . Hepatitis C Screening  Completed  . HIV Screening  Completed    Health Maintenance  There are no preventive care reminders to display for this patient.  Colorectal cancer screening status: due at age 44  Lung Cancer Screening: (Low Dose CT Chest recommended if Age  61-80 years, 30 pack-year currently smoking OR have quit w/in 15years.) does not qualify.   Additional  Screening:  Hepatitis C Screening: does qualify; Completed 06/15/20  Vision Screening: Recommended annual ophthalmology exams for early detection of glaucoma and other disorders of the eye. Is the patient up to date with their annual eye exam?  No  Who is the provider or what is the name of the office in which the patient attends annual eye exams? Not established If pt is not established with a provider, would they like to be referred to a provider to establish care? No .   Dental Screening: Recommended annual dental exams for proper oral hygiene  Community Resource Referral / Chronic Care Management: CRR required this visit?  No   CCM required this visit?  No      Plan:     I have personally reviewed and noted the following in the patient's chart:   . Medical and social history . Use of alcohol, tobacco or illicit drugs  . Current medications and supplements . Functional ability and status . Nutritional status . Physical activity . Advanced directives . List of other physicians . Hospitalizations, surgeries, and ER visits in previous 12 months . Vitals . Screenings to include cognitive, depression, and falls . Referrals and appointments  In addition, I have reviewed and discussed with patient certain preventive protocols, quality metrics, and best practice recommendations. A written personalized care plan for preventive services as well as general preventive health recommendations were provided to patient.     Reather Littler, LPN   [id number]   Nurse Notes: pt states he is concerned about iron and RBC levels elevated; denies testosterone use and declines sleep study at this time. Pt advised to call back or schedule appt prior to follow up for further information.

## 2020-12-14 NOTE — Progress Notes (Signed)
Name: Juan Hodges   MRN: [id number]    DOB: October 11, 1975   Date:12/17/2020       Progress Note  Subjective  Chief Complaint  Follow Up  HPI  HTN: he is now on Lisinopril/hctz and metoprolol, bp today is towards high end of normal, but it was lower last visit.  No chest pain, dizzinessor palpitation   Vitamin D deficiency: he has been taking rx vitamin D   Hyperlipidemia: last Lipid panel was very high but he was not taking medication, he has been compliant with Crestor since last visit and we will recheck labs today. Denies myalgias  Migraine: he is on beta-blocker and was on nortriptyline, episodes are about once a week , he does not take any prn medication, he is able to stay at work but when he gets home he needs to take a nap  Hedescribes it asthrobbing, frontal, no phonophobia but has phonophobia. No nausea or vomiting.  Neurogenic bladder/recurrent UTI:/spina bifida: seeing Urologist - Dr. Sande Brothers at [facility]   he is now taking Cephalexin prn symptoms. He states that symptoms usually consists of strong odor, still self cath at least 3 times per day. Gait is stable, he wears foot braces to assist with his gaitHe has not been seeing neurologistin many years. Unchanged    Patient Active Problem List   Diagnosis Date Noted  . Self-catheterizes urinary bladder 04/27/2017  . H/O fracture of hip 01/01/2016  . Vitamin D deficiency 01/01/2016  . 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 Hypertension, benign 01/01/2016  . Neurogenic bladder 12/19/2009  . Migraine without aura and without status migrainosus, not intractable 11/09/2009  . Chronic urinary tract infection 08/27/2007    Past Surgical History:  Procedure Laterality Date  . FEMUR SURGERY     broken bone  . HIP SURGERY    . SPINAL FIXATION SURGERY      Family History  Problem Relation Age of Onset  . Hyperlipidemia Mother   . Hypertension Mother   . Kidney cancer Maternal  Grandmother   . Kidney disease Neg Hx   . Prostate cancer Neg Hx     Social History   Tobacco Use  . Smoking status: Never Smoker  . Smokeless tobacco: Never Used  . Tobacco comment: smoking cessation materials not required  Substance Use Topics  . Alcohol use: No    Alcohol/week: 0.0 standard drinks     Current Outpatient Medications:  .  cephALEXin (KEFLEX) 500 MG capsule, Take 1,000 mg by mouth every 8 (eight) hours as needed., Disp: , Rfl:  .  Cholecalciferol 25 MCG (1000 UT) capsule, Take by mouth., Disp: , Rfl:  .  lisinopril-hydrochlorothiazide (ZESTORETIC) 20-25 MG tablet, Take 1 tablet by mouth daily., Disp: 90 tablet, Rfl: 1 .  metoprolol succinate (TOPROL-XL) 50 MG 24 hr tablet, Take with or immediately following a meal., Disp: 90 tablet, Rfl: 1 .  nortriptyline (PAMELOR) 10 MG capsule, Take 1 capsule (10 mg total) by mouth at bedtime., Disp: 90 capsule, Rfl: 1 .  rosuvastatin (CRESTOR) 20 MG tablet, Take 1 tablet (20 mg total) by mouth daily., Disp: 90 tablet, Rfl: 1 .  Vitamin D, Ergocalciferol, (DRISDOL) 1.25 MG (50000 UNIT) CAPS capsule, Take 1 capsule (50,000 Units total) by mouth every 7 (seven) days., Disp: 12 capsule, Rfl: 1  Allergies  Allergen Reactions  . Augmentin [Amoxicillin-Pot Clavulanate]     GI distress    I personally reviewed active problem list, medication list, allergies,  family history, social history, health maintenance with the patient/caregiver today.   ROS  Constitutional: Negative for fever or weight change.  Respiratory: Negative for cough and shortness of breath.   Cardiovascular: Negative for chest pain or palpitations.  Gastrointestinal: Negative for abdominal pain, no bowel changes.  Musculoskeletal: positive  for gait problem or joint swelling.  Skin: Negative for rash.  Neurological: Negative for dizziness, positive for intermittent  headache.  No other specific complaints in a complete review of systems (except as listed in  HPI above).  Objective  Vitals:   12/17/20 1104  BP: 132/86  Pulse: 96  Resp: 16  Temp: 98.5 F (36.9 C)  TempSrc: Oral  SpO2: 99%  Weight: 161 lb (73 kg)  Height: 5\' 4"  (1.626 m)    Body mass index is 27.64 kg/m.  Physical Exam  Constitutional: Patient appears well-developed and well-nourished. Overweight No distress.  HEENT: head atraumatic, normocephalic, pupils equal and reactive to light,  neck supple Cardiovascular: Normal rate, regular rhythm and normal heart sounds.  No murmur heard. No BLE edema. Pulmonary/Chest: Effort normal and breath sounds normal. No respiratory distress. Abdominal: Soft.  There is no tenderness. Muscular skeletal: atrophy lower legs, wears a brace to assist with gait Psychiatric: Patient has a normal mood and affect. behavior is normal. Judgment and thought content normal.  PHQ2/9: Depression screen [facility] 2/9 12/17/2020 07/31/2020 06/15/2020 05/06/2019 03/18/2019  Decreased Interest 0 0 0 0 0  Down, Depressed, Hopeless 0 0 0 0 0  PHQ - 2 Score 0 0 0 0 0  Altered sleeping - - - - 0  Tired, decreased energy - - - - 0  Change in appetite - - - - 0  Feeling bad or failure about yourself  - - - - 0  Trouble concentrating - - - - 0  Moving slowly or fidgety/restless - - - - 0  Suicidal thoughts - - - - 0  PHQ-9 Score - - - - 0  Difficult doing work/chores - - - - -    phq 9 is negative   Fall Risk: Fall Risk  12/17/2020 07/31/2020 06/15/2020 05/06/2019 03/18/2019  Falls in the past year? 0 0 0 0 0  Number falls in past yr: 0 0 0 0 0  Injury with Fall? 0 0 0 0 0  Risk for fall due to : - No Fall Risks - - -  Risk for fall due to: Comment - - - - -  Follow up - Falls prevention discussed - Falls prevention discussed -     Functional Status Survey: Is the patient deaf or have difficulty hearing?: No Does the patient have difficulty seeing, even when wearing glasses/contacts?: No Does the patient have difficulty concentrating, remembering, or  making decisions?: No Does the patient have difficulty walking or climbing stairs?: Yes Does the patient have difficulty dressing or bathing?: No Does the patient have difficulty doing errands alone such as visiting a doctor's office or shopping?: No    Assessment & Plan  1. Hypertension, benign  - lisinopril-hydrochlorothiazide (ZESTORETIC) 20-25 MG tablet; Take 1 tablet by mouth daily.  Dispense: 90 tablet; Refill: 1 - metoprolol succinate (TOPROL-XL) 50 MG 24 hr tablet; Take with or immediately following a meal.  Dispense: 90 tablet; Refill: 1 - COMPLETE METABOLIC PANEL WITH GFR - CBC with Differential/Platelet  2. Migraine without aura and without status migrainosus, not intractable  - metoprolol succinate (TOPROL-XL) 50 MG 24 hr tablet; Take with or immediately following a  meal.  Dispense: 90 tablet; Refill: 1 - nortriptyline (PAMELOR) 10 MG capsule; Take 1 capsule (10 mg total) by mouth at bedtime.  Dispense: 90 capsule; Refill: 1 - SUMAtriptan (IMITREX) 100 MG tablet; Take 1 tablet (100 mg total) by mouth every 2 (two) hours as needed for migraine. May repeat in 2 hours if headache persists or recurs.  Dispense: 10 tablet; Refill: 1  3. Pure hypercholesterolemia  - rosuvastatin (CRESTOR) 20 MG tablet; Take 1 tablet (20 mg total) by mouth daily.  Dispense: 90 tablet; Refill: 1 - Lipid panel  4. Vitamin D deficiency  - Vitamin D, Ergocalciferol, (DRISDOL) 1.25 MG (50000 UNIT) CAPS capsule; Take 1 capsule (50,000 Units total) by mouth every 7 (seven) days.  Dispense: 12 capsule; Refill: 1  5. 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 Self-catheterizes urinary bladder   7. Elevated red blood cell count  - CBC with Differential/Platelet.

## 2020-12-17 ENCOUNTER — Ambulatory Visit (INDEPENDENT_AMBULATORY_CARE_PROVIDER_SITE_OTHER): Payer: Medicare Other | Admitting: Family Medicine

## 2020-12-17 ENCOUNTER — Encounter: Payer: Self-pay | Admitting: Family Medicine

## 2020-12-17 ENCOUNTER — Other Ambulatory Visit: Payer: Self-pay

## 2020-12-17 VITALS — BP 132/86 | HR 96 | Temp 98.5°F | Resp 16 | Ht 64.0 in | Wt 161.0 lb

## 2020-12-17 DIAGNOSIS — I10 Essential (primary) hypertension: Secondary | ICD-10-CM

## 2020-12-17 DIAGNOSIS — Q057 Lumbar spina bifida without hydrocephalus: Secondary | ICD-10-CM

## 2020-12-17 DIAGNOSIS — R718 Other abnormality of red blood cells: Secondary | ICD-10-CM

## 2020-12-17 DIAGNOSIS — E7800 Pure hypercholesterolemia, unspecified: Secondary | ICD-10-CM

## 2020-12-17 DIAGNOSIS — Z789 Other specified health status: Secondary | ICD-10-CM

## 2020-12-17 DIAGNOSIS — E559 Vitamin D deficiency, unspecified: Secondary | ICD-10-CM

## 2020-12-17 DIAGNOSIS — G43009 Migraine without aura, not intractable, without status migrainosus: Secondary | ICD-10-CM | POA: Diagnosis not present

## 2020-12-17 MED ORDER — SUMATRIPTAN SUCCINATE 100 MG PO TABS: 100.0000 mg | ORAL_TABLET | ORAL | 1 refills | Status: DC | PRN

## 2020-12-17 MED ORDER — METOPROLOL SUCCINATE ER 50 MG PO TB24: ORAL_TABLET | ORAL | 1 refills | Status: DC

## 2020-12-17 MED ORDER — VITAMIN D (ERGOCALCIFEROL) 1.25 MG (50000 UNIT) PO CAPS: 50000.0000 [IU] | ORAL_CAPSULE | ORAL | 1 refills | Status: DC

## 2020-12-17 MED ORDER — ROSUVASTATIN CALCIUM 20 MG PO TABS: 20.0000 mg | ORAL_TABLET | Freq: Every day | ORAL | 1 refills | Status: DC

## 2020-12-17 MED ORDER — NORTRIPTYLINE HCL 10 MG PO CAPS: 10.0000 mg | ORAL_CAPSULE | Freq: Every day | ORAL | 1 refills | Status: DC

## 2020-12-17 MED ORDER — LISINOPRIL-HYDROCHLOROTHIAZIDE 20-25 MG PO TABS: 1.0000 | ORAL_TABLET | Freq: Every day | ORAL | 1 refills | Status: DC

## 2020-12-18 LAB — COMPLETE METABOLIC PANEL WITH GFR
AG Ratio: 1.4 (calc) (ref 1.0–2.5)
ALT: 19 U/L (ref 9–46)
AST: 15 U/L (ref 10–40)
Albumin: 4.4 g/dL (ref 3.6–5.1)
Alkaline phosphatase (APISO): 82 U/L (ref 36–130)
BUN: 19 mg/dL (ref 7–25)
CO2: 34 mmol/L — ABNORMAL HIGH (ref 20–32)
Calcium: 10.1 mg/dL (ref 8.6–10.3)
Chloride: 102 mmol/L (ref 98–110)
Creat: 1.09 mg/dL (ref 0.60–1.35)
GFR, Est African American: 95 mL/min/{1.73_m2} (ref >=60)
GFR, Est Non African American: 82 mL/min/{1.73_m2} (ref >=60)
Globulin: 3.2 g/dL (calc) (ref 1.9–3.7)
Glucose, Bld: 102 mg/dL — ABNORMAL HIGH (ref 65–99)
Potassium: 4.5 mmol/L (ref 3.5–5.3)
Sodium: 144 mmol/L (ref 135–146)
Total Bilirubin: 0.5 mg/dL (ref 0.2–1.2)
Total Protein: 7.6 g/dL (ref 6.1–8.1)

## 2020-12-18 LAB — LIPID PANEL
Cholesterol: 261 mg/dL — ABNORMAL HIGH (ref <200)
HDL: 61 mg/dL (ref >=40)
LDL Cholesterol (Calc): 184 mg/dL (calc) — ABNORMAL HIGH
Non-HDL Cholesterol (Calc): 200 mg/dL (calc) — ABNORMAL HIGH (ref <130)
Total CHOL/HDL Ratio: 4.3 (calc) (ref <5.0)
Triglycerides: 56 mg/dL (ref <150)

## 2020-12-18 LAB — CBC WITH DIFFERENTIAL/PLATELET
Absolute Monocytes: 381 cells/uL (ref 200–950)
Basophils Absolute: 28 cells/uL (ref 0–200)
Basophils Relative: 0.5 %
Eosinophils Absolute: 39 cells/uL (ref 15–500)
Eosinophils Relative: 0.7 %
HCT: 54.6 % — ABNORMAL HIGH (ref 38.5–50.0)
Hemoglobin: 17.4 g/dL — ABNORMAL HIGH (ref 13.2–17.1)
Lymphs Abs: 1680 cells/uL (ref 850–3900)
MCH: 27.9 pg (ref 27.0–33.0)
MCHC: 31.9 g/dL — ABNORMAL LOW (ref 32.0–36.0)
MCV: 87.6 fL (ref 80.0–100.0)
MPV: 9.4 fL (ref 7.5–12.5)
Monocytes Relative: 6.8 %
Neutro Abs: 3472 cells/uL (ref 1500–7800)
Neutrophils Relative %: 62 %
Platelets: 350 10*3/uL (ref 140–400)
RBC: 6.23 10*6/uL — ABNORMAL HIGH (ref 4.20–5.80)
RDW: 12 % (ref 11.0–15.0)
Total Lymphocyte: 30 %
WBC: 5.6 10*3/uL (ref 3.8–10.8)

## 2020-12-19 ENCOUNTER — Other Ambulatory Visit: Payer: Self-pay | Admitting: Family Medicine

## 2020-12-19 DIAGNOSIS — E7800 Pure hypercholesterolemia, unspecified: Secondary | ICD-10-CM

## 2020-12-19 DIAGNOSIS — E7849 Other hyperlipidemia: Secondary | ICD-10-CM

## 2020-12-19 MED ORDER — EZETIMIBE 10 MG PO TABS: 10.0000 mg | ORAL_TABLET | Freq: Every day | ORAL | 3 refills | Status: DC

## 2020-12-19 MED ORDER — ROSUVASTATIN CALCIUM 10 MG PO TABS: 10.0000 mg | ORAL_TABLET | Freq: Every day | ORAL | 1 refills | Status: DC

## 2020-12-24 ENCOUNTER — Telehealth: Payer: Self-pay | Admitting: Family Medicine

## 2020-12-24 NOTE — Telephone Encounter (Signed)
Pt is calling back regarding his lab results from 12/17/20 [id number]

## 2020-12-24 NOTE — Telephone Encounter (Signed)
Called pt and went back over lab results. Reminded him to pick up Zetia for hyperlipidemia

## 2020-12-26 ENCOUNTER — Ambulatory Visit: Payer: Self-pay | Admitting: *Deleted

## 2020-12-26 NOTE — Telephone Encounter (Signed)
Pt. States he was confused about the new prescription for Zetia. Instructed it is for his cholesterol and he will take it in addition to Crestor. Verbalizes understanding.

## 2020-12-26 NOTE — Telephone Encounter (Signed)
Pt wants to know why Dr Carlynn Purl added the Zetia? In other words, what will this do for him, and why did she add? He states it was not quite clear. He has not start the zetia. He is skeptical.  Attempted to contact patient to review medication, zetia. No answer, left message to call clinic back .

## 2021-01-03 DIAGNOSIS — N3020 Other chronic cystitis without hematuria: Secondary | ICD-10-CM | POA: Diagnosis not present

## 2021-01-03 DIAGNOSIS — N1330 Unspecified hydronephrosis: Secondary | ICD-10-CM | POA: Diagnosis not present

## 2021-01-03 DIAGNOSIS — R3914 Feeling of incomplete bladder emptying: Secondary | ICD-10-CM | POA: Diagnosis not present

## 2021-01-03 DIAGNOSIS — Q059 Spina bifida, unspecified: Secondary | ICD-10-CM | POA: Diagnosis not present

## 2021-05-09 ENCOUNTER — Other Ambulatory Visit: Payer: Self-pay

## 2021-05-09 ENCOUNTER — Emergency Department
Admission: EM | Admit: 2021-05-09 | Discharge: 2021-05-09 | Disposition: A | Discharge status: Home / Self Care | Payer: Medicare Other | Attending: Emergency Medicine | Admitting: Emergency Medicine

## 2021-05-09 ENCOUNTER — Emergency Department: Payer: Medicare Other

## 2021-05-09 ENCOUNTER — Encounter: Payer: Self-pay | Admitting: Emergency Medicine

## 2021-05-09 DIAGNOSIS — R519 Headache, unspecified: Secondary | ICD-10-CM | POA: Diagnosis present

## 2021-05-09 DIAGNOSIS — Z79899 Other long term (current) drug therapy: Secondary | ICD-10-CM | POA: Diagnosis not present

## 2021-05-09 DIAGNOSIS — G43909 Migraine, unspecified, not intractable, without status migrainosus: Secondary | ICD-10-CM | POA: Diagnosis not present

## 2021-05-09 DIAGNOSIS — I10 Essential (primary) hypertension: Secondary | ICD-10-CM | POA: Diagnosis not present

## 2021-05-09 DIAGNOSIS — Z982 Presence of cerebrospinal fluid drainage device: Secondary | ICD-10-CM | POA: Diagnosis not present

## 2021-05-09 DIAGNOSIS — G9389 Other specified disorders of brain: Secondary | ICD-10-CM | POA: Diagnosis not present

## 2021-05-09 MED ORDER — METOCLOPRAMIDE HCL 5 MG/ML IJ SOLN
20.0000 mg | Freq: Once | INTRAVENOUS | Status: AC
  Administered 2021-05-09: 20 mg via INTRAVENOUS
  Filled 2021-05-09: qty 4

## 2021-05-09 MED ORDER — DIPHENHYDRAMINE HCL 50 MG/ML IJ SOLN
25.0000 mg | Freq: Once | INTRAMUSCULAR | Status: AC
  Administered 2021-05-09: 25 mg via INTRAVENOUS
  Filled 2021-05-09: qty 1

## 2021-05-09 MED ORDER — KETOROLAC TROMETHAMINE 30 MG/ML IJ SOLN
30.0000 mg | Freq: Once | INTRAMUSCULAR | Status: AC
  Administered 2021-05-09: 30 mg via INTRAVENOUS
  Filled 2021-05-09: qty 1

## 2021-05-09 MED ORDER — SODIUM CHLORIDE 0.9 % IV BOLUS
500.0000 mL | Freq: Once | INTRAVENOUS | Status: AC
  Administered 2021-05-09: 500 mL via INTRAVENOUS

## 2021-05-09 NOTE — ED Provider Notes (Signed)
MSE was initiated and I personally evaluated the patient and placed orders (if any) at  6:40 AM on May 09, 2021.  The patient appears stable so that the remainder of the MSE may be completed by another provider.  The patient came for evaluation of acute onset severe right-sided headache that started last night.  It feels different from his usual migraines.  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  Patient is in no distress and has stable vital signs other than some hypertension.  He is neurologically intact and said he feels better than before.  Deferring additional evaluation to oncoming physician.   Loleta Rose, MD 05/09/21 [phone]

## 2021-05-09 NOTE — ED Provider Notes (Signed)
[facility] Emergency Department Provider Note   ____________________________________________    I have reviewed the triage vital signs and the nursing notes.   HISTORY  Chief Complaint Headache     HPI Juan Hodges is a 45 y.o. male with history of spina bifida including VP shunt placed as a child which he reports is nonfunctional presents with complaints of severe right-sided headache.  Patient describes headache behind his right eye.  Started yesterday morning and continued throughout the day.  He was unable to sleep because of the headache.  No neurodeficits reported.  Mild nausea.  Does have a history of migraine headaches but this feels more severe.  Has not take anything for this.  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 Authorizing Provider  cephALEXin (KEFLEX) 500 MG capsule Take 1,000 mg by mouth every 8 (eight) hours as needed. 09/11/20   Rene Paci, MD  Cholecalciferol 25 MCG (1000 UT) capsule Take by mouth.    [provider]  ezetimibe (ZETIA) 10 MG tablet Take 1 tablet (10 mg  total) by mouth daily. 12/19/20   Alba Cory, MD  lisinopril-hydrochlorothiazide (ZESTORETIC) 20-25 MG tablet Take 1 tablet by mouth daily. 12/17/20   Alba Cory, MD  metoprolol succinate (TOPROL-XL) 50 MG 24 hr tablet Take with or immediately following a meal. 12/17/20   Sowles, Danna Hefty, MD  nortriptyline (PAMELOR) 10 MG capsule Take 1 capsule (10 mg total) by mouth at bedtime. 12/17/20   Alba Cory, MD  rosuvastatin (CRESTOR) 10 MG tablet Take 1 tablet (10 mg total) by mouth daily. 12/19/20   Alba Cory, MD  SUMAtriptan (IMITREX) 100 MG tablet Take 1 tablet (100 mg total) by mouth every 2 (two) hours as needed for migraine. May repeat in 2 hours if headache persists or recurs. 12/17/20   Alba Cory, MD  Vitamin D, Ergocalciferol, (DRISDOL) 1.25 MG (50000 UNIT) CAPS capsule Take 1 capsule (50,000 Units total) by mouth every 7 (seven) days. 12/17/20   Alba Cory, MD     Allergies Augmentin [amoxicillin-pot clavulanate]  Family History  Problem Relation Age of Onset   Hyperlipidemia Mother    Hypertension Mother    Kidney cancer Maternal Grandmother    Kidney disease Neg Hx    Prostate cancer Neg Hx     Social History Social History   Tobacco Use   Smoking status: Never   Smokeless tobacco: Never   Tobacco comments:    smoking cessation materials not required  Vaping Use   Vaping Use: Never used  Substance Use Topics   Alcohol use: No    Alcohol/week: 0.0 standard drinks   Drug use: No    Review of Systems  Constitutional: No fever reported Eyes: No change in vision ENT: No sore throat.  No neck pain Cardiovascular: Denies chest pain. Respiratory: Denies shortness of breath. Gastrointestinal: No abdominal pain.  No nausea, no vomiting.   Genitourinary: Negative for dysuria. Musculoskeletal: Negative for back pain. Skin: Negative for rash. Neurological: As above   ____________________________________________   PHYSICAL EXAM:  VITAL  SIGNS: ED Triage Vitals  Enc Vitals Group     BP 05/09/21 0604 (!) 177/100     Pulse Rate 05/09/21 0604 79     Resp 05/09/21 0604 18     Temp 05/09/21 0604 98.7 F (37.1 C)     Temp Source 05/09/21 0604 Oral     SpO2 05/09/21 0604 99 %     Weight 05/09/21 0605 55.8 kg (123 lb)     Height 05/09/21 0605 1.626 m (5\' 4" )     Head Circumference --      Peak Flow --      Pain Score 05/09/21 0605 6     Pain Loc --      Pain Edu? --      Excl. in GC? --    PERRLA, EOMI Constitutional: Alert and oriented. No acute distress. Pleasant and interactive Eyes: Conjunctivae are normal.  Head: Atraumatic. Nose: No congestion/rhinnorhea. Mouth/Throat: Mucous membranes are moist.    Cardiovascular: Normal rate, regular rhythm.  Respiratory: Normal respiratory effort.  No retractions.  Musculoskeletal:   Warm and well perfused Neurologic:  Normal speech and language. No gross focal neurologic deficits are appreciated.  Cranial nerves II through XII normal Skin:  Skin is warm, dry and intact. No rash noted. Psychiatric: Mood and affect are normal. Speech and behavior are normal.  ____________________________________________   LABS (all labs ordered are listed, but only abnormal results are displayed)  Labs Reviewed - No data to display ____________________________________________  EKG   ____________________________________________  RADIOLOGY  CT head reviewed by me ____________________________________________   PROCEDURES  Procedure(s) performed: No  Procedures   Critical Care performed: No ____________________________________________   INITIAL IMPRESSION / ASSESSMENT AND PLAN / ED COURSE  Pertinent labs & imaging results that were available during my care of the patient were reviewed by me and considered in my medical decision making (see chart for details).   Patient presents with headache as detailed above, suspect migraine however given history of VP shunt and more  severe headache and typical will obtain CT imaging.  Treat with IV Reglan, Benadryl, fluids and Toradol   ----------------------------------------- 8:16 AM on 05/09/2021 ----------------------------------------- Patient feeling improved    ----------------------------------------- 9:15 AM on 05/09/2021 ----------------------------------------- Patient's headache is nearly entirely resolved, appropriate for discharge    ____________________________________________   FINAL CLINICAL IMPRESSION(S) / ED DIAGNOSES  Final diagnoses:  Migraine without status migrainosus, not intractable, unspecified migraine type        Note:  This document was prepared using Dragon voice recognition software and may include unintentional dictation errors.    07/09/2021, MD 05/09/21 1258

## 2021-05-09 NOTE — ED Notes (Signed)
Pt transported to CT at this time.

## 2021-05-09 NOTE — ED Notes (Signed)
ED Provider at bedside. 

## 2021-05-09 NOTE — ED Triage Notes (Signed)
Patient ambulatory to triage with steady gait, without difficulty or distress noted; pt reports pain to rt side of head and behind rt eye since last night ; st hx migraines; denies any accomp symptoms

## 2021-06-10 ENCOUNTER — Other Ambulatory Visit: Payer: Self-pay | Admitting: Family Medicine

## 2021-06-10 DIAGNOSIS — E7800 Pure hypercholesterolemia, unspecified: Secondary | ICD-10-CM

## 2021-06-10 DIAGNOSIS — I10 Essential (primary) hypertension: Secondary | ICD-10-CM

## 2021-06-10 DIAGNOSIS — G43009 Migraine without aura, not intractable, without status migrainosus: Secondary | ICD-10-CM

## 2021-06-10 DIAGNOSIS — E7849 Other hyperlipidemia: Secondary | ICD-10-CM

## 2021-06-19 ENCOUNTER — Ambulatory Visit (INDEPENDENT_AMBULATORY_CARE_PROVIDER_SITE_OTHER): Payer: Medicare Other | Admitting: Family Medicine

## 2021-06-19 ENCOUNTER — Other Ambulatory Visit: Payer: Self-pay

## 2021-06-19 ENCOUNTER — Encounter: Payer: Self-pay | Admitting: Family Medicine

## 2021-06-19 VITALS — BP 142/104 | HR 95 | Temp 98.2°F | Resp 18 | Ht 63.0 in | Wt 166.6 lb

## 2021-06-19 DIAGNOSIS — Q057 Lumbar spina bifida without hydrocephalus: Secondary | ICD-10-CM | POA: Diagnosis not present

## 2021-06-19 DIAGNOSIS — Z789 Other specified health status: Secondary | ICD-10-CM | POA: Diagnosis not present

## 2021-06-19 DIAGNOSIS — R269 Unspecified abnormalities of gait and mobility: Secondary | ICD-10-CM

## 2021-06-19 DIAGNOSIS — N319 Neuromuscular dysfunction of bladder, unspecified: Secondary | ICD-10-CM

## 2021-06-19 DIAGNOSIS — R739 Hyperglycemia, unspecified: Secondary | ICD-10-CM

## 2021-06-19 DIAGNOSIS — G43009 Migraine without aura, not intractable, without status migrainosus: Secondary | ICD-10-CM | POA: Diagnosis not present

## 2021-06-19 DIAGNOSIS — E559 Vitamin D deficiency, unspecified: Secondary | ICD-10-CM | POA: Diagnosis not present

## 2021-06-19 DIAGNOSIS — E7849 Other hyperlipidemia: Secondary | ICD-10-CM

## 2021-06-19 DIAGNOSIS — Z23 Encounter for immunization: Secondary | ICD-10-CM

## 2021-06-19 DIAGNOSIS — I10 Essential (primary) hypertension: Secondary | ICD-10-CM | POA: Diagnosis not present

## 2021-06-19 DIAGNOSIS — Z982 Presence of cerebrospinal fluid drainage device: Secondary | ICD-10-CM

## 2021-06-19 MED ORDER — LISINOPRIL-HYDROCHLOROTHIAZIDE 20-25 MG PO TABS: 1.0000 | ORAL_TABLET | Freq: Every day | ORAL | 1 refills | Status: DC

## 2021-06-19 MED ORDER — METOPROLOL SUCCINATE ER 50 MG PO TB24: ORAL_TABLET | ORAL | 1 refills | Status: DC

## 2021-06-19 MED ORDER — SUMATRIPTAN SUCCINATE 100 MG PO TABS: 100.0000 mg | ORAL_TABLET | ORAL | 1 refills | Status: DC | PRN

## 2021-06-19 MED ORDER — AMLODIPINE BESYLATE 2.5 MG PO TABS: 2.5000 mg | ORAL_TABLET | Freq: Every day | ORAL | 0 refills | Status: DC

## 2021-06-19 MED ORDER — VITAMIN D (ERGOCALCIFEROL) 1.25 MG (50000 UNIT) PO CAPS: 50000.0000 [IU] | ORAL_CAPSULE | ORAL | 1 refills | Status: DC

## 2021-06-19 MED ORDER — UBRELVY 100 MG PO TABS: 1.0000 | ORAL_TABLET | ORAL | 2 refills | Status: DC | PRN

## 2021-06-19 NOTE — Progress Notes (Signed)
Name: Juan Hodges   MRN: [id number]    DOB: 1976-01-31   Date:06/19/2021       Progress Note  Subjective  Chief Complaint  Follow Up  HPI  HTN: he is now on Lisinopril/hctz and metoprolol, bp today is also above goal, 6 months ago it was 149/95 and he has not been checking it at home. He states more stressed at work.    Vitamin D deficiency: he has been taking rx vitamin D .    Hyperlipidemia: last Lipid panel was very high but he was not taking medication, he stopped taking Crestor and is not sure if taking Zetia because he did not know what it was for.    Migraine: he is on beta-blocker and was on nortriptyline, episodes are about once a week. He describes it as throbbing, frontal, no phonophobia but has phonophobia. No nausea or vomiting. He had to go to [facility] for the first time early September due to stress at work. 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 He states Imitrex causes upset stomach at times, we will try Ubrelvy prn    Neurogenic bladder/recurrent UTI:/spina bifida: seeing Urologist - Dr. Sande Brothers at [facility]   he is now taking Cephalexin prn symptoms. He states that symptoms usually consists of strong odor but no problems at this time,  still self cath at least 3 times per day.  Gait is stable , he wears foot braces to assist with his gait He has not been seeing neurologist in many years.   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, reportedly non-functional for years    Family History  Problem Relation Age of Onset   Hyperlipidemia Mother    Hypertension Mother    Kidney cancer Maternal Grandmother    Kidney disease Neg Hx    Prostate cancer Neg Hx     Social History   Tobacco Use   Smoking status: Never   Smokeless tobacco: Never   Tobacco comments:    smoking cessation materials not required  Substance Use Topics   Alcohol use: No    Alcohol/week: 0.0 standard drinks     Current Outpatient Medications:    cephALEXin (KEFLEX) 500 MG capsule, Take 1,000 mg by mouth every 8 (eight) hours as needed., Disp: , Rfl:    Cholecalciferol 25 MCG (1000 UT) capsule, Take by mouth., Disp: , Rfl:    ezetimibe (ZETIA) 10 MG tablet, Take 1 tablet (10 mg total) by mouth daily., Disp: 90 tablet, Rfl: 3   lisinopril-hydrochlorothiazide (ZESTORETIC) 20-25 MG tablet, Take 1 tablet by mouth daily., Disp: 90 tablet, Rfl: 1   metoprolol succinate (TOPROL-XL) 50 MG 24 hr tablet, Take with or immediately following a meal., Disp: 90 tablet, Rfl: 1   nortriptyline (PAMELOR) 10 MG capsule, Take 1 capsule (  10 mg total) by mouth at bedtime., Disp: 90 capsule, Rfl: 1   rosuvastatin (CRESTOR) 10 MG tablet, Take 1 tablet (10 mg total) by mouth daily., Disp: 90 tablet, Rfl: 1   SUMAtriptan (IMITREX) 100 MG tablet, Take 1 tablet (100 mg total) by mouth every 2 (two) hours as needed for migraine. May repeat in 2 hours if headache persists or recurs., Disp: 10 tablet, Rfl: 1   Vitamin D, Ergocalciferol, (DRISDOL) 1.25 MG (50000 UNIT) CAPS capsule, Take 1 capsule (50,000 Units total) by mouth every 7 (seven) days., Disp: 12 capsule, Rfl: 1  Allergies  Allergen Reactions   Augmentin [Amoxicillin-Pot Clavulanate]     GI distress    I personally reviewed active problem list, medication list, allergies, family history, social history, health  maintenance with the patient/caregiver today.   ROS  Constitutional: Negative for fever or weight change.  Respiratory: Negative for cough and shortness of breath.   Cardiovascular: Negative for chest pain or palpitations.  Gastrointestinal: Negative for abdominal pain, no bowel changes.  Musculoskeletal: positive or gait problem but no  joint swelling.  Skin: Negative for rash.  Neurological: Negative for dizziness , positive for intermittent headache.  No other specific complaints in a complete review of systems (except as listed in HPI above).   Objective  Vitals:   06/19/21 1055  BP: (!) 146/102  Pulse: 95  Resp: 18  Temp: 98.2 F (36.8 C)  TempSrc: Oral  SpO2: 97%  Weight: 166 lb 9.6 oz (75.6 kg)  Height: 5\' 3"  (1.6 m)    Body mass index is 29.51 kg/m.  Physical Exam  Constitutional: Patient appears well-developed and well-nourished. Overweight.  No distress.  HEENT: head atraumatic, normocephalic, pupils equal and reactive to light, neck supple Cardiovascular: Normal rate, regular rhythm and normal heart sounds.  No murmur heard. No BLE edema. Pulmonary/Chest: Effort normal and breath sounds normal. No respiratory distress. Abdominal: Soft.  There is no tenderness. Psychiatric: Patient has a normal mood and affect. behavior is normal. Judgment and thought content normal.  Muscular skeletal: slow gait , wears braces, spastic walk    PHQ2/9: Depression screen [facility] 2/9 06/19/2021 12/17/2020 07/31/2020 06/15/2020 05/06/2019  Decreased Interest 0 0 0 0 0  Down, Depressed, Hopeless 0 0 0 0 0  PHQ - 2 Score 0 0 0 0 0  Altered sleeping - - - - -  Tired, decreased energy - - - - -  Change in appetite - - - - -  Feeling bad or failure about yourself  - - - - -  Trouble concentrating - - - - -  Moving slowly or fidgety/restless - - - - -  Suicidal thoughts - - - - -  PHQ-9 Score - - - - -  Difficult doing work/chores - - - - -    phq 9 is negative   Fall  Risk: Fall Risk  06/19/2021 12/17/2020 07/31/2020 06/15/2020 05/06/2019  Falls in the past year? 0 0 0 0 0  Number falls in past yr: 0 0 0 0 0  Injury with Fall? 0 0 0 0 0  Risk for fall due to : No Fall Risks - No Fall Risks - -  Risk for fall due to: Comment - - - - -  Follow up Falls prevention discussed - Falls prevention discussed - Falls prevention discussed      Functional Status Survey: Is the patient deaf or have difficulty hearing?: No Does the patient have difficulty concentrating, remembering, or  making decisions?: No Does the patient have difficulty walking or climbing stairs?: Yes Does the patient have difficulty dressing or bathing?: No Does the patient have difficulty doing errands alone such as visiting a doctor's office or shopping?: Yes    Assessment & Plan  1. Hypertension, benign  - metoprolol succinate (TOPROL-XL) 50 MG 24 hr tablet; Take with or immediately following a meal.  Dispense: 90 tablet; Refill: 1 - lisinopril-hydrochlorothiazide (ZESTORETIC) 20-25 MG tablet; Take 1 tablet by mouth daily.  Dispense: 90 tablet; Refill: 1 - amLODipine (NORVASC) 2.5 MG tablet; Take 1 tablet (2.5 mg total) by mouth daily.  Dispense: 90 tablet; Refill: 0 - Urine Microalbumin w/creat. ratio  2. Migraine without aura and without status migrainosus, not intractable  - metoprolol succinate (TOPROL-XL) 50 MG 24 hr tablet; Take with or immediately following a meal.  Dispense: 90 tablet; Refill: 1 - SUMAtriptan (IMITREX) 100 MG tablet; Take 1 tablet (100 mg total) by mouth every 2 (two) hours as needed for migraine. May repeat in 2 hours if headache persists or recurs.  Dispense: 10 tablet; Refill: 1  3. Familial hyperlipidemia   4. Self-catheterizes urinary bladder   5. 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 History of brain shunt   11. Need for pneumococcal vaccine  - Pneumococcal polysaccharide vaccine 23-valent greater than or equal to 2yo  subcutaneous/IM   6. Neurogenic bladder   7. Gait disturbance   8. Hyperglycemia   9. Vitamin D deficiency  - Vitamin D, Ergocalciferol, (DRISDOL) 1.25 MG (50000 UNIT) CAPS capsule; Take 1 capsule (50,000 Units total) by mouth every 7 (seven) days.  Dispense: 12 capsule; Refill: 1

## 2021-07-01 ENCOUNTER — Other Ambulatory Visit: Payer: Self-pay | Admitting: Family Medicine

## 2021-07-01 DIAGNOSIS — E7800 Pure hypercholesterolemia, unspecified: Secondary | ICD-10-CM

## 2021-07-31 ENCOUNTER — Other Ambulatory Visit: Payer: Self-pay

## 2021-07-31 ENCOUNTER — Ambulatory Visit: Payer: Medicare Other

## 2021-07-31 DIAGNOSIS — I10 Essential (primary) hypertension: Secondary | ICD-10-CM

## 2021-07-31 NOTE — Progress Notes (Signed)
Patient here for blood pressure check, at last visit it was high.  Today's bp is 122/86.  Pt states last visit it was high due to a migraine medication.

## 2021-08-01 LAB — MICROALBUMIN / CREATININE URINE RATIO
Creatinine, Urine: 295 mg/dL (ref 20–320)
Microalb Creat Ratio: 12 mcg/mg creat (ref <30)
Microalb, Ur: 3.5 mg/dL

## 2021-08-06 ENCOUNTER — Ambulatory Visit: Payer: Medicare Other

## 2021-08-06 ENCOUNTER — Ambulatory Visit (INDEPENDENT_AMBULATORY_CARE_PROVIDER_SITE_OTHER): Payer: Medicare Other

## 2021-08-06 DIAGNOSIS — Z0000 Encounter for general adult medical examination without abnormal findings: Secondary | ICD-10-CM

## 2021-08-06 NOTE — Patient Instructions (Signed)
Juan Hodges , Thank you for taking time to come for your Medicare Wellness Visit. I appreciate your ongoing commitment to your health goals. Please review the following plan we discussed and let me know if I can assist you in the future.   Screening recommendations/referrals: Colonoscopy: please discuss screening recommendations at your next apt  Recommended yearly ophthalmology/optometry visit for glaucoma screening and checkup Recommended yearly dental visit for hygiene and checkup  Vaccinations: Influenza vaccine: declined  Pneumococcal vaccine: done 06/19/21 Tdap vaccine: due Shingles vaccine: due age 46   Covid-19: done 02/08/20 & 02/29/20  Advanced directives: Please bring a copy of your health care power of attorney and living will to the office at your convenience.   Conditions/risks identified: Recommend increasing physical activity   Next appointment: Follow up in one year for your annual wellness visit   Preventive Care 40-64 Years, Male Preventive care refers to lifestyle choices and visits with your health care provider that can promote health and wellness. What does preventive care include? A yearly physical exam. This is also called an annual well check. Dental exams once or twice a year. Routine eye exams. Ask your health care provider how often you should have your eyes checked. Personal lifestyle choices, including: Daily care of your teeth and gums. Regular physical activity. Eating a healthy diet. Avoiding tobacco and drug use. Limiting alcohol use. Practicing safe sex. Taking low-dose aspirin every day starting at age 46. What happens during an annual well check? The services and screenings done by your health care provider during your annual well check will depend on your age, overall health, lifestyle risk factors, and family history of disease. Counseling  Your health care provider may ask you questions about your: Alcohol use. Tobacco use. Drug  use. Emotional well-being. Home and relationship well-being. Sexual activity. Eating habits. Work and work Astron[employer]. Screening  You may have the following tests or measurements: Height, weight, and BMI. Blood pressure. Lipid and cholesterol levels. These may be checked every 5 years, or more frequently if you are over 101 years old. Skin check. Lung cancer screening. You may have this screening every year starting at age 46 if you have a 30-pack-year history of smoking and currently smoke or have quit within the past 15 years. Fecal occult blood test (FOBT) of the stool. You may have this test every year starting at age 46. Flexible sigmoidoscopy or colonoscopy. You may have a sigmoidoscopy every 5 years or a colonoscopy every 10 years starting at age 34. Prostate cancer screening. Recommendations will vary depending on your family history and other risks. Hepatitis C blood test. Hepatitis B blood test. Sexually transmitted disease (STD) testing. Diabetes screening. This is done by checking your blood sugar (glucose) after you have not eaten for a while (fasting). You may have this done every 1-3 years. Discuss your test results, treatment options, and if necessary, the need for more tests with your health care provider. Vaccines  Your health care provider may recommend certain vaccines, such as: Influenza vaccine. This is recommended every year. Tetanus, diphtheria, and acellular pertussis (Tdap, Td) vaccine. You may need a Td booster every 10 years. Zoster vaccine. You may need this after age 46. Pneumococcal 13-valent conjugate (PCV13) vaccine. You may need this if you have certain conditions and have not been vaccinated. Pneumococcal polysaccharide (PPSV23) vaccine. You may need one or two doses if you smoke cigarettes or if you have certain conditions. Talk to your health care provider about which screenings and  vaccines you need and how often you need them. This information is  not intended to replace advice given to you by your health care provider. Make sure you discuss any questions you have with your health care provider. Document Released: 09/21/2015 Document Revised: 05/14/2016 Document Reviewed: 06/26/2015 Elsevier Interactive Patient Education  2017 [place] Prevention in the Home Falls can cause injuries. They can happen to people of all ages. There are many things you can do to make your home safe and to help prevent falls. What can I do on the outside of my home? Regularly fix the edges of walkways and driveways and fix any cracks. Remove anything that might make you trip as you walk through a door, such as a raised step or threshold. Trim any bushes or trees on the path to your home. Use bright outdoor lighting. Clear any walking paths of anything that might make someone trip, such as rocks or tools. Regularly check to see if handrails are loose or broken. Make sure that both sides of any steps have handrails. Any raised decks and porches should have guardrails on the edges. Have any leaves, snow, or ice cleared regularly. Use sand or salt on walking paths during winter. Clean up any spills in your garage right away. This includes oil or grease spills. What can I do in the bathroom? Use night lights. Install grab bars by the toilet and in the tub and shower. Do not use towel bars as grab bars. Use non-skid mats or decals in the tub or shower. If you need to sit down in the shower, use a plastic, non-slip stool. Keep the floor dry. Clean up any water that spills on the floor as soon as it happens. Remove soap buildup in the tub or shower regularly. Attach bath mats securely with double-sided non-slip rug tape. Do not have throw rugs and other things on the floor that can make you trip. What can I do in the bedroom? Use night lights. Make sure that you have a light by your bed that is easy to reach. Do not use any sheets or blankets that  are too big for your bed. They should not hang down onto the floor. Have a firm chair that has side arms. You can use this for support while you get dressed. Do not have throw rugs and other things on the floor that can make you trip. What can I do in the kitchen? Clean up any spills right away. Avoid walking on wet floors. Keep items that you use a lot in easy-to-reach places. If you need to reach something above you, use a strong step stool that has a grab bar. Keep electrical cords out of the way. Do not use floor polish or wax that makes floors slippery. If you must use wax, use non-skid floor wax. Do not have throw rugs and other things on the floor that can make you trip. What can I do with my stairs? Do not leave any items on the stairs. Make sure that there are handrails on both sides of the stairs and use them. Fix handrails that are broken or loose. Make sure that handrails are as long as the stairways. Check any carpeting to make sure that it is firmly attached to the stairs. Fix any carpet that is loose or worn. Avoid having throw rugs at the top or bottom of the stairs. If you do have throw rugs, attach them to the floor with carpet tape. Make  sure that you have a light switch at the top of the stairs and the bottom of the stairs. If you do not have them, ask someone to add them for you. What else can I do to help prevent falls? Wear shoes that: Do not have high heels. Have rubber bottoms. Are comfortable and fit you well. Are closed at the toe. Do not wear sandals. If you use a stepladder: Make sure that it is fully opened. Do not climb a closed stepladder. Make sure that both sides of the stepladder are locked into place. Ask someone to hold it for you, if possible. Clearly mark and make sure that you can see: Any grab bars or handrails. First and last steps. Where the edge of each step is. Use tools that help you move around (mobility aids) if they are needed. These  include: Canes. Walkers. Scooters. Crutches. Turn on the lights when you go into a dark area. Replace any light bulbs as soon as they burn out. Set up your furniture so you have a clear path. Avoid moving your furniture around. If any of your floors are uneven, fix them. If there are any pets around you, be aware of where they are. Review your medicines with your doctor. Some medicines can make you feel dizzy. This can increase your chance of falling. Ask your doctor what other things that you can do to help prevent falls. This information is not intended to replace advice given to you by your health care provider. Make sure you discuss any questions you have with your health care provider. Document Released: 06/21/2009 Document Revised: 01/31/2016 Document Reviewed: 09/29/2014 Elsevier Interactive Patient Education  2017 [employer].

## 2021-08-06 NOTE — Progress Notes (Signed)
Subjective:   Juan Hodges is a 45 y.o. male who presents for Medicare Annual/Subsequent preventive examination.  Virtual Visit via Telephone Note  I connected with  Juan Hodges on 08/06/21 at 10:00 AM EST by telephone and verified that I am speaking with the correct person using two identifiers.  Location: Patient: home  Provider: [facility] Persons participating in the virtual visit: patient/Nurse Health Advisor   I discussed the limitations, risks, security and privacy concerns of performing an evaluation and management service by telephone and the availability of in person appointments. The patient expressed understanding and agreed to proceed.  Interactive audio and video telecommunications were attempted between this nurse and patient, however failed, due to patient having technical difficulties OR patient did not have access to video capability.  We continued and completed visit with audio only.  Some vital signs may be absent or patient reported.   Reather Littler, LPN   Review of Systems     Cardiac Risk Factors include: male gender;hypertension;dyslipidemia     Objective:    There were no vitals filed for this visit. There is no height or weight on file to calculate BMI.  Advanced Directives 08/06/2021 05/09/2021 07/31/2020 05/06/2019 04/30/2018 04/27/2017 04/27/2017  Does Patient Have a Medical Advance Directive? Yes No No No No No No  Type of Advance Directive Healthcare Power of Attorney - - - - - -  Copy of Healthcare Power of Attorney in Chart? No - copy requested - - - - - -  Would patient like information on creating a medical advance directive? - No - Patient declined No - Patient declined No - Patient declined Yes (MAU/Ambulatory/Procedural Areas - Information given) - No - Patient declined    Current Medications (verified) Outpatient Encounter Medications as of 08/06/2021  Medication Sig   amLODipine (NORVASC) 2.5 MG tablet Take 1 tablet (2.5 mg total) by mouth  daily.   cephALEXin (KEFLEX) 500 MG capsule Take 1,000 mg by mouth 3 (three) times daily as needed. PRN symptoms   ezetimibe (ZETIA) 10 MG tablet Take 1 tablet (10 mg total) by mouth daily.   lisinopril-hydrochlorothiazide (ZESTORETIC) 20-25 MG tablet Take 1 tablet by mouth daily.   metoprolol succinate (TOPROL-XL) 50 MG 24 hr tablet Take with or immediately following a meal.   Vitamin D, Ergocalciferol, (DRISDOL) 1.25 MG (50000 UNIT) CAPS capsule Take 1 capsule (50,000 Units total) by mouth every 7 (seven) days.   SUMAtriptan (IMITREX) 100 MG tablet Take 1 tablet (100 mg total) by mouth every 2 (two) hours as needed for migraine. May repeat in 2 hours if headache persists or recurs. (Patient not taking: Reported on 08/06/2021)   Ubrogepant (UBRELVY) 100 MG TABS Take 1 tablet by mouth every other day as needed. (Patient not taking: Reported on 08/06/2021)   [DISCONTINUED] Cholecalciferol 25 MCG (1000 UT) capsule Take by mouth.   No facility-administered encounter medications on file as of 08/06/2021.    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, reportedly non-functional for years   Family History  Problem Relation Age of Onset   Hyperlipidemia Mother    Hypertension Mother    Kidney cancer Maternal Grandmother    Kidney disease Neg Hx    Prostate cancer Neg Hx    Social History   Socioeconomic History   Marital status: Single    Spouse name: Not on file   Number of children: 0   Years of education: Not on file   Highest education level: 12th grade  Occupational History    Employer: [employer]  Tobacco Use    Smoking status: Never   Smokeless tobacco: Never   Tobacco comments:    smoking cessation materials not required  Vaping Use   Vaping Use: Never used  Substance and Sexual Activity   Alcohol use: No    Alcohol/week: 0.0 standard drinks   Drug use: No   Sexual activity: Not Currently  Other Topics Concern   Not on file  Social History Narrative   Does not drive   Social Determinants of Health   Financial Resource Strain: Low Risk    Difficulty of Paying Living Expenses: Not hard at all  Food Insecurity: No Food Insecurity   Worried About Programme researcher, broadcasting/film/video in the Last Year: Never true   Ran Out of Food in the Last Year: Never true  Transportation Needs: No Transportation Needs   Lack of Transportation (Medical): No   Lack of Transportation (Non-Medical): No  Physical Activity: Inactive   Days of Exercise per Week: 0 days   Minutes of Exercise per Session: 0 min  Stress: No Stress Concern Present   Feeling of Stress : Not at all  Social Connections: Socially Isolated   Frequency of Communication with Friends and Family: More than three times a week   Frequency of Social Gatherings with Friends and Family: More than three times a week   Attends Religious Services: Never   Database administrator or Organizations: No   Attends Engineer, structural: Never   Marital Status: Never married    Tobacco Counseling Counseling given: Not Answered Tobacco comments: smoking cessation materials not required   Clinical Intake:  Pre-visit preparation completed: Yes  Pain : No/denies pain     Nutritional Risks: None Diabetes: No  How often do you need to have someone help you when you read instructions, pamphlets, or other written materials from your doctor or pharmacy?: 1 - Never    Interpreter Needed?: No  Information entered by :: Reather Littler LPN   Activities of Daily Living In your present state of health, do you have any difficulty performing the following  activities: 08/06/2021 06/19/2021  Hearing? N N  Vision? N -  Difficulty concentrating or making decisions? N N  Walking or climbing stairs? N Y  Dressing or bathing? N N  Doing errands, shopping? Y Y  Comment does not drive Chief Operating Officer and eating ? N -  Using the Toilet? N -  In the past six months, have you accidently leaked urine? N -  Do you have problems with loss of bowel control? N -  Managing your Medications? N -  Managing your Finances? N -  Housekeeping or managing your Housekeeping? N -  Some recent data might be hidden    Patient Care Team: Alba Cory, MD as PCP - General (Family Medicine) Rene Paci, MD as Consulting Physician (Urology)  Indicate any recent Medical Services you may have received from other than [facility] providers in the past year (  date may be approximate).     Assessment:   This is a routine wellness examination for [place].  Hearing/Vision screen Hearing Screening - Comments:: Pt denies hearing difficulty Vision Screening - Comments:: Past due for eye exam; declines referral at this time.   Dietary issues and exercise activities discussed: Current Exercise Habits: The patient does not participate in regular exercise at present, Exercise limited by: neurologic condition(s)   Goals Addressed             This Visit's Progress    DIET - INCREASE WATER INTAKE   On track    Recommend to drink at least 6-8 8oz glasses of water per day.     Eat more fruits and vegetables   On track    Recommend to increase the amount of fruits and vegetables in daily diet to at least 1 serving of each.        Depression Screen PHQ 2/9 Scores 08/06/2021 06/19/2021 12/17/2020 07/31/2020 06/15/2020 05/06/2019 03/18/2019  PHQ - 2 Score 0 0 0 0 0 0 0  PHQ- 9 Score - - - - - - 0    Fall Risk Fall Risk  08/06/2021 06/19/2021 12/17/2020 07/31/2020 06/15/2020  Falls in the past year? 0 0 0 0 0  Number falls in past yr: 0 0 0 0 0  Injury with  Fall? 0 0 0 0 0  Risk for fall due to : No Fall Risks No Fall Risks - No Fall Risks -  Risk for fall due to: Comment - - - - -  Follow up Falls prevention discussed Falls prevention discussed - Falls prevention discussed -    FALL RISK PREVENTION PERTAINING TO THE HOME:  Any stairs in or around the home? Yes  If so, are there any without handrails? No  Home free of loose throw rugs in walkways, pet beds, electrical cords, etc? Yes  Adequate lighting in your home to reduce risk of falls? Yes   ASSISTIVE DEVICES UTILIZED TO PREVENT FALLS:  Life alert? No  Use of a cane, walker or w/c? No  Grab bars in the bathroom? Yes  Shower chair or bench in shower? No  Elevated toilet seat or a handicapped toilet? No   TIMED UP AND GO:  Was the test performed? No . Telephonic visit   Cognitive Function: Normal cognitive status assessed by direct observation by this Nurse Health Advisor. No abnormalities found.       6CIT Screen 05/06/2019 04/30/2018  What Year? 0 points 0 points  What month? 0 points 0 points  What time? 0 points 0 points  Count back from 20 0 points 0 points  Months in reverse 0 points 2 points  Repeat phrase 2 points 2 points  Total Score 2 4    Immunizations Immunization History  Administered Date(s) Administered   PFIZER(Purple Top)SARS-COV-2 Vaccination 02/08/2020, 02/29/2020   Pneumococcal Polysaccharide-23 06/19/2021    TDAP status: Due, Education has been provided regarding the importance of this vaccine. Advised may receive this vaccine at local pharmacy or Health Dept. Aware to provide a copy of the vaccination record if obtained from local pharmacy or Health Dept. Verbalized acceptance and understanding.  Flu Vaccine status: Declined, Education has been provided regarding the importance of this vaccine but patient still declined. Advised may receive this vaccine at local pharmacy or Health Dept. Aware to provide a copy of the vaccination record if obtained  from local pharmacy or Health Dept. Verbalized acceptance and understanding.  Pneumococcal  vaccine status: Up to date  Covid-19 vaccine status: Completed vaccines  Qualifies for Shingles Vaccine? No  due at age 30  Screening Tests Health Maintenance  Topic Date Due   COVID-19 Vaccine (3 - Pfizer risk series) 03/28/2020   COLONOSCOPY (Pts 45-88yrs Insurance coverage will need to be confirmed)  Never done   INFLUENZA VACCINE  12/06/2021 (Originally 04/08/2021)   TETANUS/TDAP  06/19/2022 (Originally 07/30/1994)   Pneumococcal Vaccine 13-46 Years old (2 - PCV) 06/19/2022   Hepatitis C Screening  Completed   HIV Screening  Completed   HPV VACCINES  Aged Out    Health Maintenance  Health Maintenance Due  Topic Date Due   COVID-19 Vaccine (3 - Pfizer risk series) 03/28/2020   COLONOSCOPY (Pts 45-26yrs Insurance coverage will need to be confirmed)  Never done   Colorectal Cancer screening: pt to discuss at next appt.   Lung Cancer Screening: (Low Dose CT Chest recommended if Age 31-80 years, 30 pack-year currently smoking OR have quit w/in 15years.) does not qualify.   Additional Screening:  Hepatitis C Screening: does qualify; Completed 06/15/20  Vision Screening: Recommended annual ophthalmology exams for early detection of glaucoma and other disorders of the eye. Is the patient up to date with their annual eye exam?  No  Who is the provider or what is the name of the office in which the patient attends annual eye exams? Not established If pt is not established with a provider, would they like to be referred to a provider to establish care? No .   Dental Screening: Recommended annual dental exams for proper oral hygiene  Community Resource Referral / Chronic Care Management: CRR required this visit?  No   CCM required this visit?  No      Plan:     I have personally reviewed and noted the following in the patient's chart:   Medical and social history Use of alcohol,  tobacco or illicit drugs  Current medications and supplements including opioid prescriptions. Patient is not currently taking opioid prescriptions. Functional ability and status Nutritional status Physical activity Advanced directives List of other physicians Hospitalizations, surgeries, and ER visits in previous 12 months Vitals Screenings to include cognitive, depression, and falls Referrals and appointments  In addition, I have reviewed and discussed with patient certain preventive protocols, quality metrics, and best practice recommendations. A written personalized care plan for preventive services as well as general preventive health recommendations were provided to patient.     Reather Littler, LPN   [id number]   Nurse Notes: none

## 2021-09-23 ENCOUNTER — Other Ambulatory Visit: Payer: Self-pay | Admitting: Family Medicine

## 2021-09-23 DIAGNOSIS — I10 Essential (primary) hypertension: Secondary | ICD-10-CM

## 2021-09-24 NOTE — Telephone Encounter (Signed)
Pt has an appt on Friday 09/27/21

## 2021-09-27 ENCOUNTER — Ambulatory Visit: Payer: Medicare Other | Admitting: Family Medicine

## 2021-10-14 NOTE — Progress Notes (Signed)
Name: Juan Hodges   MRN: [id number]    DOB: 04/23/1976   Date:10/15/2021       Progress Note  Subjective  Chief Complaint  Medication Refill  HPI  HTN: he is now on Lisinopril/hctz and metoprolol, bp last visit was really high and we gave him Norvasc 2.5 mg but he never started it. He is now compliant with medication and bp is at goal today    Vitamin D deficiency: he has been taking rx vitamin D, he would like to continue rx vitamin D  .    Hyperlipidemia: last Lipid panel was very high but he was not taking medication, he is back on Crestor but has noticed muscle stiffness and cramping , he would like to try zetia again before going on PCSK9    Migraine: he is on beta-blocker and was on nortriptyline, episodes are about once a week. He describes it as throbbing, frontal, no phonophobia but has phonophobia. No nausea or vomiting. He states Imitrex spiked bp , but now on Ubrelvy and has been taking medication a few times a month    Neurogenic bladder/recurrent UTI:/spina bifida: seeing Urologist - Dr. Gilford Rile at [facility]  , he takes cephalexin prn only.  He states that symptoms usually consists of strong odor but no problems at this time,  still self cath at least 3 times per day.  Gait is stable , he wears foot braces to assist with his gait . Unchanged   Stress: he has been very stressed due to new supervisor , they made comments about his CP and made his bp spike Summer 22 and had to go to [employer], now they are telling him that after 27 years working for the [employer] he will need to re-apply to his position. He is really upset about it. 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, reportedly non-functional for years    Family History  Problem Relation Age of Onset   Hyperlipidemia Mother    Hypertension Mother    Kidney cancer Maternal Grandmother    Kidney disease Neg Hx    Prostate cancer Neg Hx     Social History   Tobacco Use   Smoking status: Never   Smokeless tobacco: Never   Tobacco comments:    smoking cessation materials not required  Substance Use Topics   Alcohol use: No    Alcohol/week: 0.0 standard drinks     Current Outpatient Medications:    Ubrogepant (UBRELVY) 100 MG TABS, Take 1 tablet by mouth every other day as needed., Disp: 15 tablet, Rfl: 2   Vitamin D, Ergocalciferol, (DRISDOL) 1.25 MG (50000 UNIT) CAPS capsule, Take 1 capsule (50,000 Units total) by mouth every 7 (seven) days., Disp: 12 capsule, Rfl: 1   ezetimibe (ZETIA) 10 MG tablet, Take 1 tablet (10 mg total) by mouth  daily., Disp: 90 tablet, Rfl: 0   lisinopril-hydrochlorothiazide (ZESTORETIC) 20-25 MG tablet, Take 1 tablet by mouth daily., Disp: 90 tablet, Rfl: 1   metoprolol succinate (TOPROL-XL) 50 MG 24 hr tablet, Take with or immediately following a meal., Disp: 90 tablet, Rfl: 1  Allergies  Allergen Reactions   Augmentin [Amoxicillin-Pot Clavulanate]     GI distress    I personally reviewed active problem list, medication list, allergies, family history, social history, health maintenance with the patient/caregiver today.   ROS  Ten systems reviewed and is negative except as mentioned in HPI    Objective  Vitals:   10/15/21 1114  BP: 136/86  Pulse: 92  Resp: 16  SpO2: 97%  Weight: 166 lb (75.3 kg)  Height: 5\' 4"  (1.626 m)    Body mass index is 28.49  kg/m.  Physical Exam  Constitutional: Patient appears well-developed and well-nourished. Overweight.  No distress.  HEENT: head atraumatic, normocephalic, pupils equal and reactive to light, neck supple Cardiovascular: Normal rate, regular rhythm and normal heart sounds.  No murmur heard. No BLE edema. Pulmonary/Chest: Effort normal and breath sounds normal. No respiratory distress. Abdominal: Soft.  There is no tenderness. Psychiatric: Patient has a normal mood and affect. behavior is normal. Judgment and thought content normal.  Muscular skeletal: braces on legs, leg atrophy due to CP   Recent Results (from the past 2160 hour(s))  Urine Microalbumin w/creat. ratio     Status: None   Collection Time: 07/31/21 11:46 AM  Result Value Ref Range   Creatinine, Urine 295 20 - 320 mg/dL   Microalb, Ur 3.5 mg/dL    Comment: Reference Range Not established    Microalb Creat Ratio 12 <30 mcg/mg creat    Comment: . The ADA defines abnormalities in albumin excretion as follows: Marland Kitchen Albuminuria Category        Result (mcg/mg creatinine) . Normal to Mildly increased   <30 Moderately increased         30-299  Severely increased           > OR = 300 . The ADA recommends that at least two of three specimens collected within a 3-6 month period be abnormal before considering a patient to be within a diagnostic category.     PHQ2/9: Depression screen [facility] 2/9 10/15/2021 08/06/2021 06/19/2021 12/17/2020 07/31/2020  Decreased Interest 0 0 0 0 0  Down, Depressed, Hopeless 0 0 0 0 0  PHQ - 2 Score 0 0 0 0 0  Altered sleeping 0 - - - -  Tired, decreased energy 0 - - - -  Change in appetite 0 - - - -  Feeling bad or failure about yourself  0 - - - -  Trouble concentrating 0 - - - -  Moving slowly or fidgety/restless 0 - - - -  Suicidal thoughts 0 - - - -  PHQ-9 Score 0 - - - -  Difficult doing work/chores - - - - -    phq 9 is negative   Fall Risk: Fall Risk  10/15/2021 08/06/2021  06/19/2021 12/17/2020 07/31/2020  Falls in the past year? 0 0 0 0 0  Number falls in past yr: 0 0 0 0 0  Injury with Fall? 0 0 0 0 0  Risk for fall due to : No Fall Risks No Fall Risks No Fall Risks - No Fall Risks  Risk for fall due to: Comment - - - - -  Follow up Falls prevention discussed Falls  prevention discussed Falls prevention discussed - Falls prevention discussed      Functional Status Survey: Is the patient deaf or have difficulty hearing?: No Does the patient have difficulty seeing, even when wearing glasses/contacts?: No Does the patient have difficulty concentrating, remembering, or making decisions?: No Does the patient have difficulty walking or climbing stairs?: Yes Does the patient have difficulty dressing or bathing?: No Does the patient have difficulty doing errands alone such as visiting a doctor's office or shopping?: No    Assessment & Plan  1. Hypertension, benign  - lisinopril-hydrochlorothiazide (ZESTORETIC) 20-25 MG tablet; Take 1 tablet by mouth daily.  Dispense: 90 tablet; Refill: 1 - metoprolol succinate (TOPROL-XL) 50 MG 24 hr tablet; Take with or immediately following a meal.  Dispense: 90 tablet; Refill: 1 - Lipid panel - COMPLETE METABOLIC PANEL WITH GFR  2. Migraine without aura and without status migrainosus, not intractable  - metoprolol succinate (TOPROL-XL) 50 MG 24 hr tablet; Take with or immediately following a meal.  Dispense: 90 tablet; Refill: 1  3. Pure hypercholesterolemia  - ezetimibe (ZETIA) 10 MG tablet; Take 1 tablet (10 mg total) by mouth daily.  Dispense: 90 tablet; Refill: 0  4. Familial hyperlipidemia  - ezetimibe (ZETIA) 10 MG tablet; Take 1 tablet (10 mg total) by mouth daily.  Dispense: 90 tablet; Refill: 0  5. Vitamin D deficiency   6. Neurogenic bladder   7. Self-catheterizes urinary bladder   8. Spina bifida aperta of lumbar spine ([place])   9. Gait disturbance   10. Hyperglycemia  - Hemoglobin  A1c  11. History of brain shunt   12. Elevated red blood cell count  - CBC with Differential/Platelet   13. Work stress  Having problems with her supervisor, very stressful

## 2021-10-15 ENCOUNTER — Ambulatory Visit (INDEPENDENT_AMBULATORY_CARE_PROVIDER_SITE_OTHER): Payer: Medicare Other | Admitting: Family Medicine

## 2021-10-15 ENCOUNTER — Encounter: Payer: Self-pay | Admitting: Family Medicine

## 2021-10-15 VITALS — BP 136/86 | HR 92 | Resp 16 | Ht 64.0 in | Wt 166.0 lb

## 2021-10-15 DIAGNOSIS — Z789 Other specified health status: Secondary | ICD-10-CM

## 2021-10-15 DIAGNOSIS — Z982 Presence of cerebrospinal fluid drainage device: Secondary | ICD-10-CM | POA: Diagnosis not present

## 2021-10-15 DIAGNOSIS — G43009 Migraine without aura, not intractable, without status migrainosus: Secondary | ICD-10-CM

## 2021-10-15 DIAGNOSIS — R718 Other abnormality of red blood cells: Secondary | ICD-10-CM | POA: Diagnosis not present

## 2021-10-15 DIAGNOSIS — R739 Hyperglycemia, unspecified: Secondary | ICD-10-CM

## 2021-10-15 DIAGNOSIS — Q057 Lumbar spina bifida without hydrocephalus: Secondary | ICD-10-CM | POA: Diagnosis not present

## 2021-10-15 DIAGNOSIS — R269 Unspecified abnormalities of gait and mobility: Secondary | ICD-10-CM

## 2021-10-15 DIAGNOSIS — E7849 Other hyperlipidemia: Secondary | ICD-10-CM | POA: Diagnosis not present

## 2021-10-15 DIAGNOSIS — I10 Essential (primary) hypertension: Secondary | ICD-10-CM

## 2021-10-15 DIAGNOSIS — Z566 Other physical and mental strain related to work: Secondary | ICD-10-CM

## 2021-10-15 DIAGNOSIS — E559 Vitamin D deficiency, unspecified: Secondary | ICD-10-CM

## 2021-10-15 DIAGNOSIS — N319 Neuromuscular dysfunction of bladder, unspecified: Secondary | ICD-10-CM | POA: Diagnosis not present

## 2021-10-15 DIAGNOSIS — E7800 Pure hypercholesterolemia, unspecified: Secondary | ICD-10-CM

## 2021-10-15 MED ORDER — METOPROLOL SUCCINATE ER 50 MG PO TB24: ORAL_TABLET | ORAL | 1 refills | Status: DC

## 2021-10-15 MED ORDER — EZETIMIBE 10 MG PO TABS: 10.0000 mg | ORAL_TABLET | Freq: Every day | ORAL | 0 refills | Status: DC

## 2021-10-15 MED ORDER — LISINOPRIL-HYDROCHLOROTHIAZIDE 20-25 MG PO TABS: 1.0000 | ORAL_TABLET | Freq: Every day | ORAL | 1 refills | Status: DC

## 2021-10-17 ENCOUNTER — Other Ambulatory Visit: Payer: Self-pay | Admitting: Family Medicine

## 2021-10-17 DIAGNOSIS — G43009 Migraine without aura, not intractable, without status migrainosus: Secondary | ICD-10-CM

## 2021-10-17 DIAGNOSIS — I10 Essential (primary) hypertension: Secondary | ICD-10-CM

## 2021-10-17 MED ORDER — METOPROLOL SUCCINATE ER 50 MG PO TB24: 50.0000 mg | ORAL_TABLET | Freq: Every day | ORAL | 1 refills | Status: DC

## 2021-11-01 ENCOUNTER — Other Ambulatory Visit: Payer: Self-pay | Admitting: Family Medicine

## 2021-11-01 DIAGNOSIS — I10 Essential (primary) hypertension: Secondary | ICD-10-CM

## 2021-12-17 ENCOUNTER — Other Ambulatory Visit: Payer: Self-pay | Admitting: Family Medicine

## 2021-12-17 DIAGNOSIS — I10 Essential (primary) hypertension: Secondary | ICD-10-CM

## 2022-01-02 DIAGNOSIS — R3914 Feeling of incomplete bladder emptying: Secondary | ICD-10-CM | POA: Diagnosis not present

## 2022-01-02 DIAGNOSIS — Q059 Spina bifida, unspecified: Secondary | ICD-10-CM | POA: Diagnosis not present

## 2022-01-02 DIAGNOSIS — N3021 Other chronic cystitis with hematuria: Secondary | ICD-10-CM | POA: Diagnosis not present

## 2022-01-02 DIAGNOSIS — N3020 Other chronic cystitis without hematuria: Secondary | ICD-10-CM | POA: Diagnosis not present

## 2022-01-06 ENCOUNTER — Telehealth: Payer: Self-pay

## 2022-01-06 NOTE — Telephone Encounter (Signed)
Copied from [place] [id number]. Topic: General - Other ?>> Jan 06, 2022 12:58 PM Holley Dexter N wrote: ?Reason for CRM: Pt called in stating he was seen a few months ago and was given a cholestrol medication, but was recenlty put on a different cholesterol medication, but the pharmacy told him to contact PCP since he was already on one. Pt could not remember the name of the medication.please advise. ?

## 2022-01-06 NOTE — Telephone Encounter (Signed)
The only cholesterol medication on file is Zetia. Spoke to patient and clarified there shouldn't be any other cholesterol medication. He will call pharmacy back and clarify with them. ?

## 2022-01-07 NOTE — Telephone Encounter (Signed)
Called Juan Hodges and made aware to still come to visit and per Dr. Vivianne Master it's okay if he doesn't get his labs done just yet due to the Zetia ?

## 2022-01-07 NOTE — Telephone Encounter (Signed)
Patient picked up ezetimibe (ZETIA) 10 MG tablet yesterday and will start. Patient would like to know since he is starting medication now would PCP like for him to [facility] lab and office visit scheduled for 01/13/2022, please advise  ?

## 2022-01-10 NOTE — Progress Notes (Signed)
Name: Juan Hodges   MRN: [id number]    DOB: September 18, 1975   Date:01/13/2022 ? ?     Progress Note ? ?Subjective ? ?Chief Complaint ? ?Follow Up ? ?HPI ? ?HTN: he is now on Lisinopril/hctz and metoprolol, bp last visit was really high and we gave him Norvasc 2.5 mg but he never started it, he states he had stopped taking lisinopril hctz 20/25 mg for a period of time because he was out of catheter and states hctz makes him go to the bathroom all the time. We will try changing from lisinopril hctz to valsartan hctz 160/12.5 mg   ?  ?Vitamin D deficiency: he has been taking rx vitamin D,, needs to return for labs  ?  ?Hyperlipidemia: he is back on Crestor, it had caused muscle pain so we tried zetia but he states it caused diarrhea so he is back on Crestor for the past few days and is tolerating it well.  ?  ?Migraine:  He describes migraine episodes  as throbbing, frontal, no phonophobia but has phonophobia. No nausea or vomiting. He states Imitrex spiked bp, he is now taking Ubrelvy prn and is doing well, episodes are about once a month  ?  ?Neurogenic bladder/recurrent UTI:/spina bifida: seeing Urologist - Dr. Liliane Shi at [facility]  , he takes cephalexin prn only.  He states that symptoms usually consists of strong odor but no problems at this time,  still self cath at least 3 times per day.  Gait is stable , he wears foot braces to assist with his gait . He had a recent visit and is getting treated for UTI now  ? ?Stress: he has been very stressed due to new supervisor , they made comments about his CP and made his bp spike Summer 22 and had to go to [facility], now they are telling him that after 28 years working for the [employer]. He was told to re-apply to his position after all those years. He is now trying to transition to another position ? ?Patient Active Problem List  ? Diagnosis Date Noted  ? Familial hyperlipidemia 01/13/2022  ? Self-catheterizes urinary bladder 04/27/2017  ? H/O fracture of hip  01/01/2016  ? Vitamin D deficiency 01/01/2016  ? 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) 01/01/2016  ? Hypertension, benign 01/01/2016  ? Neurogenic bladder 12/19/2009  ? Migraine without aura and without status migrainosus, not intractable 11/09/2009  ? Chronic urinary tract infection 08/27/2007  ? ? ?Past Surgical History:  ?Procedure Laterality Date  ? FEMUR SURGERY    ? broken bone  ? HIP SURGERY    ? SPINAL FIXATION SURGERY    ? VENTRICULOPERITONEAL SHUNT    ? as a child, reportedly non-functional for years  ? ? ?Family History  ?Problem Relation Age of Onset  ? Hyperlipidemia Mother   ? Hypertension Mother   ? Kidney cancer Maternal Grandmother   ? Kidney disease Neg Hx   ? Prostate cancer Neg Hx   ? ? ?Social History  ? ?Tobacco Use  ? Smoking status: Never  ? Smokeless tobacco: Never  ? Tobacco comments:  ?  smoking cessation materials not required  ?Substance Use Topics  ? Alcohol use: No  ?  Alcohol/week: 0.0 standard drinks  ? ? ? ?Current Outpatient Medications:  ?  cephALEXin (KEFLEX) 500 MG capsule, Take 500 mg by mouth 4 (four) times daily., Disp: , Rfl:  ?  rosuvastatin (CRESTOR) 10 MG tablet, Take 1 tablet (10  mg total) by mouth daily., Disp: 90 tablet, Rfl: 0 ?  Ubrogepant (UBRELVY) 100 MG TABS, Take 1 tablet by mouth every other day as needed., Disp: 15 tablet, Rfl: 2 ?  valsartan-hydrochlorothiazide (DIOVAN-HCT) 160-12.5 MG tablet, Take 1 tablet by mouth daily. In place of lisinopril hctz, Disp: 90 tablet, Rfl: 1 ?  metoprolol succinate (TOPROL-XL) 50 MG 24 hr tablet, Take 1 tablet (50 mg total) by mouth daily. Take with or immediately following a meal., Disp: 90 tablet, Rfl: 0 ?  Vitamin D, Ergocalciferol, (DRISDOL) 1.25 MG (50000 UNIT) CAPS capsule, Take 1 capsule (50,000 Units total) by mouth every 7 (seven) days., Disp: 12 capsule, Rfl: 1 ? ?Allergies  ?Allergen Reactions  ? Augmentin [Amoxicillin-Pot Clavulanate]   ?  GI distress  ? ? ?I personally reviewed active problem list, medication  list, allergies, family history, social history, health maintenance with the patient/caregiver today. ? ? ?ROS ? ?Constitutional: Negative for fever or weight change.  ?Respiratory: Negative for cough and shortness of breath.   ?Cardiovascular: Negative for chest pain or palpitations.  ?Gastrointestinal: Negative for abdominal pain, no bowel changes.  ?Musculoskeletal: Positive for gait problem or joint swelling.  ?Skin: Negative for rash.  ?Neurological: Negative for dizziness or headache.  ?No other specific complaints in a complete review of systems (except as listed in HPI above).  ? ?Objective ? ?Vitals:  ? 01/13/22 1134  ?BP: 138/86  ?Pulse: 94  ?Resp: 16  ?SpO2: 98%  ?Weight: 165 lb (74.8 kg)  ?Height: 5\' 4"  (1.626 m)  ? ? ?Body mass index is 28.32 kg/m?. ? ?Physical Exam ? ?Constitutional: Patient appears well-developed and well-nourished. No distress.  ?HEENT: head atraumatic, normocephalic, pupils equal and reactive to light, neck supple ?Cardiovascular: Normal rate, regular rhythm and normal heart sounds.  No murmur heard. No BLE edema. ?Pulmonary/Chest: Effort normal and breath sounds normal. No respiratory distress. ?Abdominal: Soft.  There is no tenderness. ?Muscular skeletal: wears leg braces, slow gait due to spinal bifida ?Psychiatric: Patient has a normal mood and affect. behavior is normal. Judgment and thought content normal. ? ? ?PHQ2/9: ? ?  01/13/2022  ? 11:34 AM 10/15/2021  ? 11:14 AM 08/06/2021  ? 10:15 AM 06/19/2021  ? 10:57 AM 12/17/2020  ? 10:59 AM  ?Depression screen PHQ 2/9  ?Decreased Interest 0 0 0 0 0  ?Down, Depressed, Hopeless 0 0 0 0 0  ?PHQ - 2 Score 0 0 0 0 0  ?Altered sleeping 0 0     ?Tired, decreased energy 0 0     ?Change in appetite 0 0     ?Feeling bad or failure about yourself  0 0     ?Trouble concentrating 0 0     ?Moving slowly or fidgety/restless 0 0     ?Suicidal thoughts 0 0     ?PHQ-9 Score 0 0     ?  ?phq 9 is negative ? ? ?Fall Risk: ? ?  01/13/2022  ? 11:33 AM 10/15/2021   ? 11:13 AM 08/06/2021  ? 10:17 AM 06/19/2021  ? 10:56 AM 12/17/2020  ? 10:59 AM  ?Fall Risk   ?Falls in the past year? 0 0 0 0 0  ?Number falls in past yr: 0 0 0 0 0  ?Injury with Fall? 0 0 0 0 0  ?Risk for fall due to : No Fall Risks No Fall Risks No Fall Risks No Fall Risks   ?Follow up Falls prevention discussed Falls prevention discussed Falls prevention discussed  Falls prevention discussed   ? ? ? ? ?Functional Status Survey: ?Is the patient deaf or have difficulty hearing?: No ?Does the patient have difficulty seeing, even when wearing glasses/contacts?: No ?Does the patient have difficulty concentrating, remembering, or making decisions?: No ?Does the patient have difficulty walking or climbing stairs?: Yes ?Does the patient have difficulty dressing or bathing?: No ?Does the patient have difficulty doing errands alone such as visiting a doctor's office or shopping?: Yes ? ? ? ?Assessment & Plan ? ?Problem List Items Addressed This Visit   ? ? Migraine without aura and without status migrainosus, not intractable  ?  Doing better ?Taking Metoprolol for migraine prevention and Ubrelvy prn  ? ?  ?  ? Relevant Medications  ? metoprolol succinate (TOPROL-XL) 50 MG 24 hr tablet  ? rosuvastatin (CRESTOR) 10 MG tablet  ? valsartan-hydrochlorothiazide (DIOVAN-HCT) 160-12.5 MG tablet  ? Neurogenic bladder  ?  Being treated by Urologist for UTI  ? ?  ?  ? 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)  ?  Stable, wears braces on both ankles  ? ?  ?  ? Hypertension, benign  ?  Stop lisinopril hctz 20/25 mg due to urinary frequency and start valsartan hctz 160/12.5 mg and monitor  ? ?  ?  ? Relevant Medications  ? metoprolol succinate (TOPROL-XL) 50 MG 24 hr tablet  ? rosuvastatin (CRESTOR) 10 MG tablet  ? valsartan-hydrochlorothiazide (DIOVAN-HCT) 160-12.5 MG tablet  ? Familial hyperlipidemia - Primary  ?  He is back on Crestor ?zetia caused diarrhea ?He will return for labs in one month and if still high consider PCSK-9  ? ?   ?  ? Relevant Medications  ? metoprolol succinate (TOPROL-XL) 50 MG 24 hr tablet  ? rosuvastatin (CRESTOR) 10 MG tablet  ? valsartan-hydrochlorothiazide (DIOVAN-HCT) 160-12.5 MG tablet  ? Self-catheterizes

## 2022-01-13 ENCOUNTER — Encounter: Payer: Self-pay | Admitting: Family Medicine

## 2022-01-13 ENCOUNTER — Ambulatory Visit (INDEPENDENT_AMBULATORY_CARE_PROVIDER_SITE_OTHER): Payer: Medicare Other | Admitting: Family Medicine

## 2022-01-13 VITALS — BP 138/86 | HR 94 | Resp 16 | Ht 64.0 in | Wt 165.0 lb

## 2022-01-13 DIAGNOSIS — E7849 Other hyperlipidemia: Secondary | ICD-10-CM | POA: Diagnosis not present

## 2022-01-13 DIAGNOSIS — Z789 Other specified health status: Secondary | ICD-10-CM

## 2022-01-13 DIAGNOSIS — I10 Essential (primary) hypertension: Secondary | ICD-10-CM | POA: Diagnosis not present

## 2022-01-13 DIAGNOSIS — Q057 Lumbar spina bifida without hydrocephalus: Secondary | ICD-10-CM

## 2022-01-13 DIAGNOSIS — N319 Neuromuscular dysfunction of bladder, unspecified: Secondary | ICD-10-CM | POA: Diagnosis not present

## 2022-01-13 DIAGNOSIS — G43009 Migraine without aura, not intractable, without status migrainosus: Secondary | ICD-10-CM | POA: Diagnosis not present

## 2022-01-13 DIAGNOSIS — E559 Vitamin D deficiency, unspecified: Secondary | ICD-10-CM | POA: Diagnosis not present

## 2022-01-13 MED ORDER — VITAMIN D (ERGOCALCIFEROL) 1.25 MG (50000 UNIT) PO CAPS: 50000.0000 [IU] | ORAL_CAPSULE | ORAL | 1 refills | Status: DC

## 2022-01-13 MED ORDER — ROSUVASTATIN CALCIUM 10 MG PO TABS: 10.0000 mg | ORAL_TABLET | Freq: Every day | ORAL | 0 refills | Status: DC

## 2022-01-13 MED ORDER — VALSARTAN-HYDROCHLOROTHIAZIDE 160-12.5 MG PO TABS
1.0000 | ORAL_TABLET | Freq: Every day | ORAL | 1 refills | Status: DC
Start: 2022-01-13 — End: 2022-10-21

## 2022-01-13 MED ORDER — METOPROLOL SUCCINATE ER 50 MG PO TB24: 50.0000 mg | ORAL_TABLET | Freq: Every day | ORAL | 0 refills | Status: DC

## 2022-01-13 NOTE — Assessment & Plan Note (Signed)
Doing better ?Taking Metoprolol for migraine prevention and Ubrelvy prn  ?

## 2022-01-13 NOTE — Assessment & Plan Note (Signed)
Being treated by Urologist for UTI  ?

## 2022-01-13 NOTE — Assessment & Plan Note (Signed)
Stop lisinopril hctz 20/25 mg due to urinary frequency and start valsartan hctz 160/12.5 mg and monitor  ?

## 2022-01-13 NOTE — Assessment & Plan Note (Signed)
Stable, wears braces on both ankles  ?

## 2022-01-13 NOTE — Assessment & Plan Note (Signed)
He is back on Crestor ?zetia caused diarrhea ?He will return for labs in one month and if still high consider PCSK-9  ?

## 2022-04-01 ENCOUNTER — Other Ambulatory Visit: Payer: Self-pay | Admitting: Family Medicine

## 2022-04-01 DIAGNOSIS — E7849 Other hyperlipidemia: Secondary | ICD-10-CM

## 2022-04-15 ENCOUNTER — Encounter: Payer: Self-pay | Admitting: Family Medicine

## 2022-04-15 ENCOUNTER — Telehealth (INDEPENDENT_AMBULATORY_CARE_PROVIDER_SITE_OTHER): Payer: Medicare Other | Admitting: Family Medicine

## 2022-04-15 DIAGNOSIS — K529 Noninfective gastroenteritis and colitis, unspecified: Secondary | ICD-10-CM | POA: Diagnosis not present

## 2022-04-15 DIAGNOSIS — G43009 Migraine without aura, not intractable, without status migrainosus: Secondary | ICD-10-CM

## 2022-04-15 DIAGNOSIS — I10 Essential (primary) hypertension: Secondary | ICD-10-CM

## 2022-04-15 MED ORDER — METOPROLOL SUCCINATE ER 50 MG PO TB24: 50.0000 mg | ORAL_TABLET | Freq: Every day | ORAL | 0 refills | Status: DC

## 2022-04-15 MED ORDER — ONDANSETRON HCL 4 MG PO TABS: 4.0000 mg | ORAL_TABLET | Freq: Three times a day (TID) | ORAL | 0 refills | Status: DC | PRN

## 2022-04-15 NOTE — Progress Notes (Signed)
Name: Juan Hodges   MRN: [id number]    DOB: 07/08/1976   Date:04/15/2022       Progress Note  Subjective  Chief Complaint  Follow  Up  I connected with  [name]  on 04/15/22 at 11:40 AM EDT by a video enabled telemedicine application and verified that I am speaking with the correct person using two identifiers.  I discussed the limitations of evaluation and management by telemedicine and the availability of in person appointments. The patient expressed understanding and agreed to proceed with the virtual visit  Staff also discussed with the patient that there may be a patient responsible charge related to this service. Patient Location: at home  Provider Location: [facility] Additional Individuals present: alone   HPI  Gastroenteritis: he states he ate dinner at [employer] - barbecue, fries and hush puppies, shortly after noticed bloating and epigastric pain , woke up at 11 pm and had diarrhea described as lose stools no blood or mucus. He had a total of 4 bowel movements during the night and this morning had a couple more episodes. He feels nauseated but no vomiting. Abdominal pain is now a cramping sensation that makes him have a bowel movements. He has been drinking water but no food since last night. No fever or chills. No appetite . His mother gave him motrin and peptobismol . Advised not to take motrin    Stress: he has been very stressed due to new supervisor , they made comments about his CP and made his bp spike Summer 22 and had to go to [facility], now they are telling him that after 28 years working for the [employer]. He was told to re-apply to his position after all those years. He is now trying to transition to another position. 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) 01/01/2016   Hypertension, benign 01/01/2016   Neurogenic bladder 12/19/2009   Migraine without aura and without status migrainosus, not intractable 11/09/2009   Chronic urinary tract infection 08/27/2007    Past Surgical History:  Procedure Laterality Date   FEMUR SURGERY     broken bone   HIP SURGERY     SPINAL FIXATION SURGERY     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, reportedly non-functional for years    Family History  Problem Relation Age of Onset   Hyperlipidemia Mother    Hypertension Mother    Kidney cancer Maternal Grandmother    Kidney disease Neg Hx    Prostate cancer Neg Hx     Social History   Socioeconomic History   Marital status: Single    Spouse name: Not on file   Number of children: 0   Years of education: Not on file   Highest education level: 12th grade  Occupational History    Employer: [employer]  Tobacco Use   Smoking status: Never   Smokeless tobacco: Never   Tobacco comments:    smoking cessation materials not required  Vaping Use   Vaping Use: Never used  Substance and Sexual Activity   Alcohol use: No    Alcohol/week: 0.0 standard drinks of alcohol   Drug use: No   Sexual activity:  Not Currently  Other Topics Concern   Not on file  Social History Narrative   Does not drive   Social Determinants of Health   Financial Resource Strain: Low Risk  (08/06/2021)   Overall Financial Resource Strain (CARDIA)    Difficulty of Paying Living Expenses: Not hard at all  Food Insecurity: No Food Insecurity (08/06/2021)   Hunger Vital Sign    Worried About Running Out of Food in the Last Year: Never true    Ran Out of Food in the Last Year: Never true  Transportation Needs: No Transportation Needs (08/06/2021)   PRAPARE - Administrator, Civil Service (Medical): No    Lack of Transportation (Non-Medical): No  Physical Activity: Inactive (08/06/2021)    Exercise Vital Sign    Days of Exercise per Week: 0 days    Minutes of Exercise per Session: 0 min  Stress: No Stress Concern Present (08/06/2021)   [employer] of Occupational Health - Occupational Stress Questionnaire    Feeling of Stress : Not at all  Social Connections: Socially Isolated (08/06/2021)   Social Connection and Isolation Panel [NHANES]    Frequency of Communication with Friends and Family: More than three times a week    Frequency of Social Gatherings with Friends and Family: More than three times a week    Attends Religious Services: Never    Database administrator or Organizations: No    Attends Banker Meetings: Never    Marital Status: Never married  Intimate Partner Violence: Not At Risk (08/06/2021)   Humiliation, Afraid, Rape, and Kick questionnaire    Fear of Current or Ex-Partner: No    Emotionally Abused: No    Physically Abused: No    Sexually Abused: No     Current Outpatient Medications:    cephALEXin (KEFLEX) 500 MG capsule, Take 500 mg by mouth 4 (four) times daily., Disp: , Rfl:    metoprolol succinate (TOPROL-XL) 50 MG 24 hr tablet, Take 1 tablet (50 mg total) by mouth daily. Take with or immediately following a meal., Disp: 90 tablet, Rfl: 0   rosuvastatin (CRESTOR) 10 MG tablet, TAKE 1 TABLET(10 MG) BY MOUTH DAILY, Disp: 90 tablet, Rfl: 0   Ubrogepant (UBRELVY) 100 MG TABS, Take 1 tablet by mouth every other day as needed., Disp: 15 tablet, Rfl: 2   valsartan-hydrochlorothiazide (DIOVAN-HCT) 160-12.5 MG tablet, Take 1 tablet by mouth daily. In place of lisinopril hctz, Disp: 90 tablet, Rfl: 1   Vitamin D, Ergocalciferol, (DRISDOL) 1.25 MG (50000 UNIT) CAPS capsule, Take 1 capsule (50,000 Units total) by mouth every 7 (seven) days., Disp: 12 capsule, Rfl: 1  Allergies  Allergen Reactions   Augmentin [Amoxicillin-Pot Clavulanate]     GI distress    I personally reviewed active problem list, medication list, allergies, family  history, social history, health maintenance with the patient/caregiver today.   ROS  Ten systems reviewed and is negative except as mentioned in HPI   Objective  Virtual encounter, vitals not obtained.  There is no height or weight on file to calculate BMI.  Physical Exam  Awake , alert and oriented, does not seem dehdydrated   No results found for this or any previous visit (from the past 72 hour(s)).  PHQ2/9:    04/15/2022   11:18 AM 01/13/2022   11:34 AM 10/15/2021   11:14 AM 08/06/2021   10:15 AM 06/19/2021   10:57 AM  Depression screen PHQ 2/9  Decreased Interest 0 0  0 0 0  Down, Depressed, Hopeless 0 0 0 0 0  PHQ - 2 Score 0 0 0 0 0  Altered sleeping  0 0    Tired, decreased energy  0 0    Change in appetite  0 0    Feeling bad or failure about yourself   0 0    Trouble concentrating  0 0    Moving slowly or fidgety/restless  0 0    Suicidal thoughts  0 0    PHQ-9 Score  0 0     PHQ-2/9 Result is negative.    Fall Risk:    04/15/2022   11:17 AM 01/13/2022   11:33 AM 10/15/2021   11:13 AM 08/06/2021   10:17 AM 06/19/2021   10:56 AM  Fall Risk   Falls in the past year? 0 0 0 0 0  Number falls in past yr: 0 0 0 0 0  Injury with Fall? 0 0 0 0 0  Risk for fall due to : No Fall Risks No Fall Risks No Fall Risks No Fall Risks No Fall Risks  Follow up Falls prevention discussed Falls prevention discussed Falls prevention discussed Falls prevention discussed Falls prevention discussed     Assessment & Plan  1. Gastroenteritis  - ondansetron (ZOFRAN) 4 MG tablet; Take 1 tablet (4 mg total) by mouth every 8 (eight) hours as needed for nausea or vomiting.  Dispense: 20 tablet; Refill: 0   I discussed the assessment and treatment plan with the patient. The patient was provided an opportunity to ask questions and all were answered. The patient agreed with the plan and demonstrated an understanding of the instructions.  The patient was advised to call back or seek an  in-person evaluation if the symptoms worsen or if the condition fails to improve as anticipated.  I provided 15  minutes of non-face-to-face time during this encounter.

## 2022-04-30 DIAGNOSIS — I10 Essential (primary) hypertension: Secondary | ICD-10-CM | POA: Diagnosis not present

## 2022-04-30 DIAGNOSIS — M1611 Unilateral primary osteoarthritis, right hip: Secondary | ICD-10-CM | POA: Diagnosis not present

## 2022-04-30 DIAGNOSIS — Z880 Allergy status to penicillin: Secondary | ICD-10-CM | POA: Diagnosis not present

## 2022-04-30 DIAGNOSIS — S72001D Fracture of unspecified part of neck of right femur, subsequent encounter for closed fracture with routine healing: Secondary | ICD-10-CM | POA: Diagnosis not present

## 2022-04-30 DIAGNOSIS — Z8781 Personal history of (healed) traumatic fracture: Secondary | ICD-10-CM | POA: Diagnosis not present

## 2022-04-30 DIAGNOSIS — M79651 Pain in right thigh: Secondary | ICD-10-CM | POA: Diagnosis not present

## 2022-04-30 DIAGNOSIS — Q059 Spina bifida, unspecified: Secondary | ICD-10-CM | POA: Diagnosis not present

## 2022-06-23 ENCOUNTER — Other Ambulatory Visit: Payer: Self-pay | Admitting: Family Medicine

## 2022-06-23 DIAGNOSIS — E7849 Other hyperlipidemia: Secondary | ICD-10-CM

## 2022-06-23 DIAGNOSIS — E7800 Pure hypercholesterolemia, unspecified: Secondary | ICD-10-CM

## 2022-07-10 ENCOUNTER — Other Ambulatory Visit: Payer: Self-pay | Admitting: Family Medicine

## 2022-08-05 ENCOUNTER — Other Ambulatory Visit: Payer: Self-pay | Admitting: Family Medicine

## 2022-08-05 DIAGNOSIS — E7849 Other hyperlipidemia: Secondary | ICD-10-CM

## 2022-08-07 ENCOUNTER — Telehealth: Payer: Self-pay

## 2022-08-07 ENCOUNTER — Ambulatory Visit (INDEPENDENT_AMBULATORY_CARE_PROVIDER_SITE_OTHER): Payer: Medicare Other

## 2022-08-07 VITALS — Ht 64.0 in | Wt 165.0 lb

## 2022-08-07 DIAGNOSIS — Z0000 Encounter for general adult medical examination without abnormal findings: Secondary | ICD-10-CM

## 2022-08-07 DIAGNOSIS — Z1211 Encounter for screening for malignant neoplasm of colon: Secondary | ICD-10-CM

## 2022-08-07 NOTE — Telephone Encounter (Signed)
Received referral from patient's PCP Dr.Sowles for screening colonoscopy.  Spoke to patients mother Pattricia Boss (DPR checked).  His mother said she would like for her son to have a colonoscopy but was not for sure if he would be able to have one done.  Patient has spina-bifida and has no control of his bowels, or urine. He has leg braces. She feels it would be difficult for him to have the colonoscopy.  Please advise as to if patient should be scheduled colonoscopy or advise PCP to have a Colorguard test-possibly.  No family history of colon cancer.  Thanks,  [place], [place]

## 2022-08-07 NOTE — Progress Notes (Addendum)
Subjective:  I connected with  Juan Hodges on 08/07/22 by a audio enabled telemedicine application and verified that I am speaking with the correct person using two identifiers.  Patient Location: Home  Provider Location: Office/Clinic  I discussed the limitations of evaluation and management by telemedicine. The patient expressed understanding and agreed to proceed.  Juan Hodges is a 46 y.o. male who presents for Medicare Annual/Subsequent preventive examination.  Review of Systems    Defer to PCP       Objective:    There were no vitals filed for this visit. There is no height or weight on file to calculate BMI.     08/06/2021   10:16 AM 05/09/2021    6:06 AM 07/31/2020   10:12 AM 05/06/2019   11:08 AM 04/30/2018    1:52 PM 04/27/2017    4:00 PM 04/27/2017    3:14 PM  Advanced Directives  Does Patient Have a Medical Advance Directive? Yes No No No No No No  Type of Media planner of Healthcare Power of Attorney in Chart? No - copy requested        Would patient like information on creating a medical advance directive?  No - Patient declined No - Patient declined No - Patient declined Yes (MAU/Ambulatory/Procedural Areas - Information given)  No - Patient declined    Current Medications (verified) Outpatient Encounter Medications as of 08/07/2022  Medication Sig   cephALEXin (KEFLEX) 500 MG capsule Take 500 mg by mouth 4 (four) times daily.   metoprolol succinate (TOPROL-XL) 50 MG 24 hr tablet Take 1 tablet (50 mg total) by mouth daily. Take with or immediately following a meal.   ondansetron (ZOFRAN) 4 MG tablet Take 1 tablet (4 mg total) by mouth every 8 (eight) hours as needed for nausea or vomiting.   rosuvastatin (CRESTOR) 10 MG tablet TAKE 1 TABLET(10 MG) BY MOUTH DAILY   UBRELVY 100 MG TABS TAKE 1 TABLET BY MOUTH EVERY OTHER DAY AS NEEDED   valsartan-hydrochlorothiazide (DIOVAN-HCT) 160-12.5 MG tablet Take 1 tablet by  mouth daily. In place of lisinopril hctz   Vitamin D, Ergocalciferol, (DRISDOL) 1.25 MG (50000 UNIT) CAPS capsule Take 1 capsule (50,000 Units total) by mouth every 7 (seven) days.   No facility-administered encounter medications on file as of 08/07/2022.    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, reportedly non-functional for years   Family History  Problem Relation Age of Onset   Hyperlipidemia Mother    Hypertension Mother    Kidney cancer Maternal Grandmother    Kidney disease Neg Hx    Prostate cancer Neg Hx    Social History   Socioeconomic History   Marital status: Single    Spouse name: Not on file   Number of children: 0   Years of education: Not on file   Highest education level: 12th grade  Occupational History    Employer: [employer]  Tobacco Use   Smoking status:  Never   Smokeless tobacco: Never   Tobacco comments:    smoking cessation materials not required  Vaping Use   Vaping Use: Never used  Substance and Sexual Activity   Alcohol use: No    Alcohol/week: 0.0 standard drinks of alcohol   Drug use: No   Sexual activity: Not Currently  Other Topics Concern   Not on file  Social History Narrative   Does not drive   Social Determinants of Health   Financial Resource Strain: Low Risk  (08/06/2021)   Overall Financial Resource Strain (CARDIA)    Difficulty of Paying Living Expenses: Not hard at all  Food Insecurity: No Food Insecurity (08/06/2021)   Hunger Vital Sign    Worried About Running Out of Food in the Last Year: Never true    Ran Out of Food in the Last Year: Never true   Transportation Needs: No Transportation Needs (08/06/2021)   PRAPARE - Administrator, Civil Service (Medical): No    Lack of Transportation (Non-Medical): No  Physical Activity: Inactive (08/06/2021)   Exercise Vital Sign    Days of Exercise per Week: 0 days    Minutes of Exercise per Session: 0 min  Stress: No Stress Concern Present (08/06/2021)   [employer] of Occupational Health - Occupational Stress Questionnaire    Feeling of Stress : Not at all  Social Connections: Socially Isolated (08/06/2021)   Social Connection and Isolation Panel [NHANES]    Frequency of Communication with Friends and Family: More than three times a week    Frequency of Social Gatherings with Friends and Family: More than three times a week    Attends Religious Services: Never    Database administrator or Organizations: No    Attends Banker Meetings: Never    Marital Status: Never married    Tobacco Counseling Counseling given: Not Answered Tobacco comments: smoking cessation materials not required   Clinical Intake:                 Diabetic?No          Activities of Daily Living    04/15/2022   11:18 AM 01/13/2022   11:34 AM  In your present state of health, do you have any difficulty performing the following activities:  Hearing? 0 0  Vision? 0 0  Difficulty concentrating or making decisions? 0 0  Walking or climbing stairs? 0 1  Dressing or bathing? 0 0  Doing errands, shopping? 0 1    Patient Care Team: Alba Cory, MD as PCP - General (Family Medicine) Rene Paci, MD as Consulting Physician (Urology)  Indicate any recent Medical Services you may have received from other than [facility] providers in the past year (date may be approximate).     Assessment:   This is a routine wellness examination for Juan Hodges.  Hearing/Vision screen No results found.  Dietary issues and exercise activities discussed:     Goals Addressed    None   Depression Screen    04/15/2022   11:18 AM 01/13/2022   11:34 AM 10/15/2021   11:14 AM 08/06/2021   10:15 AM 06/19/2021   10:57 AM 12/17/2020   10:59 AM 07/31/2020   10:11 AM  PHQ 2/9 Scores  PHQ - 2 Score 0 0 0 0 0 0 0  PHQ- 9 Score  0 0        Fall Risk    04/15/2022   11:17 AM 01/13/2022   11:33  AM 10/15/2021   11:13 AM 08/06/2021   10:17 AM 06/19/2021   10:56 AM  Fall Risk   Falls in the past year? 0 0 0 0 0  Number falls in past yr: 0 0 0 0 0  Injury with Fall? 0 0 0 0 0  Risk for fall due to : No Fall Risks No Fall Risks No Fall Risks No Fall Risks No Fall Risks  Follow up Falls prevention discussed Falls prevention discussed Falls prevention discussed Falls prevention discussed Falls prevention discussed    FALL RISK PREVENTION PERTAINING TO THE HOME:  Any stairs in or around the home? No  If so, are there any without handrails? No  Home free of loose throw rugs in walkways, pet beds, electrical cords, etc? Yes  Adequate lighting in your home to reduce risk of falls? Yes   ASSISTIVE DEVICES UTILIZED TO PREVENT FALLS:  Life alert? No  Use of a cane, walker or w/c? No  Grab bars in the bathroom? Yes  Shower chair or bench in shower? No  Elevated toilet seat or a handicapped toilet? No   TIMED UP AND GO:  Was the test performed? No .  Length of time to ambulate 10 feet: N/A sec.     Cognitive Function:        05/06/2019   11:16 AM 04/30/2018    1:53 PM  6CIT Screen  What Year? 0 points 0 points  What month? 0 points 0 points  What time? 0 points 0 points  Count back from 20 0 points 0 points  Months in reverse 0 points 2 points  Repeat phrase 2 points 2 points  Total Score 2 points 4 points    Immunizations Immunization History  Administered Date(s) Administered   PFIZER(Purple Top)SARS-COV-2 Vaccination 02/08/2020, 02/29/2020   Pneumococcal Polysaccharide-23 06/19/2021    TDAP status: Up to date  Flu Vaccine status: Due, Education has  been provided regarding the importance of this vaccine. Advised may receive this vaccine at local pharmacy or Health Dept. Aware to provide a copy of the vaccination record if obtained from local pharmacy or Health Dept. Verbalized acceptance and understanding.  Pneumococcal vaccine status: Declined,  Education has been provided regarding the importance of this vaccine but patient still declined. Advised may receive this vaccine at local pharmacy or Health Dept. Aware to provide a copy of the vaccination record if obtained from local pharmacy or Health Dept. Verbalized acceptance and understanding.   Covid-19 vaccine status: Completed vaccines  Qualifies for Shingles Vaccine? No   Zostavax completed No   Shingrix Completed?: No.    Education has been provided regarding the importance of this vaccine. Patient has been advised to call insurance company to determine out of pocket expense if they have not yet received this vaccine. Advised may also receive vaccine at local pharmacy or Health Dept. Verbalized acceptance and understanding.  Screening Tests Health Maintenance  Topic Date Due   DTaP/Tdap/Td (1 - Tdap) Never done   COLONOSCOPY (Pts 45-46yrs Insurance coverage will need to be confirmed)  Never done   INFLUENZA VACCINE  Never done   COVID-19 Vaccine (3 - 2023-24 season) 05/09/2022   Medicare Annual Wellness (AWV)  08/08/2023   Hepatitis C Screening  Completed   HIV Screening  Completed   HPV VACCINES  Aged Out    Health Maintenance  Health Maintenance Due  Topic Date Due   DTaP/Tdap/Td (1 - Tdap) Never done   COLONOSCOPY (Pts 45-43yrs Insurance coverage  will need to be confirmed)  Never done   INFLUENZA VACCINE  Never done   COVID-19 Vaccine (3 - 2023-24 season) 05/09/2022    Colorectal cancer screening: Referral to GI placed 08/07/2022. Pt aware the office will call re: appt.  Lung Cancer Screening: (Low Dose CT Chest recommended if Age 33-80 years, 30 pack-year currently  smoking OR have quit w/in 15years.) does not qualify.   Lung Cancer Screening Referral: N/A  Additional Screening:  Hepatitis C Screening: does not qualify; Completed 05/01/2016  Vision Screening: Recommended annual ophthalmology exams for early detection of glaucoma and other disorders of the eye. Is the patient up to date with their annual eye exam?  No  Who is the provider or what is the name of the office in which the patient attends annual eye exams? N/a If pt is not established with a provider, would they like to be referred to a provider to establish care? No .   Dental Screening: Recommended annual dental exams for proper oral hygiene  Community Resource Referral / Chronic Care Management: CRR required this visit?  No   CCM required this visit?  No      Plan:     I have personally reviewed and noted the following in the patient's chart:   Medical and social history Use of alcohol, tobacco or illicit drugs  Current medications and supplements including opioid prescriptions. Patient is not currently taking opioid prescriptions. Functional ability and status Nutritional status Physical activity Advanced directives List of other physicians Hospitalizations, surgeries, and ER visits in previous 12 months Vitals Screenings to include cognitive, depression, and falls Referrals and appointments  In addition, I have reviewed and discussed with patient certain preventive protocols, quality metrics, and best practice recommendations. A written personalized care plan for preventive services as well as general preventive health recommendations were provided to patient.     Benay PikeSandra N Wendolyn Raso, CMA   08/07/2022   Nurse Notes:  Juan Hodges , Thank you for taking time to come for your Medicare Wellness Visit. I appreciate your ongoing commitment to your health goals. Please review the following plan we discussed and let me know if I can assist you in the future.   These are the  goals we discussed:  Goals      DIET - INCREASE WATER INTAKE     Recommend to drink at least 6-8 8oz glasses of water per day.     Eat more fruits and vegetables     Recommend to increase the amount of fruits and vegetables in daily diet to at least 1 serving of each.         This is a list of the screening recommended for you and due dates:  Health Maintenance  Topic Date Due   DTaP/Tdap/Td vaccine (1 - Tdap) Never done   Colon Cancer Screening  Never done   Flu Shot  Never done   COVID-19 Vaccine (3 - 2023-24 season) 05/09/2022   Medicare Annual Wellness Visit  08/08/2023   Hepatitis C Screening: USPSTF Recommendation to screen - Ages 18-79 yo.  Completed   HIV Screening  Completed   HPV Vaccine  Aged Out     I have reviewed this encounter including the documentation in this note and/or discussed this patient with the provider, Freada BergeronSandra Barrett Holthaus, CMA. I am certifying that I agree with the content of this note as supervising physician.  Alba CoryKrichna Sowles, MD [facility] 08/11/2022, 6:59 PM

## 2022-08-07 NOTE — Telephone Encounter (Signed)
Recommend cologuard and if positive refer back for colonoscopy  RV

## 2022-08-08 ENCOUNTER — Other Ambulatory Visit: Payer: Self-pay | Admitting: Family Medicine

## 2022-08-08 DIAGNOSIS — Z1211 Encounter for screening for malignant neoplasm of colon: Secondary | ICD-10-CM

## 2022-10-20 ENCOUNTER — Other Ambulatory Visit: Payer: Self-pay | Admitting: Internal Medicine

## 2022-10-21 ENCOUNTER — Other Ambulatory Visit: Payer: Self-pay | Admitting: Family Medicine

## 2022-10-21 DIAGNOSIS — E7849 Other hyperlipidemia: Secondary | ICD-10-CM

## 2022-10-21 DIAGNOSIS — I10 Essential (primary) hypertension: Secondary | ICD-10-CM

## 2022-10-21 DIAGNOSIS — G43009 Migraine without aura, not intractable, without status migrainosus: Secondary | ICD-10-CM

## 2022-10-21 MED ORDER — METOPROLOL SUCCINATE ER 50 MG PO TB24: 50.0000 mg | ORAL_TABLET | Freq: Every day | ORAL | 0 refills | Status: DC

## 2022-10-21 MED ORDER — VALSARTAN-HYDROCHLOROTHIAZIDE 160-12.5 MG PO TABS: 1.0000 | ORAL_TABLET | Freq: Every day | ORAL | 0 refills | Status: DC

## 2022-10-21 MED ORDER — ROSUVASTATIN CALCIUM 10 MG PO TABS: ORAL_TABLET | ORAL | 0 refills | Status: DC

## 2022-10-21 NOTE — Telephone Encounter (Signed)
Requested medications are due for refill today.  Unsure  Requested medications are on the active medications list.  yes  Last refill. unsure  Future visit scheduled.   yes  Notes to clinic.  Medication listed as historical.    Requested Prescriptions  Pending Prescriptions Disp Refills   lisinopril-hydrochlorothiazide (ZESTORETIC) 20-25 MG tablet [Pharmacy Med Name: LISINOPRIL-HCTZ 20/25MG TABLETS] 90 tablet     Sig: Take 1 tablet by mouth daily.     Cardiovascular:  ACEI + Diuretic Combos Failed - 10/20/2022  1:03 PM      Failed - Na in normal range and within 180 days    Sodium  Date Value Ref Range Status  12/17/2020 144 135 - 146 mmol/L Final  05/01/2016 141 134 - 144 mmol/L Final         Failed - K in normal range and within 180 days    Potassium  Date Value Ref Range Status  12/17/2020 4.5 3.5 - 5.3 mmol/L Final         Failed - Cr in normal range and within 180 days    Creat  Date Value Ref Range Status  12/17/2020 1.09 0.60 - 1.35 mg/dL Final   Creatinine, Urine  Date Value Ref Range Status  07/31/2021 295 20 - 320 mg/dL Final         Failed - eGFR is 30 or above and within 180 days    GFR, Est African American  Date Value Ref Range Status  12/17/2020 95 > OR = 60 mL/min/1.52m Final   GFR, Est Non African American  Date Value Ref Range Status  12/17/2020 82 > OR = 60 mL/min/1.736mFinal         Passed - Patient is not pregnant      Passed - Last BP in normal range    BP Readings from Last 1 Encounters:  01/13/22 138/86         Passed - Valid encounter within last 6 months    Recent Outpatient Visits           [facility]   9 months [facility]   1 year [facility] Vitamin D deficiency   [facility]   [facility]   1 year ago [facility]pertension, benign   [facility]       Future Appointments             In 3 weeks [facility]

## 2022-10-21 NOTE — Telephone Encounter (Signed)
Appt sch'd for 3.6.2024 with Dr Ancil Boozer

## 2022-10-22 NOTE — Telephone Encounter (Signed)
Patient's mother called in stating that patient wants to continue the medication he was taking until he sees Dr. Ancil Boozer in March. Patient's mother named metoprolol as one of the medications and another blood pressure medicine she didn't know the name of, but suggested it started with an "L". Patient's mother says if there are any questions, you can follow up with her.

## 2022-10-23 ENCOUNTER — Other Ambulatory Visit: Payer: Self-pay | Admitting: Family Medicine

## 2022-11-11 NOTE — Progress Notes (Signed)
Name: Juan Hodges   MRN: [id number]    DOB: 12-16-1975   Date:11/12/2022       Progress Note  Subjective  Chief Complaint  Medication Refill  HPI  HTN: he is now on Lisinopril/hctz and metoprolol, we discussed stopping HCTZ due to urinary frequency but he decided to stay on the current regiment since it has been working well for him. No chest pain, palpitation or SOB    Vitamin D deficiency: he has been taking otc vitamin D now, he prefers the prescription due to cost , he will have blood work done today    Hyperlipidemia:he cannot take Crestor - it causes muscle pain, we will try Zetia again, once he said it caused diarrhea but we will try once more   Hyperglycemia: history of elevated A1C, denies polyphagia, polydipsia . We will recheck labs   Migraine:  He describes migraine episodes  as throbbing, frontal, no phonophobia but has phonophobia. No nausea or vomiting. He states Imitrex spiked bp, he is now taking Ubrelvy at most twice a week and works well for him    Neurogenic bladder/recurrent UTI:/spina bifida: seeing Urologist - Dr. Liliane [facility] at [facility]  , he takes cephalexin prn only.  He states that symptoms usually consists of strong odor but no problems at this time,  still self cath at least 3 times per day.  Gait is stable , he wears foot braces to assist with his gait . [facility]    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, reportedly non-functional for years    Family History  Problem Relation Age of Onset   Hyperlipidemia Mother    Hypertension Mother    Kidney cancer Maternal Grandmother    Kidney disease Neg Hx    Prostate cancer Neg Hx     Social History   Tobacco Use   Smoking status: Never   Smokeless tobacco: Never   Tobacco comments:    smoking cessation materials not required  Substance Use Topics   Alcohol use: No    Alcohol/week: 0.0 standard drinks of alcohol     Current Outpatient Medications:    cephALEXin (KEFLEX) 500 MG capsule, Take 500 mg by mouth 4 (four) times daily., Disp: , Rfl:    lisinopril-hydrochlorothiazide (ZESTORETIC) 20-25 MG tablet, TAKE 1 TABLET BY MOUTH DAILY, Disp: 90 tablet, Rfl: 0   meloxicam (MOBIC) 15 MG tablet, Take 15 mg by mouth daily., Disp: , Rfl:    metoprolol succinate (TOPROL-XL) 50 MG 24 hr tablet, Take 1 tablet (50 mg total) by mouth daily. Take with or immediately following a meal., Disp: 90 tablet, Rfl: 0   ondansetron (ZOFRAN) 4 MG tablet, Take 1 tablet (4 mg total) by mouth every 8 (eight) hours as needed for nausea or vomiting., Disp: 20 tablet, Rfl: 0   rosuvastatin (CRESTOR)  10 MG tablet, TAKE 1 TABLET(10 MG) BY MOUTH DAILY, Disp: 90 tablet, Rfl: 0   UBRELVY 100 MG TABS, TAKE 1 TABLET BY MOUTH EVERY OTHER DAY AS NEEDED, Disp: 15 tablet, Rfl: 2   Vitamin D, Ergocalciferol, (DRISDOL) 1.25 MG (50000 UNIT) CAPS capsule, Take 1 capsule (50,000 Units total) by mouth every 7 (seven) days., Disp: 12 capsule, Rfl: 1  Allergies  Allergen Reactions   Augmentin [Amoxicillin-Pot Clavulanate]     GI distress    I personally reviewed active problem list, medication list, allergies, family history, social history, health maintenance with the patient/caregiver today.   ROS  Ten systems reviewed and is negative except as mentioned in HPI   Objective  Vitals:   11/12/22 0910  BP:  132/76  Pulse: 90  Resp: 16  SpO2: 96%  Weight: 166 lb (75.3 kg)  Height: 5\' 4"  (1.626 m)    Body mass index is 28.49 kg/m.  Physical Exam  Constitutional: Patient appears well-developed and well-nourished.  No distress.  HEENT: head atraumatic, normocephalic, pupils equal and reactive to light, neck supple Cardiovascular: Normal rate, regular rhythm and normal heart sounds.  No murmur heard. No BLE edema. Pulmonary/Chest: Effort normal and breath sounds normal. No respiratory distress. Abdominal: Soft.  There is no tenderness. Muscular skeletal: he wears braces on both legs, wide base walk  Psychiatric: Patient has a normal mood and affect. behavior is normal. Judgment and thought content normal.   PHQ2/9:    11/12/2022    9:09 AM 08/07/2022    9:53 AM 08/07/2022    9:50 AM 04/15/2022   11:18 AM 01/13/2022   11:34 AM  Depression screen PHQ 2/9  Decreased Interest 0 0 0 0 0  Down, Depressed, Hopeless 0 0 0 0 0  PHQ - 2 Score 0 0 0 0 0  Altered sleeping 0 0   0  Tired, decreased energy 0 0   0  Change in appetite 0 0   0  Feeling bad or failure about yourself  0 0   0  Trouble concentrating 0 1   0  Moving slowly or fidgety/restless 0 0   0  Suicidal thoughts 0 0   0  PHQ-9 Score 0 1   0  Difficult doing work/chores  Not difficult at all       phq 9 is negative   Fall Risk:    11/12/2022    9:09 AM 08/07/2022    9:52 AM 04/15/2022   11:17 AM 01/13/2022   11:33 AM 10/15/2021   11:13 AM  Fall Risk   Falls in the past year? 0 1 0 0 0  Number falls in past yr: 0 0 0 0 0  Injury with Fall? 0 0 0 0 0  Risk for fall due to : No Fall Risks History of fall(s) No Fall Risks No Fall Risks No Fall Risks  Follow up Falls prevention discussed Education provided;Falls prevention discussed Falls prevention discussed Falls prevention discussed Falls prevention discussed      Functional Status Survey: Is the patient deaf or have difficulty hearing?: No Does the patient have  difficulty seeing, even when wearing glasses/contacts?: No Does the patient have difficulty concentrating, remembering, or making decisions?: No Does the patient have difficulty walking or climbing stairs?: Yes Does the patient have difficulty dressing or bathing?: No Does the patient have difficulty doing errands alone such as visiting a doctor's office or shopping?: Yes    Assessment & Plan  1.  Hypertension, benign  - lisinopril-hydrochlorothiazide (ZESTORETIC) 20-25 MG tablet; Take 1 tablet by mouth daily.  Dispense: 90 tablet; Refill: 0 - metoprolol succinate (TOPROL-XL) 50 MG 24 hr tablet; Take 1 tablet (50 mg total) by mouth daily. Take with or immediately following a meal.  Dispense: 90 tablet; Refill: 0 - CBC with Differential/Platelet - COMPLETE METABOLIC PANEL WITH GFR  2. Spina bifida aperta of lumbar spine [facility])  [facility]   3. Migraine without aura and without status migrainosus, not intractable  - metoprolol succinate (TOPROL-XL) 50 MG 24 hr tablet; Take 1 tablet (50 mg total) by mouth daily. Take with or immediately following a meal.  Dispense: 90 tablet; Refill: 0  4. Vitamin D deficiency  - Vitamin D, Ergocalciferol, (DRISDOL) 1.25 MG (50000 UNIT) CAPS capsule; Take 1 capsule (50,000 Units total) by mouth every 7 (seven) days.  Dispense: 12 capsule; Refill: 1  5. Familial hyperlipidemia  - Lipid panel - ezetimibe (ZETIA) 10 MG tablet; Take 1 tablet (10 mg total) by mouth daily.  Dispense: 90 tablet; Refill: 1  6. Gait disturbance  Stable, due to spina bifida also fracture of right femur years ago   7. Self-catheterizes urinary bladder   8. Neurogenic bladder   9. Hyperglycemia  - Hemoglobin A1c  10. Colon cancer screening  - Fecal Globin By Immunochemistry  11. Statin myopathy

## 2022-11-12 ENCOUNTER — Encounter: Payer: Self-pay | Admitting: Family Medicine

## 2022-11-12 ENCOUNTER — Ambulatory Visit (INDEPENDENT_AMBULATORY_CARE_PROVIDER_SITE_OTHER): Payer: Medicare Other | Admitting: Family Medicine

## 2022-11-12 VITALS — BP 132/76 | HR 90 | Resp 16 | Ht 64.0 in | Wt 166.0 lb

## 2022-11-12 DIAGNOSIS — G43009 Migraine without aura, not intractable, without status migrainosus: Secondary | ICD-10-CM | POA: Diagnosis not present

## 2022-11-12 DIAGNOSIS — R269 Unspecified abnormalities of gait and mobility: Secondary | ICD-10-CM

## 2022-11-12 DIAGNOSIS — E7849 Other hyperlipidemia: Secondary | ICD-10-CM

## 2022-11-12 DIAGNOSIS — Q057 Lumbar spina bifida without hydrocephalus: Secondary | ICD-10-CM | POA: Diagnosis not present

## 2022-11-12 DIAGNOSIS — R739 Hyperglycemia, unspecified: Secondary | ICD-10-CM | POA: Diagnosis not present

## 2022-11-12 DIAGNOSIS — Z789 Other specified health status: Secondary | ICD-10-CM | POA: Diagnosis not present

## 2022-11-12 DIAGNOSIS — T466X5A Adverse effect of antihyperlipidemic and antiarteriosclerotic drugs, initial encounter: Secondary | ICD-10-CM

## 2022-11-12 DIAGNOSIS — I10 Essential (primary) hypertension: Secondary | ICD-10-CM

## 2022-11-12 DIAGNOSIS — G720 Drug-induced myopathy: Secondary | ICD-10-CM | POA: Diagnosis not present

## 2022-11-12 DIAGNOSIS — E559 Vitamin D deficiency, unspecified: Secondary | ICD-10-CM

## 2022-11-12 DIAGNOSIS — Z1211 Encounter for screening for malignant neoplasm of colon: Secondary | ICD-10-CM

## 2022-11-12 DIAGNOSIS — N319 Neuromuscular dysfunction of bladder, unspecified: Secondary | ICD-10-CM

## 2022-11-12 MED ORDER — EZETIMIBE 10 MG PO TABS: 10.0000 mg | ORAL_TABLET | Freq: Every day | ORAL | 1 refills | Status: DC

## 2022-11-12 MED ORDER — VITAMIN D (ERGOCALCIFEROL) 1.25 MG (50000 UNIT) PO CAPS: 50000.0000 [IU] | ORAL_CAPSULE | ORAL | 1 refills | Status: DC

## 2022-11-12 MED ORDER — LISINOPRIL-HYDROCHLOROTHIAZIDE 20-25 MG PO TABS: 1.0000 | ORAL_TABLET | Freq: Every day | ORAL | 0 refills | Status: DC

## 2022-11-12 MED ORDER — METOPROLOL SUCCINATE ER 50 MG PO TB24: 50.0000 mg | ORAL_TABLET | Freq: Every day | ORAL | 0 refills | Status: DC

## 2022-11-13 LAB — LIPID PANEL
Cholesterol: 298 mg/dL — ABNORMAL HIGH (ref <200)
HDL: 71 mg/dL (ref >=40)
LDL Cholesterol (Calc): 204 mg/dL (calc) — ABNORMAL HIGH
Non-HDL Cholesterol (Calc): 227 mg/dL (calc) — ABNORMAL HIGH (ref <130)
Total CHOL/HDL Ratio: 4.2 (calc) (ref <5.0)
Triglycerides: 100 mg/dL (ref <150)

## 2022-11-13 LAB — CBC WITH DIFFERENTIAL/PLATELET
Absolute Monocytes: 580 cells/uL (ref 200–950)
Basophils Absolute: 28 cells/uL (ref 0–200)
Basophils Relative: 0.4 %
Eosinophils Absolute: 110 cells/uL (ref 15–500)
Eosinophils Relative: 1.6 %
HCT: 49.5 % (ref 38.5–50.0)
Hemoglobin: 16.8 g/dL (ref 13.2–17.1)
Lymphs Abs: 2132 cells/uL (ref 850–3900)
MCH: 29.1 pg (ref 27.0–33.0)
MCHC: 33.9 g/dL (ref 32.0–36.0)
MCV: 85.8 fL (ref 80.0–100.0)
MPV: 9.9 fL (ref 7.5–12.5)
Monocytes Relative: 8.4 %
Neutro Abs: 4050 cells/uL (ref 1500–7800)
Neutrophils Relative %: 58.7 %
Platelets: 383 10*3/uL (ref 140–400)
RBC: 5.77 10*6/uL (ref 4.20–5.80)
RDW: 12.6 % (ref 11.0–15.0)
Total Lymphocyte: 30.9 %
WBC: 6.9 10*3/uL (ref 3.8–10.8)

## 2022-11-13 LAB — COMPLETE METABOLIC PANEL WITH GFR
AG Ratio: 1.5 (calc) (ref 1.0–2.5)
ALT: 16 U/L (ref 9–46)
AST: 15 U/L (ref 10–40)
Albumin: 4.6 g/dL (ref 3.6–5.1)
Alkaline phosphatase (APISO): 82 U/L (ref 36–130)
BUN: 15 mg/dL (ref 7–25)
CO2: 27 mmol/L (ref 20–32)
Calcium: 10 mg/dL (ref 8.6–10.3)
Chloride: 101 mmol/L (ref 98–110)
Creat: 0.99 mg/dL (ref 0.60–1.29)
Globulin: 3 g/dL (calc) (ref 1.9–3.7)
Glucose, Bld: 97 mg/dL (ref 65–99)
Potassium: 3.6 mmol/L (ref 3.5–5.3)
Sodium: 140 mmol/L (ref 135–146)
Total Bilirubin: 0.6 mg/dL (ref 0.2–1.2)
Total Protein: 7.6 g/dL (ref 6.1–8.1)
eGFR: 95 mL/min/{1.73_m2} (ref >=60)

## 2022-11-13 LAB — HEMOGLOBIN A1C
Hgb A1c MFr Bld: 5.7 % of total Hgb — ABNORMAL HIGH (ref <5.7)
Mean Plasma Glucose: 117 mg/dL
eAG (mmol/L): 6.5 mmol/L

## 2022-11-17 ENCOUNTER — Telehealth: Payer: Self-pay

## 2022-11-17 NOTE — Telephone Encounter (Signed)
Call from pt's mother, Ms. Arnoldo Morale. She wanted [place] to call pt with lab results.  I had already spoke to pt at 1:49pm today.

## 2022-11-17 NOTE — Telephone Encounter (Signed)
Pt called for lab results. Shared provider's note. Bad cholesterol is very high again, very high risk of heart attacks and strokes due to familial hyperlipidemia , please take medication daily or if unable to tolerate we can try injectables every 2 weeks A1C is in the pre diabetes range CBC is back to normal now Sugar, kidney and liver function tests are within normal limits  Written by Steele Sizer, MD on 11/14/2022 11:38 AM EST

## 2023-01-05 DIAGNOSIS — R3914 Feeling of incomplete bladder emptying: Secondary | ICD-10-CM | POA: Diagnosis not present

## 2023-01-05 DIAGNOSIS — N3021 Other chronic cystitis with hematuria: Secondary | ICD-10-CM | POA: Diagnosis not present

## 2023-01-07 ENCOUNTER — Encounter: Payer: Self-pay | Admitting: Family Medicine

## 2023-01-08 ENCOUNTER — Other Ambulatory Visit: Payer: Self-pay | Admitting: Family Medicine

## 2023-01-08 DIAGNOSIS — E7849 Other hyperlipidemia: Secondary | ICD-10-CM

## 2023-02-07 ENCOUNTER — Other Ambulatory Visit: Payer: Self-pay | Admitting: Family Medicine

## 2023-02-07 DIAGNOSIS — I10 Essential (primary) hypertension: Secondary | ICD-10-CM

## 2023-02-09 ENCOUNTER — Other Ambulatory Visit: Payer: Self-pay

## 2023-02-09 ENCOUNTER — Emergency Department (HOSPITAL_COMMUNITY): Payer: Medicare Other

## 2023-02-09 ENCOUNTER — Encounter (HOSPITAL_COMMUNITY): Payer: Self-pay

## 2023-02-09 ENCOUNTER — Emergency Department (HOSPITAL_COMMUNITY)
Admission: EM | Admit: 2023-02-09 | Discharge: 2023-02-09 | Disposition: A | Discharge status: Home / Self Care | Payer: Worker's Compensation | Attending: Emergency Medicine | Admitting: Emergency Medicine

## 2023-02-09 DIAGNOSIS — W010XXA Fall on same level from slipping, tripping and stumbling without subsequent striking against object, initial encounter: Secondary | ICD-10-CM | POA: Diagnosis not present

## 2023-02-09 DIAGNOSIS — Y990 Civilian activity done for income or pay: Secondary | ICD-10-CM | POA: Insufficient documentation

## 2023-02-09 DIAGNOSIS — M79604 Pain in right leg: Secondary | ICD-10-CM | POA: Diagnosis present

## 2023-02-09 DIAGNOSIS — I10 Essential (primary) hypertension: Secondary | ICD-10-CM | POA: Insufficient documentation

## 2023-02-09 DIAGNOSIS — Y92091 Bathroom in other non-institutional residence as the place of occurrence of the external cause: Secondary | ICD-10-CM | POA: Insufficient documentation

## 2023-02-09 MED ORDER — IBUPROFEN 200 MG PO TABS
600.0000 mg | ORAL_TABLET | Freq: Once | ORAL | Status: AC
  Administered 2023-02-09: 600 mg via ORAL
  Filled 2023-02-09: qty 3

## 2023-02-09 NOTE — ED Provider Notes (Signed)
[facility]ERGENCY DEPARTMENT AT [facility] Provider Note   CSN: [id number] Arrival date & time: 02/09/23  2107     History  Chief Complaint  Patient presents with   Fall   Leg Pain    Juan Hodges is a 47 y.o. male with medical history of chronic UTI, hypertension, femur surgery at unknown date.  Patient presents to ED for evaluation of right upper leg pain.  Patient reports he was at work when he slipped on water in the bathroom falling backwards and landing on his right hip.  He states he had pain in his right femur area at this time.  He has a history of femur surgery at an unknown date, maybe 5 years ago he states.  He denies hitting his head or losing consciousness.  He denies back pain.  He denies medications prior to arrival.   Fall  Leg Pain      Home Medications Prior to Admission medications   Medication Sig Start Date End Date Taking? Authorizing Provider  ezetimibe (ZETIA) 10 MG tablet Take 1 tablet (10 mg total) by mouth daily. 11/12/22  Yes Sowles, Danna Hefty, MD  ibuprofen (ADVIL) 200 MG tablet Take 200 mg by mouth every 6 (six) hours as needed for mild pain.   Yes [provider]  lisinopril-hydrochlorothiazide (ZESTORETIC) 20-25 MG tablet TAKE 1 TABLET BY MOUTH DAILY 02/08/23  Yes Sowles, Danna Hefty, MD  UBRELVY 100 MG TABS TAKE 1 TABLET BY MOUTH EVERY OTHER DAY AS NEEDED Patient taking differently: Take 1 tablet by mouth every other day as needed (For migraine). 02/08/23  Yes Sowles, Danna Hefty, MD  cephALEXin (KEFLEX) 500 MG capsule Take 500 mg by mouth 4 (four) times daily. Patient not taking: Reported on 02/09/2023    Rene Paci, MD  metoprolol succinate (TOPROL-XL) 50 MG 24 hr tablet Take 1 tablet (50 mg total) by mouth daily. Take with or immediately following a meal. Patient not taking: Reported on 02/09/2023 11/12/22   Alba Cory, MD  Vitamin D, Ergocalciferol, (DRISDOL) 1.25 MG (50000 UNIT) CAPS capsule Take 1 capsule (50,000  Units total) by mouth every 7 (seven) days. Patient not taking: Reported on 02/09/2023 11/12/22   Alba Cory, MD      Allergies    Augmentin [amoxicillin-pot clavulanate]    Review of Systems   Review of Systems  Musculoskeletal:  Positive for arthralgias and myalgias.  All other systems reviewed and are negative.   Physical Exam Updated Vital Signs BP (!) 162/104   Pulse 96   Temp 98.5 F (36.9 C) (Oral)   Resp 17   Ht 5\' 4"  (1.626 m)   Wt 63.5 kg   SpO2 98%   BMI 24.03 kg/m  Physical Exam Vitals and nursing note reviewed.  Constitutional:      General: He is not in acute distress.    Appearance: Normal appearance. He is not ill-appearing, toxic-appearing or diaphoretic.  HENT:     Head: Normocephalic and atraumatic.     Nose: Nose normal.     Mouth/Throat:     Mouth: Mucous membranes are moist.     Pharynx: Oropharynx is clear.  Eyes:     Extraocular Movements: Extraocular movements intact.     Conjunctiva/sclera: Conjunctivae normal.     Pupils: Pupils are equal, round, and reactive to light.  Cardiovascular:     Rate and Rhythm: Normal rate and regular rhythm.  Pulmonary:     Effort: Pulmonary effort is normal.  Breath sounds: Normal breath sounds. No wheezing.  Abdominal:     General: Abdomen is flat. Bowel sounds are normal.     Palpations: Abdomen is soft.     Tenderness: There is no abdominal tenderness.  Musculoskeletal:     Cervical back: Normal range of motion and neck supple. No tenderness.     Right upper leg: Tenderness present. No swelling, edema or deformity.     Comments: Tenderness to anterior portion of right upper extremity.  No shortening or rotation of right lower extremity.  No hip pain.  Full range of motion of knee and ankle.  No overlying skin change.  Skin:    General: Skin is warm and dry.     Capillary Refill: Capillary refill takes less than 2 seconds.  Neurological:     Mental Status: He is alert and oriented to person,  place, and time.     ED Results / Procedures / Treatments   Labs (all labs ordered are listed, but only abnormal results are displayed) Labs Reviewed - No data to display  EKG None  Radiology DG FEMUR PORT, 1V RIGHT  Result Date: 02/09/2023 CLINICAL DATA:  Pain EXAM: RIGHT FEMUR PORTABLE 1 VIEW COMPARISON:  Right hip x-ray 04/12/2012 FINDINGS: There are 3 orthopedic pins in the right femoral neck. There is no acute fracture or dislocation. There are mild degenerative changes of the right hip. Soft tissues are within normal limits. IMPRESSION: 1. No acute fracture or dislocation. 2. Mild degenerative changes of the right hip. Electronically Signed   By: Darliss Cheney M.D.   On: 02/09/2023 22:06    Procedures Procedures   Medications Ordered in ED Medications  ibuprofen (ADVIL) tablet 600 mg (600 mg Oral Given 02/09/23 2153)    ED Course/ Medical Decision Making/ A&P  Medical Decision Making Amount and/or Complexity of Data Reviewed Radiology: ordered.  Risk OTC drugs.   47 year old presents ED for evaluation of fall.  Please see HPI for further details.  On examination the patient has no overlying skin change to his right anterior upper leg.  There is no shortening or rotation of his right lower extremity.  He has 5 out of 5 strength to his bilateral lower extremities.  He has full range of motion of hip, knee and ankle.  No overlying skin change appreciated.  X-ray imaging unremarkable for acute fractures.  Patient provided ibuprofen and states pain is decreased at this time.  Patient will be discharged and advised to follow-up with PCP.  Return precautions provided and he voiced understanding.  All questions answered to satisfaction.  Stable to discharge at this time.   Final Clinical Impression(s) / ED Diagnoses Final diagnoses:  Right leg pain    Rx / DC Orders ED Discharge Orders     None         Clent Ridges 02/09/23 2225    Lorre Nick,  MD 02/10/23 1623

## 2023-02-09 NOTE — ED Triage Notes (Signed)
Patient states he was at work and fell while in the bathroom due to the floor being wet. Reports leg pain rate 5/10.

## 2023-02-09 NOTE — Discharge Instructions (Signed)
Return to the ED with any new or worsening signs or symptoms Please follow-up with your PCP for reevaluation You may continue taking ibuprofen for pain as needed at home See attached work note

## 2023-02-24 ENCOUNTER — Other Ambulatory Visit: Payer: Self-pay

## 2023-03-02 ENCOUNTER — Other Ambulatory Visit: Payer: Self-pay | Admitting: Family Medicine

## 2023-03-02 ENCOUNTER — Ambulatory Visit: Payer: Self-pay

## 2023-03-02 DIAGNOSIS — E7849 Other hyperlipidemia: Secondary | ICD-10-CM

## 2023-03-02 NOTE — Telephone Encounter (Signed)
Medication Refill - Medication: rosuvastatin (CRESTOR) 10 MG tablet [[id number]]  DISCONTINUED   Has the patient contacted their pharmacy? Yes.   (Agent: If no, request that the patient contact the pharmacy for the refill. If patient does not wish to contact the pharmacy document the reason why and proceed with request.) (Agent: If yes, when and what did the pharmacy advise?)  Preferred Pharmacy (with phone number or street name): [id number] [place], [place] - [address] AT [facility] OF [address] [place], [place] [address] Phone: [phone]  Fax: [phone]  Has the patient been seen for an appointment in the last year OR does the patient have an upcoming appointment? Yes.    Agent: Please be advised that RX refills may take up to 3 business days. We ask that you follow-up with your pharmacy.

## 2023-03-02 NOTE — Telephone Encounter (Signed)
  Chief Complaint: Medication question Symptoms: none Frequency: see note Pertinent Negatives: Patient denies  Disposition: [] ED /[] Urgent Care (no appt availability in office) / [] Appointment(In office/virtual)/ []  [place] [facility]/ [] Home Care/ [] Refused Recommended Disposition /[] [facility]/ [x]  Follow-up with PCP Additional Notes: Call from pharmacy requesting refill of rosuvastatin. This medication appears to be discontinued, but I do not see encounter where this is documented. Please review and advise, if pt should still be taking this medication.  Called pt's mother to let her know of situation. She would like a call back verifying if pt should be taking this medication as well as pharmacy.    Summary: Medication Advice   Pharmacy is calling for refill for  rosuvastatin (CRESTOR) 10 MG tablet [[id number]]  DISCONTINUED. Pharmacy noted that the patient is currently taking ezetimibe (ZETIA) 10 MG tablet [[id number]]. Should the patient be taking both medications? Please advise with the patient         Reason for Disposition  [1] Pharmacy calling with prescription question AND [2] triager unable to answer question  Answer Assessment - Initial Assessment Questions 1. NAME of MEDICINE: "What medicine(s) are you calling about?"     Rosuvastatin 2. QUESTION: "What is your question?" (e.g., double dose of medicine, side effect)     Should pt still be taking this medication?  Protocols used: Medication Question Call-A-AH

## 2023-03-03 ENCOUNTER — Telehealth: Payer: Self-pay

## 2023-03-03 NOTE — Telephone Encounter (Signed)
Relayed per Dr Carlynn Purl, "he does not take crestor, just zetia due to muscle aches "

## 2023-03-03 NOTE — Telephone Encounter (Signed)
Requested by interface surescripts. Discontinued medication 11/12/22. Requested Prescriptions  Refused Prescriptions Disp Refills   rosuvastatin (CRESTOR) 10 MG tablet 90 tablet 0    Sig: Take 1 tablet (10 mg total) by mouth daily.     Cardiovascular:  Antilipid - Statins 2 Failed - 03/03/2023  8:05 AM      Failed - Lipid Panel in normal range within the last 12 months    Cholesterol, Total  Date Value Ref Range Status  05/01/2016 265 (H) 100 - 199 mg/dL Final   Cholesterol  Date Value Ref Range Status  11/12/2022 298 (H) <200 mg/dL Final   LDL Cholesterol (Calc)  Date Value Ref Range Status  11/12/2022 204 (H) mg/dL (calc) Final    Comment:    LDL-C levels > or = 190 mg/dL may indicate familial  hypercholesterolemia (FH). Clinical assessment and  measurement of blood lipid levels should be  considered for all first degree relatives of patients with an FH diagnosis. LDL Cholesterol (LDL-C) levels > or = 300 mg/dL may indicate homozygous familial hypercholesterolemia (HoFH). Untreated,  these extremely high LDL-C levels can result in premature CV events and mortality. Patients should be identified early and provided appropriate interventions to reduce the cumulative LDL-C burden from birth. . For questions about testing for familial hypercholesterolemia, please call Engineer, materials at 1.866.GENE.INFO. Wardell Honour, et al. J National Lipid Association  Recommendations for Patient-Centered Management of Dyslipidemia: Part 1 Journal of  Clinical Lipidology 2015;9(2), 129-169. Cuchel, M. et al. (2014). Homozygous familial hypercholesterolaemia: new insights and guidance for clinicians to improve detection and clinical management. European Heart Journal, 35(32), (828) 206-1090. Reference range: <100 . Desirable range <100 mg/dL for primary prevention;   <70 mg/dL for patients with CHD or diabetic patients  with > or = 2 CHD risk factors. Marland Kitchen LDL-C is now calculated  using the Martin-Hopkins  calculation, which is a validated novel method providing  better accuracy than the Friedewald equation in the  estimation of LDL-C.  Horald Pollen et al. Lenox Ahr. 5409;811(91): 2061-2068  (http://education.QuestDiagnostics.com/faq/FAQ164)    HDL  Date Value Ref Range Status  11/12/2022 71 > OR = 40 mg/dL Final  47/82/9562 64 >13 mg/dL Final   Triglycerides  Date Value Ref Range Status  11/12/2022 100 <150 mg/dL Final         Passed - Cr in normal range and within 360 days    Creat  Date Value Ref Range Status  11/12/2022 0.99 0.60 - 1.29 mg/dL Final   Creatinine, Urine  Date Value Ref Range Status  07/31/2021 295 20 - 320 mg/dL Final         Passed - Patient is not pregnant      Passed - Valid encounter within last 12 months    Recent Outpatient Visits           3 months ago 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)   [facility] Alba Cory, MD   10 months ago Gastroenteritis   [facility] Alba Cory, MD   1 year ago Familial hyperlipidemia   [facility] Alba Cory, MD   1 year ago Vitamin D deficiency   [facility] Alba Cory, MD   1 year ago Spina bifida aperta of lumbar spine [facility])   [facility] Alba Cory, MD       Future Appointments  In 1 month Alba Cory, MD [facility]   In 6 months Alba Cory, MD [facility], [facility]

## 2023-04-13 NOTE — Progress Notes (Unsigned)
Name: Juan Hodges   MRN: [id number]    DOB: 08/29/1976   Date:04/14/2023       Progress Note  Subjective  Chief Complaint  Follow up  HPI  HTN: he is now on Lisinopril/hctz and metoprolol, we discussed stopping HCTZ due to urinary frequency but he decided to stay on the current regiment since it has been working well for him. No chest pain, palpitation or SOB . BP was elevated during recent visit to [facility] and also today, he usually takes bp medication around lunch time. We will add norvasc 2.5 mg today and advised to return in 2 weeks for BP and in 3 months for follow up  Stress: he continues to feel stressed at work, does not like managing team and feels targeted. Discussed counseling and medication, it may be the cause of elevated bp, but he is not interested at this time   Vitamin D deficiency: he has been taking otc vitamin D now, he prefers the prescription due to cost , he will have blood work done today    Hyperlipidemia:he cannot take Crestor - it causes muscle pain, we will try Zetia again, once he said it caused diarrhea but we will try once more   Hyperglycemia: he has pre diabetes, discussed low carbohydrate diet   Migraine:  He describes migraine episodes  as throbbing, frontal, no phonophobia but has phonophobia. No nausea or vomiting. He states Imitrex spiked bp, he is now taking Ubrelvy at most twice a week and works well for him    Neurogenic bladder/recurrent UTI:/spina bifida: seeing Urologist - Dr. Liliane Shi at [facility]  , he takes cephalexin prn only.  He states that symptoms usually consists of strong odor but no problems at this time,  still self cath at least 3 times per day.  Gait is stable , he wears foot braces to assist with his gait . He goes to Ortho at [facility], he needs new braces, advised to contact Dr. Payton Mccallum   Dry cough: he states going on for the past week, not productive, worse at night, did not have cold symptoms, no chills or fevers, just annoying . He  thinks secondary to crestor, but he is on second bottle and explained unlikely the reason. 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) 01/01/2016   Hypertension, benign 01/01/2016   Neurogenic bladder 12/19/2009   Migraine without aura and without status migrainosus, not intractable 11/09/2009   Chronic urinary tract infection 08/27/2007    Past Surgical History:  Procedure Laterality Date   FEMUR SURGERY     broken bone   HIP SURGERY     SPINAL FIXATION SURGERY     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, reportedly non-functional for years    Family History  Problem Relation Age of Onset   Hyperlipidemia Mother    Hypertension Mother    Kidney cancer Maternal Grandmother    Kidney disease Neg Hx    Prostate cancer Neg Hx     Social History   Tobacco Use   Smoking status: Never   Smokeless tobacco: Never   Tobacco comments:    smoking cessation materials not required  Substance Use  Topics   Alcohol use: No    Alcohol/week: 0.0 standard drinks of alcohol     Current Outpatient Medications:    amLODipine (NORVASC) 2.5 MG tablet, Take 1 tablet (2.5 mg total) by mouth daily., Disp: 90 tablet, Rfl: 0   benzonatate (TESSALON) 100 MG capsule, Take 1-2 capsules (100-200 mg total) by mouth 2 (two) times daily as needed., Disp: 40 capsule, Rfl: 0   cephALEXin (KEFLEX) 500 MG capsule, Take 500 mg by mouth 4 (four) times daily as needed., Disp: , Rfl:    ibuprofen (ADVIL) 200 MG tablet, Take 200 mg by mouth every 6 (six) hours as needed for mild pain., Disp: , Rfl:    rosuvastatin (CRESTOR) 10 MG tablet, Take 1 tablet (10 mg total) by mouth daily., Disp: 90 tablet, Rfl: 0   UBRELVY 100 MG TABS, TAKE 1  TABLET BY MOUTH EVERY OTHER DAY AS NEEDED (Patient taking differently: Take 1 tablet by mouth every other day as needed (For migraine).), Disp: 15 tablet, Rfl: 2   lisinopril-hydrochlorothiazide (ZESTORETIC) 20-25 MG tablet, Take 1 tablet by mouth daily., Disp: 90 tablet, Rfl: 0   metoprolol succinate (TOPROL-XL) 50 MG 24 hr tablet, Take 1 tablet (50 mg total) by mouth daily. Take with or immediately following a meal., Disp: 90 tablet, Rfl: 0   Vitamin D, Ergocalciferol, (DRISDOL) 1.25 MG (50000 UNIT) CAPS capsule, Take 1 capsule (50,000 Units total) by mouth every 7 (seven) days. (Patient not taking: Reported on 02/09/2023), Disp: 12 capsule, Rfl: 1  Allergies  Allergen Reactions   Augmentin [Amoxicillin-Pot Clavulanate]     GI distress    I personally reviewed active problem list, medication list, allergies, family history with the patient/caregiver today.   ROS  Ten systems reviewed and is negative except as mentioned in HPI   Objective  Vitals:   04/14/23 1044  BP: (!) 150/100  Pulse: 94  Resp: 16  Temp: 98.4 F (36.9 C)  TempSrc: Oral  SpO2: 95%  Weight: 160 lb (72.6 kg)  Height: 4\' 11"  (1.499 m)    Body mass index is 32.32 kg/m.  Physical Exam  Constitutional: Patient appears well-developed and well-nourished. No distress.  HEENT: head atraumatic, normocephalic, pupils equal and reactive to light, neck supple, throat within normal limits Cardiovascular: Normal rate, regular rhythm and normal heart sounds.  No murmur heard. No BLE edema. Pulmonary/Chest: Effort normal and breath sounds normal. No respiratory distress. Abdominal: Soft.  There is no tenderness. Muscular skeletal: he wears braces on both lower extremities  Psychiatric: Patient has a normal mood and affect. behavior is normal. Judgment and thought content normal.   PHQ2/9:    04/14/2023   10:47 AM 11/12/2022    9:09 AM 08/07/2022    9:53 AM 08/07/2022    9:50 AM 04/15/2022   11:18 AM  Depression screen  PHQ 2/9  Decreased Interest 0 0 0 0 0  Down, Depressed, Hopeless 0 0 0 0 0  PHQ - 2 Score 0 0 0 0 0  Altered sleeping 0 0 0    Tired, decreased energy 0 0 0    Change in appetite 0 0 0    Feeling bad or failure about yourself  0 0 0    Trouble concentrating 0 0 1    Moving slowly or fidgety/restless 0 0 0    Suicidal thoughts 0 0 0    PHQ-9 Score 0 0 1    Difficult doing work/chores   Not difficult at all  phq 9 is negative   Fall Risk:    04/14/2023   10:47 AM 11/12/2022    9:09 AM 08/07/2022    9:52 AM 04/15/2022   11:17 AM 01/13/2022   11:33 AM  Fall Risk   Falls in the past year? 1 0 1 0 0  Number falls in past yr: 0 0 0 0 0  Injury with Fall? 1 0 0 0 0  Risk for fall due to : History of fall(s);Impaired balance/gait No Fall Risks History of fall(s) No Fall Risks No Fall Risks  Follow up Falls prevention discussed;Education provided;Falls evaluation completed Falls prevention discussed Education provided;Falls prevention discussed Falls prevention discussed Falls prevention discussed     Assessment & Plan  1. Hypertension, benign  - lisinopril-hydrochlorothiazide (ZESTORETIC) 20-25 MG tablet; Take 1 tablet by mouth daily.  Dispense: 90 tablet; Refill: 0 - metoprolol succinate (TOPROL-XL) 50 MG 24 hr tablet; Take 1 tablet (50 mg total) by mouth daily. Take with or immediately following a meal.  Dispense: 90 tablet; Refill: 0  2. 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 Migraine without aura and without status migrainosus, not intractable  - metoprolol succinate (TOPROL-XL) 50 MG 24 hr tablet; Take 1 tablet (50 mg total) by mouth daily. Take with or immediately following a meal.  Dispense: 90 tablet; Refill: 0  4. Familial hyperlipidemia  He is back on Crestor, states tolerating it well, did not want to take Zetia   5. Gait disturbance  stable  6. Self-catheterizes urinary bladder  No recent UTI symptoms   7. Vitamin D deficiency  Rx of  vitamin D at pharmacy   8. Neurogenic bladder   9. Recurrent UTI  Urologist gives him Keflex to take prn

## 2023-04-14 ENCOUNTER — Encounter: Payer: Self-pay | Admitting: Family Medicine

## 2023-04-14 ENCOUNTER — Ambulatory Visit (INDEPENDENT_AMBULATORY_CARE_PROVIDER_SITE_OTHER): Payer: Medicare Other | Admitting: Family Medicine

## 2023-04-14 VITALS — BP 150/100 | HR 94 | Temp 98.4°F | Resp 16 | Ht 59.0 in | Wt 160.0 lb

## 2023-04-14 DIAGNOSIS — Z789 Other specified health status: Secondary | ICD-10-CM | POA: Diagnosis not present

## 2023-04-14 DIAGNOSIS — N390 Urinary tract infection, site not specified: Secondary | ICD-10-CM | POA: Diagnosis not present

## 2023-04-14 DIAGNOSIS — R269 Unspecified abnormalities of gait and mobility: Secondary | ICD-10-CM

## 2023-04-14 DIAGNOSIS — R058 Other specified cough: Secondary | ICD-10-CM | POA: Diagnosis not present

## 2023-04-14 DIAGNOSIS — E7849 Other hyperlipidemia: Secondary | ICD-10-CM

## 2023-04-14 DIAGNOSIS — Q057 Lumbar spina bifida without hydrocephalus: Secondary | ICD-10-CM

## 2023-04-14 DIAGNOSIS — I10 Essential (primary) hypertension: Secondary | ICD-10-CM | POA: Diagnosis not present

## 2023-04-14 DIAGNOSIS — G43009 Migraine without aura, not intractable, without status migrainosus: Secondary | ICD-10-CM | POA: Diagnosis not present

## 2023-04-14 DIAGNOSIS — N319 Neuromuscular dysfunction of bladder, unspecified: Secondary | ICD-10-CM

## 2023-04-14 DIAGNOSIS — E559 Vitamin D deficiency, unspecified: Secondary | ICD-10-CM | POA: Diagnosis not present

## 2023-04-14 DIAGNOSIS — Z1211 Encounter for screening for malignant neoplasm of colon: Secondary | ICD-10-CM

## 2023-04-14 MED ORDER — ROSUVASTATIN CALCIUM 10 MG PO TABS
10.0000 mg | ORAL_TABLET | Freq: Every day | ORAL | 0 refills | Status: DC
Start: 2023-04-14 — End: 2023-07-15

## 2023-04-14 MED ORDER — LISINOPRIL-HYDROCHLOROTHIAZIDE 20-25 MG PO TABS
1.0000 | ORAL_TABLET | Freq: Every day | ORAL | 0 refills | Status: DC
Start: 2023-04-14 — End: 2023-07-15

## 2023-04-14 MED ORDER — METOPROLOL SUCCINATE ER 50 MG PO TB24: 50.0000 mg | ORAL_TABLET | Freq: Every day | ORAL | 0 refills | Status: DC

## 2023-04-14 MED ORDER — BENZONATATE 100 MG PO CAPS: 100.0000 mg | ORAL_CAPSULE | Freq: Two times a day (BID) | ORAL | 0 refills | Status: DC | PRN

## 2023-04-14 MED ORDER — AMLODIPINE BESYLATE 2.5 MG PO TABS: 2.5000 mg | ORAL_TABLET | Freq: Every day | ORAL | 0 refills | Status: DC

## 2023-04-14 NOTE — Patient Instructions (Signed)
Contact Dr. Payton Mccallum at [facility]  to replace your braces

## 2023-04-22 ENCOUNTER — Other Ambulatory Visit: Payer: Self-pay | Admitting: Family Medicine

## 2023-04-22 DIAGNOSIS — E7849 Other hyperlipidemia: Secondary | ICD-10-CM

## 2023-04-28 ENCOUNTER — Ambulatory Visit: Payer: Medicare Other

## 2023-04-28 VITALS — BP 138/86

## 2023-04-28 DIAGNOSIS — Z0130 Encounter for examination of blood pressure without abnormal findings: Secondary | ICD-10-CM

## 2023-07-14 NOTE — Progress Notes (Unsigned)
Name: Juan Hodges   MRN: [id number]    DOB: 1976-02-22   Date:07/15/2023       Progress Note  Subjective  Chief Complaint  Follow Up  HPI  HTN: he is now on Lisinopril/hctz and metoprolol, we discussed stopping HCTZ due to urinary frequency but he decided to stay on the current regiment since it has been working well for him, he has been compliant with medication for the past month and bp is at goal. We gave him norvasc last visit but he never started medication. Marland KitchenNo chest pain, palpitation or SOB   Stress: he continues to feel stressed at work but he is now in a different department and seems to have improved a little   Familial Hyperlipidemia: he has been taking Crestor again and denies muscle aches, except right thigh    Hyperglycemia: he has pre diabetes and we reminded him to follow a low carbohydrate diet    Migraine:  He describes migraine episodes  as throbbing, frontal, no phonophobia but has phonophobia. No nausea or vomiting. He states Imitrex spiked bp, he is now taking Ubrelvy prn and is doing well at this time    Neurogenic bladder/recurrent UTI:/spina bifida: seeing Urologist - Dr. Liliane Shi at [facility]  , he takes cephalexin prn only.  He states that symptoms usually consists of strong odor but no problems at this time,  still self cath at least 3 times per day.  He wears foot braces to assist with his gait . He goes to Ortho at [facility], but has noticed increase of right thigh pain , worse since a fall at work, X-ray showed mild OA right hip. He states pain is present every time he walks. He would like to see someone locally for right thigh pain.     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, reportedly non-functional for years    Family History  Problem Relation Age of Onset   Hyperlipidemia Mother    Hypertension Mother    Kidney cancer Maternal Grandmother    Kidney disease Neg Hx    Prostate cancer Neg Hx     Social History   Tobacco Use   Smoking status: Never   Smokeless tobacco: Never   Tobacco comments:    smoking cessation materials not required  Substance Use Topics   Alcohol use: No    Alcohol/week: 0.0 standard drinks of alcohol     Current Outpatient Medications:    ibuprofen (ADVIL) 200 MG tablet, Take 200 mg by mouth every 6 (six) hours as needed for mild pain., Disp: , Rfl:    lisinopril-hydrochlorothiazide (ZESTORETIC) 20-25 MG tablet, Take 1 tablet by mouth daily., Disp: 90 tablet, Rfl: 0   metoprolol succinate (TOPROL-XL) 50 MG 24 hr tablet, Take 1 tablet (50 mg total) by mouth daily. Take with or immediately following a meal., Disp:  90 tablet, Rfl: 0   rosuvastatin (CRESTOR) 10 MG tablet, Take 1 tablet (10 mg total) by mouth daily., Disp: 90 tablet, Rfl: 0   UBRELVY 100 MG TABS, TAKE 1 TABLET BY MOUTH EVERY OTHER DAY AS NEEDED (Patient taking differently: Take 1 tablet by mouth every other day as needed (For migraine).), Disp: 15 tablet, Rfl: 2   amLODipine (NORVASC) 2.5 MG tablet, Take 1 tablet (2.5 mg total) by mouth daily. (Patient not taking: Reported on 07/15/2023), Disp: 90 tablet, Rfl: 0   benzonatate (TESSALON) 100 MG capsule, Take 1-2 capsules (100-200 mg total) by mouth 2 (two) times daily as needed. (Patient not taking: Reported on 07/15/2023), Disp: 40 capsule, Rfl: 0   cephALEXin (KEFLEX) 500 MG capsule, Take 500 mg by  mouth 4 (four) times daily as needed. (Patient not taking: Reported on 07/15/2023), Disp: , Rfl:    Vitamin D, Ergocalciferol, (DRISDOL) 1.25 MG (50000 UNIT) CAPS capsule, Take 1 capsule (50,000 Units total) by mouth every 7 (seven) days. (Patient not taking: Reported on 02/09/2023), Disp: 12 capsule, Rfl: 1  Allergies  Allergen Reactions   Augmentin [Amoxicillin-Pot Clavulanate]     GI distress    I personally reviewed active problem list, medication list, allergies, family history, social history, health maintenance with the patient/caregiver today.   ROS  Ten systems reviewed and is negative except as mentioned in HPI    Objective  Vitals:   07/15/23 1115  BP: 132/84  Pulse: 95  Resp: 14  Temp: 98.1 F (36.7 C)  TempSrc: Oral  SpO2: 97%  Weight: 159 lb 14.4 oz (72.5 kg)  Height: 4\' 11"  (1.499 m)    Body mass index is 32.3 kg/m.  Physical Exam  Constitutional: Patient appears well-developed and well-nourished. Obese  No distress.  HEENT: head atraumatic, normocephalic, pupils equal and reactive to light, neck supple Cardiovascular: Normal rate, regular rhythm and normal heart sounds.  No murmur heard. No BLE edema. Pulmonary/Chest: Effort normal and breath sounds normal. No respiratory distress. Abdominal: Soft.  There is no tenderness. Muscular skeletal: wears braces on both feet, and spastic gait  Psychiatric: Patient has a normal mood and affect. behavior is normal. Judgment and thought content normal.   PHQ2/9:    07/15/2023   11:17 AM 04/14/2023   10:47 AM 11/12/2022    9:09 AM 08/07/2022    9:53 AM 08/07/2022    9:50 AM  Depression screen PHQ 2/9  Decreased Interest 0 0 0 0 0  Down, Depressed, Hopeless 0 0 0 0 0  PHQ - 2 Score 0 0 0 0 0  Altered sleeping 0 0 0 0   Tired, decreased energy 0 0 0 0   Change in appetite 0 0 0 0   Feeling bad or failure about yourself  0 0 0 0   Trouble concentrating 0 0 0 1   Moving slowly or fidgety/restless 0 0 0 0    Suicidal thoughts 0 0 0 0   PHQ-9 Score 0 0 0 1   Difficult doing work/chores    Not difficult at all     phq 9 is negative   Fall Risk:    07/15/2023   11:17 AM 04/14/2023   10:47 AM 11/12/2022    9:09 AM 08/07/2022    9:52 AM 04/15/2022   11:17 AM  Fall Risk   Falls in the past year? 0 1 0 1 0  Number falls in past yr:  0 0 0 0  Injury with  Fall?  1 0 0 0  Risk for fall due to : No Fall Risks History of fall(s);Impaired balance/gait No Fall Risks History of fall(s) No Fall Risks  Follow up Falls prevention discussed Falls prevention discussed;Education provided;Falls evaluation completed Falls prevention discussed Education provided;Falls prevention discussed Falls prevention discussed      Functional Status Survey: Is the patient deaf or have difficulty hearing?: No Does the patient have difficulty seeing, even when wearing glasses/contacts?: No Does the patient have difficulty concentrating, remembering, or making decisions?: No Does the patient have difficulty walking or climbing stairs?: No Does the patient have difficulty dressing or bathing?: No Does the patient have difficulty doing errands alone such as visiting a doctor's office or shopping?: Yes    Assessment & Plan  1. Hypertension, benign  - lisinopril-hydrochlorothiazide (ZESTORETIC) 20-25 MG tablet; Take 1 tablet by mouth daily.  Dispense: 90 tablet; Refill: 0 - metoprolol succinate (TOPROL-XL) 50 MG 24 hr tablet; Take 1 tablet (50 mg total) by mouth daily. Take with or immediately following a meal.  Dispense: 90 tablet; Refill: 0  2. Migraine without aura and without status migrainosus, not intractable  - metoprolol succinate (TOPROL-XL) 50 MG 24 hr tablet; Take 1 tablet (50 mg total) by mouth daily. Take with or immediately following a meal.  Dispense: 90 tablet; Refill: 0  3. Vitamin D deficiency  - Vitamin D, Ergocalciferol, (DRISDOL) 1.25 MG (50000 UNIT) CAPS capsule; Take 1 capsule (50,000 Units total)  by mouth every 7 (seven) days.  Dispense: 12 capsule; Refill: 1  4. Familial hyperlipidemia  - rosuvastatin (CRESTOR) 10 MG tablet; Take 1 tablet (10 mg total) by mouth daily.  Dispense: 90 tablet; Refill: 0  5. 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 Self-catheterizes urinary bladder   7. Gait disturbance  - Ambulatory referral to Orthopedic Surgery  8. Right thigh pain  - celecoxib (CELEBREX) 100 MG capsule; Take 1 capsule (100 mg total) by mouth 2 (two) times daily as needed. Joint pain, if tylenol does not work  Dispense: 60 capsule; Refill: 0 - Ambulatory referral to Orthopedic Surgery

## 2023-07-15 ENCOUNTER — Ambulatory Visit (INDEPENDENT_AMBULATORY_CARE_PROVIDER_SITE_OTHER): Payer: Medicare Other | Admitting: Family Medicine

## 2023-07-15 VITALS — BP 132/84 | HR 95 | Temp 98.1°F | Resp 14 | Ht 59.0 in | Wt 159.9 lb

## 2023-07-15 DIAGNOSIS — Z789 Other specified health status: Secondary | ICD-10-CM

## 2023-07-15 DIAGNOSIS — E7849 Other hyperlipidemia: Secondary | ICD-10-CM

## 2023-07-15 DIAGNOSIS — Q057 Lumbar spina bifida without hydrocephalus: Secondary | ICD-10-CM

## 2023-07-15 DIAGNOSIS — E559 Vitamin D deficiency, unspecified: Secondary | ICD-10-CM

## 2023-07-15 DIAGNOSIS — G43009 Migraine without aura, not intractable, without status migrainosus: Secondary | ICD-10-CM | POA: Diagnosis not present

## 2023-07-15 DIAGNOSIS — R269 Unspecified abnormalities of gait and mobility: Secondary | ICD-10-CM

## 2023-07-15 DIAGNOSIS — M79651 Pain in right thigh: Secondary | ICD-10-CM

## 2023-07-15 DIAGNOSIS — I10 Essential (primary) hypertension: Secondary | ICD-10-CM | POA: Diagnosis not present

## 2023-07-15 MED ORDER — CELECOXIB 100 MG PO CAPS
100.0000 mg | ORAL_CAPSULE | Freq: Two times a day (BID) | ORAL | 0 refills | Status: DC | PRN
Start: 2023-07-15 — End: 2024-03-15

## 2023-07-15 MED ORDER — ROSUVASTATIN CALCIUM 10 MG PO TABS
10.0000 mg | ORAL_TABLET | Freq: Every day | ORAL | 0 refills | Status: DC
Start: 2023-07-15 — End: 2023-11-12

## 2023-07-15 MED ORDER — LISINOPRIL-HYDROCHLOROTHIAZIDE 20-25 MG PO TABS: 1.0000 | ORAL_TABLET | Freq: Every day | ORAL | 0 refills | Status: DC

## 2023-07-15 MED ORDER — VITAMIN D (ERGOCALCIFEROL) 1.25 MG (50000 UNIT) PO CAPS
50000.0000 [IU] | ORAL_CAPSULE | ORAL | 1 refills | Status: DC
Start: 2023-07-15 — End: 2023-11-12

## 2023-07-15 MED ORDER — METOPROLOL SUCCINATE ER 50 MG PO TB24: 50.0000 mg | ORAL_TABLET | Freq: Every day | ORAL | 0 refills | Status: DC

## 2023-07-16 ENCOUNTER — Telehealth: Payer: Self-pay

## 2023-07-16 NOTE — Telephone Encounter (Signed)
Pt called asking about when his lab work was drawn last, glucose level. Pt concerned because the holidays are coming up and he doesn't want to affect his BS. Advised pt that thanks given is 1 day and can eat a piece of pie or a piece of cake. Pt asked about if flavored water is good and about Zero sugar soft drinks. Encouraged him to drink water and limits sodas to 1 or per day. Pt verbalized understanding.

## 2023-07-21 ENCOUNTER — Other Ambulatory Visit: Payer: Self-pay | Admitting: Family Medicine

## 2023-07-21 DIAGNOSIS — E7849 Other hyperlipidemia: Secondary | ICD-10-CM

## 2023-08-25 DIAGNOSIS — M12551 Traumatic arthropathy, right hip: Secondary | ICD-10-CM | POA: Diagnosis not present

## 2023-08-25 DIAGNOSIS — M25551 Pain in right hip: Secondary | ICD-10-CM | POA: Diagnosis not present

## 2023-08-25 DIAGNOSIS — M87051 Idiopathic aseptic necrosis of right femur: Secondary | ICD-10-CM | POA: Diagnosis not present

## 2023-08-27 DIAGNOSIS — M12551 Traumatic arthropathy, right hip: Secondary | ICD-10-CM | POA: Diagnosis not present

## 2023-08-27 DIAGNOSIS — M87051 Idiopathic aseptic necrosis of right femur: Secondary | ICD-10-CM | POA: Diagnosis not present

## 2023-09-08 ENCOUNTER — Ambulatory Visit: Payer: Medicare Other | Admitting: Family Medicine

## 2023-09-09 ENCOUNTER — Ambulatory Visit
Admission: EM | Admit: 2023-09-09 | Discharge: 2023-09-09 | Disposition: A | Discharge status: Home / Self Care | Payer: Medicare Other | Attending: Family Medicine | Admitting: Family Medicine

## 2023-09-09 DIAGNOSIS — U071 COVID-19: Secondary | ICD-10-CM | POA: Diagnosis not present

## 2023-09-09 HISTORY — DX: Unspecified osteoarthritis, unspecified site: M19.90

## 2023-09-09 LAB — SARS CORONAVIRUS 2 BY RT PCR: SARS Coronavirus 2 by RT PCR: POSITIVE — AB

## 2023-09-09 MED ORDER — BENZONATATE 100 MG PO CAPS: 100.0000 mg | ORAL_CAPSULE | Freq: Three times a day (TID) | ORAL | 0 refills | Status: DC

## 2023-09-09 MED ORDER — PROMETHAZINE-DM 6.25-15 MG/5ML PO SYRP: 5.0000 mL | ORAL_SOLUTION | Freq: Four times a day (QID) | ORAL | 0 refills | Status: DC | PRN

## 2023-09-09 MED ORDER — PAXLOVID (300/100) 20 X 150 MG & 10 X 100MG PO TBPK: ORAL_TABLET | ORAL | 0 refills | Status: DC

## 2023-09-09 NOTE — Discharge Instructions (Addendum)
 Your test for COVID-19 was positive, meaning that you were infected with the novel coronavirus and could give the germ to others.  The recommendations suggest returning to normal activities when, for at least 24 hours, symptoms are improving overall, and if a fever was present, it has been gone without use of a fever-reducing medication.  You should wear a mask for the next 5 days to prevent the spread of disease. Please continue good preventive care measures, including:  frequent hand-washing, avoid touching your face, cover coughs/sneezes, stay out of crowds and keep a 6 foot distance from others.  Go to the nearest hospital emergency room if fever/cough/breathlessness are severe or illness seems like a threat to life.  If your were prescribed medication. Stop by the pharmacy to pick it up. You can take Tylenol and/or Ibuprofen  as needed for fever reduction and pain relief.    For cough: Pick up your prescription cough medications.  You can use a humidifier for chest congestion and cough.  If you don't have a humidifier, you can sit in the bathroom with the hot shower running.      For sore throat: try warm salt water gargles, Mucinex sore throat cough drops or cepacol lozenges, throat spray, warm tea or water with lemon/honey, popsicles or ice, or OTC cold relief medicine for throat discomfort. You can also purchase chloraseptic spray at the pharmacy or dollar store.   For congestion: take a daily anti-histamine like Zyrtec, Claritin, and a oral decongestant, such as pseudoephedrine.  You can also use Flonase 1-2 sprays in each nostril daily. Afrin is also a good option, if you do not have high blood pressure.    It is important to stay hydrated: drink plenty of fluids (water, gatorade/powerade/pedialyte, juices, or teas) to keep your throat moisturized and help further relieve irritation/discomfort.    Return or go to the Emergency Department if symptoms worsen or do not improve in the next few  days

## 2023-09-09 NOTE — ED Provider Notes (Addendum)
 [facility]    CSN: [id number] Arrival date & time: 09/09/23  0910      History   Chief Complaint Chief Complaint  Patient presents with   Cough   Covid Exposure    HPI YASHAS CAMILLI is a 48 y.o. male.   HPI  History obtained from the patient and his mom Fynn presents for dry cough for the past 2 days. No fever, sore throat, ear pain, vomiting, diarrhea or shortness of breath. Endorses headache. Used some Tessalon  Perles, prescription cough medication and Tylenol.  Mom and dad have COVID.   No asthma. Denies smoking and vaping.    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 Authorizing Provider  benzonatate  (TESSALON ) 100 MG capsule Take 1 capsule (100 mg total) by mouth every 8 (eight) hours. 09/09/23  Yes Rylynn Schoneman, DO  lisinopril -hydrochlorothiazide  (ZESTORETIC ) 20-25 MG tablet Take 1 tablet by mouth daily. 07/15/23  Yes Sowles, Krichna, MD  metoprolol  succinate (TOPROL -XL) 50 MG 24 hr tablet Take 1 tablet (50 mg total) by mouth daily. Take with  or immediately following a meal. 07/15/23  Yes Sowles, Krichna, MD  nirmatrelvir/ritonavir (PAXLOVID , 300/100,) 20 x 150 MG & 10 x 100MG  TBPK Patient GFR is 95. Take nirmatrelvir (150 mg) two tablets twice daily for 5 days and ritonavir (100 mg) one tablet twice daily for 5 days. 09/09/23  Yes Adrean Findlay, DO  promethazine -dextromethorphan (PROMETHAZINE -DM) 6.25-15 MG/5ML syrup Take 5 mLs by mouth 4 (four) times daily as needed. 09/09/23  Yes Parrish Bonn, DO  rosuvastatin  (CRESTOR ) 10 MG tablet Take 1 tablet (10 mg total) by mouth daily. 07/15/23  Yes Sowles, Krichna, MD  Vitamin D , Ergocalciferol , (DRISDOL ) 1.25 MG (50000 UNIT) CAPS capsule Take 1 capsule (50,000 Units total) by mouth every 7 (seven) days. 07/15/23  Yes Sowles, Krichna, MD  celecoxib  (CELEBREX ) 100 MG capsule Take 1 capsule (100 mg total) by mouth 2 (two) times daily as needed. Joint pain, if tylenol does not work 07/15/23   Sowles, Krichna, MD  cephALEXin (KEFLEX) 500 MG capsule Take 500 mg by mouth 4 (four) times daily as needed. Patient not taking: Reported on 07/15/2023    Devere Lonni Righter, MD  UBRELVY  100 MG TABS TAKE 1 TABLET BY MOUTH EVERY OTHER DAY AS NEEDED Patient taking differently: Take 1 tablet by mouth every other day as needed (For migraine). 02/08/23   Sowles, Krichna, MD    Family History Family History  Problem  Relation Age of Onset   Hyperlipidemia Mother    Hypertension Mother    Kidney cancer Maternal Grandmother    Kidney disease Neg Hx    Prostate cancer Neg Hx     Social History Social History   Tobacco Use   Smoking status: Never   Smokeless tobacco: Never   Tobacco comments:    smoking cessation materials not required  Vaping Use   Vaping status: Never Used  Substance Use Topics   Alcohol use: No    Alcohol/week: 0.0 standard drinks of alcohol   Drug use: No     Allergies   Augmentin  [amoxicillin -pot clavulanate]   Review of Systems Review of Systems: negative unless  otherwise stated in HPI.      Physical Exam Triage Vital Signs ED Triage Vitals [09/09/23 0957]  Encounter Vitals Group     BP      Systolic BP Percentile      Diastolic BP Percentile      Pulse      Resp      Temp      Temp src      SpO2      Weight      Height      Head Circumference      Peak Flow      Pain Score 0     Pain Loc      Pain Education      Exclude from Growth Chart    No data found.  Updated Vital Signs BP (!) 138/95 (BP Location: Right Arm)   Pulse 99   Temp 98.7 F (37.1 C) (Oral)   Resp 18   SpO2 96%   Visual Acuity Right Eye Distance:   Left Eye Distance:   Bilateral Distance:    Right Eye Near:   Left Eye Near:    Bilateral Near:     Physical Exam GEN:     alert, well  appearing male in no distress    EYES:   no scleral injection or discharge RESP:  no increased work of breathing, clear to auscultation bilaterally CVS:   regular rate and rhythm Skin:   warm and dry    UC Treatments / Results  Labs (all labs ordered are listed, but only abnormal results are displayed) Labs Reviewed  SARS CORONAVIRUS 2 BY RT PCR - Abnormal; Notable for the following components:      Result Value   SARS Coronavirus 2 by RT PCR POSITIVE (*)    All other components within normal limits    EKG   Radiology No results found.  Procedures Procedures (including critical care time)  Medications Ordered in UC Medications - No data to display  Initial Impression / Assessment and Plan / UC Course  I have reviewed the triage vital signs and the nursing notes.  Pertinent labs & imaging results that were available during my care of the patient were reviewed by me and considered in my medical decision making (see chart for details).       Pt is a 48 y.o. male who presents for 1-2 days of respiratory symptoms. Datrell is afebrile here. Satting well on room air. Overall pt is well appearing, well hydrated, without respiratory distress. Pulmonary exam is  unremarkable.  COVID testing obtained and is positive like her parents. Discussed symptomatic treatment.  Tessalon  Perles promethazine  for cough.  After shared decision making patient would like to be prescribed Paxlovid .  Risk and benefits of this medication discussed.  Typical duration of symptoms discussed.   Return and ED precautions given and voiced understanding. Discussed MDM, treatment plan and plan for follow-up with patient who agrees with plan.     Final Clinical Impressions(s) / UC Diagnoses   Final diagnoses:  COVID-19     Discharge Instructions      Your test for COVID-19 was positive, meaning that you were infected with the novel coronavirus and could give the germ to others.  The recommendations suggest returning to normal activities when, for at least 24 hours, symptoms are improving overall, and if a fever was present, it has been gone without use of a fever-reducing medication.  You should wear a mask for the next 5 days to prevent the spread of disease. Please continue good preventive care measures, including:  frequent hand-washing, avoid touching your face, cover coughs/sneezes, stay out of crowds and keep a 6 foot distance from others.  Go to the nearest hospital emergency room if fever/cough/breathlessness are severe or illness seems like a threat to life.  If your were prescribed medication. Stop by the pharmacy to pick it up. You can take Tylenol and/or Ibuprofen  as needed for fever reduction and pain relief.    For cough: Pick up your prescription cough medications.  You can use a humidifier for chest congestion and cough.  If you don't have a humidifier, you can sit in the bathroom with the hot shower running.      For sore throat: try warm salt water gargles, Mucinex sore throat cough drops or cepacol lozenges, throat spray, warm tea or water with lemon/honey, popsicles or ice, or OTC cold relief medicine for throat discomfort. You can also purchase chloraseptic  spray at the pharmacy or dollar store.   For congestion: take a daily anti-histamine like Zyrtec, Claritin, and a oral decongestant, such as pseudoephedrine.  You can also use Flonase 1-2 sprays in each nostril daily. Afrin is also a good option, if you do not have high blood pressure.    It is important to stay hydrated: drink plenty of fluids (water, gatorade/powerade/pedialyte, juices, or teas) to keep your throat moisturized and help further relieve irritation/discomfort.    Return or go to the Emergency Department if symptoms worsen or do not improve in the next few days      ED Prescriptions     Medication Sig Dispense Auth. Provider   promethazine -dextromethorphan (PROMETHAZINE -DM) 6.25-15 MG/5ML syrup Take 5 mLs by mouth 4 (four) times daily as needed. 118 mL Lupita Rosales, DO   benzonatate  (TESSALON ) 100 MG capsule Take 1 capsule (100 mg total) by mouth every 8 (eight) hours. 21 capsule Crytal Pensinger, DO   nirmatrelvir/ritonavir (PAXLOVID , 300/100,) 20 x 150 MG & 10 x 100MG  TBPK Patient GFR is 95. Take nirmatrelvir (150 mg) two tablets twice daily for 5 days and ritonavir (100 mg) one tablet twice daily for 5 days. 30 tablet Shavy Beachem, DO      PDMP not reviewed this encounter.      Kriste Berth, DO 09/09/23 1222

## 2023-09-09 NOTE — ED Triage Notes (Signed)
 Pt states he was exposed to Covid 3 days ago and would like to get a Covid test done today.   Pt states he has a cough that started yesterday.

## 2023-09-25 ENCOUNTER — Ambulatory Visit: Payer: Medicare Other | Admitting: Family Medicine

## 2023-09-29 ENCOUNTER — Ambulatory Visit: Payer: Medicare Other | Admitting: Family Medicine

## 2023-09-29 VITALS — BP 134/82 | HR 108 | Resp 16 | Ht 59.0 in | Wt 163.4 lb

## 2023-09-29 DIAGNOSIS — Z0000 Encounter for general adult medical examination without abnormal findings: Secondary | ICD-10-CM | POA: Diagnosis not present

## 2023-09-29 NOTE — Progress Notes (Deleted)
Subjective:   Juan Hodges is a 48 y.o. male who presents for Medicare Annual/Subsequent preventive examination.  Visit Complete: In person  Patient Medicare AWV questionnaire was completed by the patient on 09/29/2023; I have confirmed that all information answered by patient is correct and no changes since this date.  Cardiac Risk Factors include: hypertension;dyslipidemia     Objective:    Today's Vitals   09/29/23 1255  BP: 134/82  Pulse: (!) 108  Resp: 16  SpO2: 98%  Weight: 163 lb 6.4 oz (74.1 kg)  Height: 4\' 11"  (1.499 m)  PainSc: 3    Body mass index is 33 kg/m.     09/29/2023    1:06 PM 02/09/2023    9:22 PM 08/06/2021   10:16 AM 05/09/2021    6:06 AM 07/31/2020   10:12 AM 05/06/2019   11:08 AM 04/30/2018    1:52 PM  Advanced Directives  Does Patient Have a Medical Advance Directive? No No Yes No No No No  Type of Publishing rights manager of Healthcare Power of Attorney in Chart?   No - copy requested      Would patient like information on creating a medical advance directive? Yes (Inpatient - patient defers creating a medical advance directive at this time - Information given)   No - Patient declined No - Patient declined No - Patient declined Yes (MAU/Ambulatory/Procedural Areas - Information given)    Current Medications (verified) Outpatient Encounter Medications as of 09/29/2023  Medication Sig   benzonatate (TESSALON) 100 MG capsule Take 1 capsule (100 mg total) by mouth every 8 (eight) hours.   celecoxib (CELEBREX) 100 MG capsule Take 1 capsule (100 mg total) by mouth 2 (two) times daily as needed. Joint pain, if tylenol does not work   lisinopril-hydrochlorothiazide (ZESTORETIC) 20-25 MG tablet Take 1 tablet by mouth daily.   metoprolol succinate (TOPROL-XL) 50 MG 24 hr tablet Take 1 tablet (50 mg total) by mouth daily. Take with or immediately following a meal.   nirmatrelvir/ritonavir (PAXLOVID, 300/100,) 20 x 150 MG &  10 x 100MG  TBPK Patient GFR is 95. Take nirmatrelvir (150 mg) two tablets twice daily for 5 days and ritonavir (100 mg) one tablet twice daily for 5 days.   promethazine-dextromethorphan (PROMETHAZINE-DM) 6.25-15 MG/5ML syrup Take 5 mLs by mouth 4 (four) times daily as needed.   rosuvastatin (CRESTOR) 10 MG tablet Take 1 tablet (10 mg total) by mouth daily.   UBRELVY 100 MG TABS TAKE 1 TABLET BY MOUTH EVERY OTHER DAY AS NEEDED (Patient taking differently: Take 1 tablet by mouth every other day as needed (For migraine).)   Vitamin D, Ergocalciferol, (DRISDOL) 1.25 MG (50000 UNIT) CAPS capsule Take 1 capsule (50,000 Units total) by mouth every 7 (seven) days.   cephALEXin (KEFLEX) 500 MG capsule Take 500 mg by mouth 4 (four) times daily as needed. (Patient not taking: Reported on 09/29/2023)   No facility-administered encounter medications on file as of 09/29/2023.    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, reportedly non-functional for years   Family History  Problem Relation Age of Onset   Hyperlipidemia Mother    Hypertension Mother    Kidney cancer Maternal Grandmother    Kidney disease Neg Hx    Prostate cancer Neg Hx    Social History   Socioeconomic History   Marital status: Single    Spouse name: Not on file   Number of children: 0   Years of education: Not on file   Highest education level: 12th grade  Occupational History    Employer: [employer]  Tobacco Use   Smoking status: Never   Smokeless tobacco: Never   Tobacco comments:    smoking cessation materials not required  Vaping Use    Vaping status: Never Used  Substance and Sexual Activity   Alcohol use: No   Drug use: No   Sexual activity: Not Currently  Other Topics Concern   Not on file  Social History Narrative   Does not drive   Social Drivers of Health   Financial Resource Strain: Medium Risk (09/29/2023)   Overall Financial Resource Strain (CARDIA)    Difficulty of Paying Living Expenses: Somewhat hard  Food Insecurity: Patient Declined (09/29/2023)   Hunger Vital Sign    Worried About Running Out of Food in the Last Year: Patient declined    Ran Out of Food in the Last Year: Patient declined  Transportation Needs: No Transportation Needs (09/29/2023)   PRAPARE - Administrator, Civil Service (Medical): No    Lack of Transportation (Non-Medical): No  Physical Activity: Insufficiently Active (09/29/2023)   Exercise Vital Sign    Days of Exercise per Week: 4 days    Minutes of Exercise per Session: 30 min  Stress: No Stress Concern Present (09/29/2023)   [employer] of Occupational Health - Occupational Stress Questionnaire    Feeling of Stress : Not at all  Social Connections: Moderately Integrated (09/29/2023)   Social Connection and Isolation Panel [NHANES]    Frequency of Communication with Friends and Family: Once a week    Frequency of Social Gatherings with Friends and Family: Three times a week    Attends Religious Services: 1 to 4 times per year    Active Member of Clubs or Organizations: Yes    Attends Banker Meetings: 1 to 4 times per year    Marital Status: Never married    Tobacco Counseling Counseling given: Not Answered Tobacco comments: smoking cessation materials not required   Clinical Intake:  Pre-visit preparation completed: No  Pain : No/denies pain Pain Score: 3  migraine but improving with medication    Nutritional Risks: None Diabetes: No  How often do you need to have someone help you when you read instructions, pamphlets, or other  written materials from your doctor or pharmacy?: 3 - Sometimes What is the last grade level you completed in school?: 12th grade  Interpreter Needed?: No    Activities of Daily Living    09/29/2023   12:57 PM 07/15/2023   11:17 AM  In your present state of health, do you have any difficulty performing the following activities:  Hearing? 0 0  Vision? 0 0  Difficulty concentrating or making decisions? 0 0  Walking or climbing stairs? 0 0  Dressing or bathing? 0 0  Doing errands, shopping? 1 1  Preparing Food and eating ? Y   Using the Toilet? N   In the  past six months, have you accidently leaked urine? Y   Do you have problems with loss of bowel control? Y   Managing your Medications? N   Managing your Finances? N   Housekeeping or managing your Housekeeping? N     Patient Care Team: Alba Cory, MD as PCP - General (Family Medicine) Rene Paci, MD as Consulting Physician (Urology) Reinaldo Berber, MD as Consulting Physician (Orthopedic Surgery)  Indicate any recent Medical Services you may have received from other than [facility] providers in the past year (date may be approximate).     Assessment:   This is a routine wellness examination for [place].  Hearing/Vision screen  No problems    Goals Addressed   None    Depression Screen    09/29/2023    1:06 PM 07/15/2023   11:17 AM 04/14/2023   10:47 AM 11/12/2022    9:09 AM 08/07/2022    9:53 AM 08/07/2022    9:50 AM 04/15/2022   11:18 AM  PHQ 2/9 Scores  PHQ - 2 Score 0 0 0 0 0 0 0  PHQ- 9 Score  0 0 0 1      Fall Risk    09/29/2023    1:06 PM 07/15/2023   11:17 AM 04/14/2023   10:47 AM 11/12/2022    9:09 AM 08/07/2022    9:52 AM  Fall Risk   Falls in the past year? 0 0 1 0 1  Number falls in past yr: 0  0 0 0  Injury with Fall? 0  1 0 0  Risk for fall due to : No Fall Risks No Fall Risks History of fall(s);Impaired balance/gait No Fall Risks History of fall(s)  Follow up Falls prevention  discussed;Education provided;Falls evaluation completed Falls prevention discussed Falls prevention discussed;Education provided;Falls evaluation completed Falls prevention discussed Education provided;Falls prevention discussed    MEDICARE RISK AT HOME: Medicare Risk at Home Any stairs in or around the home?: Yes If so, are there any without handrails?: Yes Home free of loose throw rugs in walkways, pet beds, electrical cords, etc?: Yes Adequate lighting in your home to reduce risk of falls?: Yes Life alert?: No Use of a cane, walker or w/c?: No Grab bars in the bathroom?: Yes Shower chair or bench in shower?: No Elevated toilet seat or a handicapped toilet?: No  TIMED UP AND GO:  Was the test performed?  Yes  Length of time to ambulate 10 feet: 08 sec Gait slow and steady without use of assistive device    Cognitive Function:        09/29/2023    1:11 PM 08/07/2022    9:52 AM 05/06/2019   11:16 AM 04/30/2018    1:53 PM  6CIT Screen  What Year? 0 points 0 points 0 points 0 points  What month? 0 points 0 points 0 points 0 points  What time? 0 points 0 points 0 points 0 points  Count back from 20 0 points  0 points 0 points  Months in reverse -- 0 points 0 points 2 points  Repeat phrase -- 0 points 2 points 2 points  Total Score   2 points 4 points   Patient did not want to finish the test  Immunizations Immunization History  Administered Date(s) Administered   PFIZER(Purple Top)SARS-COV-2 Vaccination 02/08/2020, 02/29/2020   Pneumococcal Polysaccharide-23 06/19/2021    TDAP status: Due, Education has been provided regarding the importance of this vaccine. Advised may receive this vaccine at local  pharmacy or Health Dept. Aware to provide a copy of the vaccination record if obtained from local pharmacy or Health Dept. Verbalized acceptance and understanding.  Flu Vaccine status: Declined, Education has been provided regarding the importance of this vaccine but patient  still declined. Advised may receive this vaccine at local pharmacy or Health Dept. Aware to provide a copy of the vaccination record if obtained from local pharmacy or Health Dept. Verbalized acceptance and understanding.  Pneumococcal vaccine status: Declined,  Education has been provided regarding the importance of this vaccine but patient still declined. Advised may receive this vaccine at local pharmacy or Health Dept. Aware to provide a copy of the vaccination record if obtained from local pharmacy or Health Dept. Verbalized acceptance and understanding.   Covid-19 vaccine status: Information provided on how to obtain vaccines.  Completed only 2 doses  Qualifies for Shingles Vaccine? No   Zostavax completed No   Shingrix Completed?: No.    Education has been provided regarding the importance of this vaccine. Patient has been advised to call insurance company to determine out of pocket expense if they have not yet received this vaccine. Advised may also receive vaccine at local pharmacy or Health Dept. Verbalized acceptance and understanding.  Screening Tests Health Maintenance  Topic Date Due   COVID-19 Vaccine (3 - 2024-25 season) 10/15/2023 (Originally 05/10/2023)   INFLUENZA VACCINE  12/07/2023 (Originally 04/09/2023)   DTaP/Tdap/Td (1 - Tdap) 09/28/2024 (Originally 07/30/1994)   Colonoscopy  09/28/2024 (Originally 07/30/2020)   Medicare Annual Wellness (AWV)  09/28/2024   Hepatitis C Screening  Completed   HIV Screening  Completed   Pneumococcal Vaccine 42-60 Years old  Aged Out   HPV VACCINES  Aged Out    Health Maintenance   Colon cancer screening: Denied Colonoscopy but will get cologuard done   Lung Cancer Screening: (Low Dose CT Chest recommended if Age 1-80 years, 20 pack-year currently smoking OR have quit w/in 15years.) does not qualify.   Lung Cancer Screening Referral: N/A  Additional Screening:  Hepatitis C Screening: ; Completed 06/15/2020  Vision Screening:  Recommended annual ophthalmology exams for early detection of glaucoma and other disorders of the eye. Is the patient up to date with their annual eye exam?  No  Who is the provider or what is the name of the office in which the patient attends annual eye exams? no If pt is not established with a provider, would they like to be referred to a provider to establish care?  He refused a referral  .   Dental Screening: Recommended annual dental exams for proper oral hygiene  Community Resource Referral / Chronic Care Management: CRR required this visit?   refused   CCM required this visit?  No     Plan:     I have personally reviewed and noted the following in the patient's chart:   Medical and social history Use of alcohol, tobacco or illicit drugs  Current medications and supplements including opioid prescriptions. Patient is not currently taking opioid prescriptions. Functional ability and status Nutritional status Physical activity Advanced directives List of other physicians Hospitalizations, surgeries, and ER visits in previous 12 months Vitals Screenings to include cognitive, depression, and falls Referrals and appointments  In addition, I have reviewed and discussed with patient certain preventive protocols, quality metrics, and best practice recommendations. A written personalized care plan for preventive services as well as general preventive health recommendations were provided to patient.   He states his goal is to improve  his diet - he is avoiding sugar and red meat and fried food   Ruel Favors, MD   09/29/2023   After Visit Summary: (In Person-Declined) Patient declined AVS at this time.

## 2023-09-29 NOTE — Progress Notes (Signed)
Subjective:   Juan Hodges is a 48 y.o. male who presents for Medicare Annual/Subsequent preventive examination.  Visit Complete: In person  Patient Medicare AWV questionnaire was completed by the patient on 09/29/2023; I have confirmed that all information answered by patient is correct and no changes since this date.  Cardiac Risk Factors include: hypertension;dyslipidemia     Objective:    Today's Vitals   09/29/23 1255  BP: 134/82  Pulse: (!) 108  Resp: 16  SpO2: 98%  Weight: 163 lb 6.4 oz (74.1 kg)  Height: 4\' 11"  (1.499 m)  PainSc: 3    Body mass index is 33 kg/m.     09/29/2023    1:06 PM 02/09/2023    9:22 PM 08/06/2021   10:16 AM 05/09/2021    6:06 AM 07/31/2020   10:12 AM 05/06/2019   11:08 AM 04/30/2018    1:52 PM  Advanced Directives  Does Patient Have a Medical Advance Directive? No No Yes No No No No  Type of Publishing rights manager of Healthcare Power of Attorney in Chart?   No - copy requested      Would patient like information on creating a medical advance directive? Yes (Inpatient - patient defers creating a medical advance directive at this time - Information given)   No - Patient declined No - Patient declined No - Patient declined Yes (MAU/Ambulatory/Procedural Areas - Information given)    Current Medications (verified) Outpatient Encounter Medications as of 09/29/2023  Medication Sig   benzonatate (TESSALON) 100 MG capsule Take 1 capsule (100 mg total) by mouth every 8 (eight) hours.   celecoxib (CELEBREX) 100 MG capsule Take 1 capsule (100 mg total) by mouth 2 (two) times daily as needed. Joint pain, if tylenol does not work   lisinopril-hydrochlorothiazide (ZESTORETIC) 20-25 MG tablet Take 1 tablet by mouth daily.   metoprolol succinate (TOPROL-XL) 50 MG 24 hr tablet Take 1 tablet (50 mg total) by mouth daily. Take with or immediately following a meal.   nirmatrelvir/ritonavir (PAXLOVID, 300/100,) 20 x 150 MG &  10 x 100MG  TBPK Patient GFR is 95. Take nirmatrelvir (150 mg) two tablets twice daily for 5 days and ritonavir (100 mg) one tablet twice daily for 5 days.   promethazine-dextromethorphan (PROMETHAZINE-DM) 6.25-15 MG/5ML syrup Take 5 mLs by mouth 4 (four) times daily as needed.   rosuvastatin (CRESTOR) 10 MG tablet Take 1 tablet (10 mg total) by mouth daily.   UBRELVY 100 MG TABS TAKE 1 TABLET BY MOUTH EVERY OTHER DAY AS NEEDED (Patient taking differently: Take 1 tablet by mouth every other day as needed (For migraine).)   Vitamin D, Ergocalciferol, (DRISDOL) 1.25 MG (50000 UNIT) CAPS capsule Take 1 capsule (50,000 Units total) by mouth every 7 (seven) days.   cephALEXin (KEFLEX) 500 MG capsule Take 500 mg by mouth 4 (four) times daily as needed. (Patient not taking: Reported on 09/29/2023)   No facility-administered encounter medications on file as of 09/29/2023.    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, reportedly non-functional for years   Family History  Problem Relation Age of Onset   Hyperlipidemia Mother    Hypertension Mother    Kidney cancer Maternal Grandmother    Kidney disease Neg Hx    Prostate cancer Neg Hx    Social History   Socioeconomic History   Marital status: Single    Spouse name: Not on file   Number of children: 0   Years of education: Not on file   Highest education level: 12th grade  Occupational History    Employer: [employer]  Tobacco Use   Smoking status: Never   Smokeless tobacco: Never   Tobacco comments:    smoking cessation materials not required  Vaping Use    Vaping status: Never Used  Substance and Sexual Activity   Alcohol use: No   Drug use: No   Sexual activity: Not Currently  Other Topics Concern   Not on file  Social History Narrative   Does not drive   Social Drivers of Health   Financial Resource Strain: Medium Risk (09/29/2023)   Overall Financial Resource Strain (CARDIA)    Difficulty of Paying Living Expenses: Somewhat hard  Food Insecurity: Patient Declined (09/29/2023)   Hunger Vital Sign    Worried About Running Out of Food in the Last Year: Patient declined    Ran Out of Food in the Last Year: Patient declined  Transportation Needs: No Transportation Needs (09/29/2023)   PRAPARE - Administrator, Civil Service (Medical): No    Lack of Transportation (Non-Medical): No  Physical Activity: Insufficiently Active (09/29/2023)   Exercise Vital Sign    Days of Exercise per Week: 4 days    Minutes of Exercise per Session: 30 min  Stress: No Stress Concern Present (09/29/2023)   [employer] of Occupational Health - Occupational Stress Questionnaire    Feeling of Stress : Not at all  Social Connections: Moderately Integrated (09/29/2023)   Social Connection and Isolation Panel [NHANES]    Frequency of Communication with Friends and Family: Once a week    Frequency of Social Gatherings with Friends and Family: Three times a week    Attends Religious Services: 1 to 4 times per year    Active Member of Clubs or Organizations: Yes    Attends Banker Meetings: 1 to 4 times per year    Marital Status: Never married    Tobacco Counseling Counseling given: Not Answered Tobacco comments: smoking cessation materials not required   Clinical Intake:  Pre-visit preparation completed: No  Pain : No/denies pain Pain Score: 3  migraine but improving with medication    Nutritional Risks: None Diabetes: No  How often do you need to have someone help you when you read instructions, pamphlets, or other  written materials from your doctor or pharmacy?: 3 - Sometimes What is the last grade level you completed in school?: 12th grade  Interpreter Needed?: No    Activities of Daily Living    09/29/2023   12:57 PM 07/15/2023   11:17 AM  In your present state of health, do you have any difficulty performing the following activities:  Hearing? 0 0  Vision? 0 0  Difficulty concentrating or making decisions? 0 0  Walking or climbing stairs? 0 0  Dressing or bathing? 0 0  Doing errands, shopping? 1 1  Preparing Food and eating ? Y   Using the Toilet? N   In the  past six months, have you accidently leaked urine? Y   Do you have problems with loss of bowel control? Y   Managing your Medications? N   Managing your Finances? N   Housekeeping or managing your Housekeeping? N     Patient Care Team: Alba Cory, MD as PCP - General (Family Medicine) Rene Paci, MD as Consulting Physician (Urology) Reinaldo Berber, MD as Consulting Physician (Orthopedic Surgery)  Indicate any recent Medical Services you may have received from other than [facility] providers in the past year (date may be approximate).     Assessment:   This is a routine wellness examination for [place].  Hearing/Vision screen  No problems    Goals Addressed   None    Depression Screen    09/29/2023    1:06 PM 07/15/2023   11:17 AM 04/14/2023   10:47 AM 11/12/2022    9:09 AM 08/07/2022    9:53 AM 08/07/2022    9:50 AM 04/15/2022   11:18 AM  PHQ 2/9 Scores  PHQ - 2 Score 0 0 0 0 0 0 0  PHQ- 9 Score  0 0 0 1      Fall Risk    09/29/2023    1:06 PM 07/15/2023   11:17 AM 04/14/2023   10:47 AM 11/12/2022    9:09 AM 08/07/2022    9:52 AM  Fall Risk   Falls in the past year? 0 0 1 0 1  Number falls in past yr: 0  0 0 0  Injury with Fall? 0  1 0 0  Risk for fall due to : No Fall Risks No Fall Risks History of fall(s);Impaired balance/gait No Fall Risks History of fall(s)  Follow up Falls prevention  discussed;Education provided;Falls evaluation completed Falls prevention discussed Falls prevention discussed;Education provided;Falls evaluation completed Falls prevention discussed Education provided;Falls prevention discussed    MEDICARE RISK AT HOME: Medicare Risk at Home Any stairs in or around the home?: Yes If so, are there any without handrails?: Yes Home free of loose throw rugs in walkways, pet beds, electrical cords, etc?: Yes Adequate lighting in your home to reduce risk of falls?: Yes Life alert?: No Use of a cane, walker or w/c?: No Grab bars in the bathroom?: Yes Shower chair or bench in shower?: No Elevated toilet seat or a handicapped toilet?: No  TIMED UP AND GO:  Was the test performed?  Yes  Length of time to ambulate 10 feet: 08 sec Gait slow and steady without use of assistive device    Cognitive Function:        09/29/2023    1:11 PM 08/07/2022    9:52 AM 05/06/2019   11:16 AM 04/30/2018    1:53 PM  6CIT Screen  What Year? 0 points 0 points 0 points 0 points  What month? 0 points 0 points 0 points 0 points  What time? 0 points 0 points 0 points 0 points  Count back from 20 0 points  0 points 0 points  Months in reverse -- 0 points 0 points 2 points  Repeat phrase -- 0 points 2 points 2 points  Total Score   2 points 4 points   Patient did not want to finish the test  Immunizations Immunization History  Administered Date(s) Administered   PFIZER(Purple Top)SARS-COV-2 Vaccination 02/08/2020, 02/29/2020   Pneumococcal Polysaccharide-23 06/19/2021    TDAP status: Due, Education has been provided regarding the importance of this vaccine. Advised may receive this vaccine at local  pharmacy or Health Dept. Aware to provide a copy of the vaccination record if obtained from local pharmacy or Health Dept. Verbalized acceptance and understanding.  Flu Vaccine status: Declined, Education has been provided regarding the importance of this vaccine but patient  still declined. Advised may receive this vaccine at local pharmacy or Health Dept. Aware to provide a copy of the vaccination record if obtained from local pharmacy or Health Dept. Verbalized acceptance and understanding.  Pneumococcal vaccine status: Declined,  Education has been provided regarding the importance of this vaccine but patient still declined. Advised may receive this vaccine at local pharmacy or Health Dept. Aware to provide a copy of the vaccination record if obtained from local pharmacy or Health Dept. Verbalized acceptance and understanding.   Covid-19 vaccine status: Information provided on how to obtain vaccines.  Completed only 2 doses  Qualifies for Shingles Vaccine? No   Zostavax completed No   Shingrix Completed?: No.    Education has been provided regarding the importance of this vaccine. Patient has been advised to call insurance company to determine out of pocket expense if they have not yet received this vaccine. Advised may also receive vaccine at local pharmacy or Health Dept. Verbalized acceptance and understanding.  Screening Tests Health Maintenance  Topic Date Due   COVID-19 Vaccine (3 - 2024-25 season) 10/15/2023 (Originally 05/10/2023)   INFLUENZA VACCINE  12/07/2023 (Originally 04/09/2023)   DTaP/Tdap/Td (1 - Tdap) 09/28/2024 (Originally 07/30/1994)   Colonoscopy  09/28/2024 (Originally 07/30/2020)   Medicare Annual Wellness (AWV)  09/28/2024   Hepatitis C Screening  Completed   HIV Screening  Completed   Pneumococcal Vaccine 47-75 Years old  Aged Out   HPV VACCINES  Aged Out    Health Maintenance   Colon cancer screening: Denied Colonoscopy but will get cologuard done   Lung Cancer Screening: (Low Dose CT Chest recommended if Age 41-80 years, 20 pack-year currently smoking OR have quit w/in 15years.) does not qualify.   Lung Cancer Screening Referral: N/A  Additional Screening:  Hepatitis C Screening: ; Completed 06/15/2020  Vision Screening:  Recommended annual ophthalmology exams for early detection of glaucoma and other disorders of the eye. Is the patient up to date with their annual eye exam?  No  Who is the provider or what is the name of the office in which the patient attends annual eye exams? no If pt is not established with a provider, would they like to be referred to a provider to establish care?  He refused a referral  .   Dental Screening: Recommended annual dental exams for proper oral hygiene  Community Resource Referral / Chronic Care Management: CRR required this visit?   refused   CCM required this visit?  No     Plan:     I have personally reviewed and noted the following in the patient's chart:   Medical and social history Use of alcohol, tobacco or illicit drugs  Current medications and supplements including opioid prescriptions. Patient is not currently taking opioid prescriptions. Functional ability and status Nutritional status Physical activity Advanced directives List of other physicians Hospitalizations, surgeries, and ER visits in previous 12 months Vitals Screenings to include cognitive, depression, and falls Referrals and appointments  In addition, I have reviewed and discussed with patient certain preventive protocols, quality metrics, and best practice recommendations. A written personalized care plan for preventive services as well as general preventive health recommendations were provided to patient.   He states his goal is to improve  his diet - he is avoiding sugar and red meat and fried food   Maricela  Renteria-Garcia, CMA   09/29/2023   After Visit Summary: (In Person-Declined) Patient declined AVS at this time.

## 2023-09-29 NOTE — Progress Notes (Deleted)
 Juan Hodges

## 2023-11-12 ENCOUNTER — Encounter: Payer: Self-pay | Admitting: Family Medicine

## 2023-11-12 ENCOUNTER — Ambulatory Visit (INDEPENDENT_AMBULATORY_CARE_PROVIDER_SITE_OTHER): Payer: Medicare Other | Admitting: Family Medicine

## 2023-11-12 ENCOUNTER — Ambulatory Visit: Payer: Medicare Other | Admitting: Family Medicine

## 2023-11-12 VITALS — BP 132/84 | HR 100 | Resp 16 | Ht 59.0 in | Wt 159.7 lb

## 2023-11-12 DIAGNOSIS — R739 Hyperglycemia, unspecified: Secondary | ICD-10-CM

## 2023-11-12 DIAGNOSIS — N319 Neuromuscular dysfunction of bladder, unspecified: Secondary | ICD-10-CM | POA: Diagnosis not present

## 2023-11-12 DIAGNOSIS — E7849 Other hyperlipidemia: Secondary | ICD-10-CM

## 2023-11-12 DIAGNOSIS — R269 Unspecified abnormalities of gait and mobility: Secondary | ICD-10-CM | POA: Diagnosis not present

## 2023-11-12 DIAGNOSIS — E559 Vitamin D deficiency, unspecified: Secondary | ICD-10-CM

## 2023-11-12 DIAGNOSIS — Q057 Lumbar spina bifida without hydrocephalus: Secondary | ICD-10-CM

## 2023-11-12 DIAGNOSIS — I10 Essential (primary) hypertension: Secondary | ICD-10-CM

## 2023-11-12 DIAGNOSIS — G43009 Migraine without aura, not intractable, without status migrainosus: Secondary | ICD-10-CM | POA: Diagnosis not present

## 2023-11-12 MED ORDER — VITAMIN D (ERGOCALCIFEROL) 1.25 MG (50000 UNIT) PO CAPS: 50000.0000 [IU] | ORAL_CAPSULE | ORAL | 1 refills | Status: DC

## 2023-11-12 MED ORDER — METOPROLOL SUCCINATE ER 50 MG PO TB24: 50.0000 mg | ORAL_TABLET | Freq: Every day | ORAL | 0 refills | Status: DC

## 2023-11-12 MED ORDER — ROSUVASTATIN CALCIUM 10 MG PO TABS: 10.0000 mg | ORAL_TABLET | Freq: Every day | ORAL | 0 refills | Status: DC

## 2023-11-12 MED ORDER — LISINOPRIL-HYDROCHLOROTHIAZIDE 20-25 MG PO TABS: 1.0000 | ORAL_TABLET | Freq: Every day | ORAL | 0 refills | Status: DC

## 2023-11-12 NOTE — Progress Notes (Signed)
 Name: Juan Hodges   MRN: [id number]    DOB: 04/29/1976   Date:11/12/2023       Progress Note  Subjective  Chief Complaint  Chief Complaint  Patient presents with   Medical Management of Chronic Issues   HPI   HTN: he is supposed to take  Lisinopril/hctz and metoprolol but states has not been compliant for the past 6 weeks, bp is elevated when he first arrived but improved before he left.  He denies chest pain but has intermittent palpitation. Discussed importance of taking medications daily to prevent long term complications of HTN, she states he will resume  taking metoprolol only and monitor    Stress: he continues to feel stressed at work but he is now in a different department and seems to have improved a little Stable   Familial Hyperlipidemia: he has been taking Crestor again and denies muscle aches, he is due for labs    Hyperglycemia: he has pre diabetes and we reminded him to follow a low carbohydrate diet    Migraine:  He describes migraine episodes  as throbbing, frontal, no phonophobia but has phonophobia. No nausea or vomiting. He states Imitrex spiked bp, he is now taking Ubrelvy prn and no side effects of medication   Neurogenic bladder/recurrent UTI:/spina bifida: seeing Urologist - Dr. Liliane Shi at [facility]  , he takes cephalexin prn only.  He states that symptoms usually consists of strong odor but no problems at this time,  still self cath at least 3 times per day.  He wears foot braces to assist with his gait .    Right hip post traumatic OA: he had a remote fracture right femur and since fall at work last year the pain has been worse. He was seen by Dr. Landry Mellow in Dec and had had steroid injection and improved symptoms for at least 2.5 months. 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) 01/01/2016   Hypertension, benign 01/01/2016   Neurogenic bladder 12/19/2009   Migraine without aura and without status migrainosus, not intractable 11/09/2009   Chronic urinary tract infection 08/27/2007    Past Surgical History:  Procedure Laterality Date   FEMUR SURGERY     broken bone   HIP SURGERY     SPINAL FIXATION SURGERY     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 Joint pain, if tylenol does not work, Disp: 60 capsule, Rfl: 0   UBRELVY 100 MG TABS, TAKE 1 TABLET BY MOUTH EVERY OTHER DAY AS NEEDED (Patient taking differently: Take 1  tablet by mouth every other day as needed (For migraine).), Disp: 15 tablet, Rfl: 2   cephALEXin (KEFLEX) 500 MG capsule, Take 500 mg by mouth 4 (four) times daily as needed. (Patient not taking: Reported on 07/15/2023), Disp: , Rfl:    metoprolol succinate (TOPROL-XL) 50 MG 24 hr tablet, Take 1 tablet (50 mg total) by mouth daily. Take with or immediately following a meal., Disp: 90 tablet, Rfl: 0   rosuvastatin (CRESTOR) 10 MG tablet, Take 1 tablet (10 mg total) by mouth daily., Disp: 90 tablet, Rfl: 0   Vitamin D, Ergocalciferol, (DRISDOL) 1.25 MG (50000 UNIT) CAPS capsule, Take 1 capsule (50,000 Units total) by mouth every 7 (seven) days., Disp: 12 capsule, Rfl: 1  Allergies  Allergen Reactions    Augmentin [Amoxicillin-Pot Clavulanate]     GI distress    I personally reviewed active problem list, medication list, allergies, family history with the patient/caregiver today.   ROS  Ten systems reviewed and is negative except as mentioned in HPI    Objective  Vitals:   11/12/23 1116 11/12/23 1143  BP: (!) 148/94 132/84  Pulse: 100   Resp: 16   SpO2: 98%   Weight: 159 lb 11.2 oz (72.4 kg)   Height: 4\' 11"  (1.499 m)     Body mass index is 32.26 kg/m.  Physical Exam  Constitutional: Patient appears well-developed and well-nourished. Obese  No distress.  HEENT: head atraumatic, normocephalic, pupils equal and reactive to light, neck supple Cardiovascular: Normal rate, regular rhythm and normal heart sounds.  No murmur heard. No BLE edema. Pulmonary/Chest: Effort normal and breath sounds normal. No respiratory distress. Abdominal: Soft.  There is no tenderness. Muscular skeletal: wide base, wears braces on both lower legs  Psychiatric: Patient has a normal mood and affect. behavior is normal. Judgment and thought content normal.   Recent Results (from the past 2160 hours)  SARS Coronavirus 2 by RT PCR (hospital order, performed in [facility] hospital lab) *cepheid single result test* Anterior Nasal Swab     Status: Abnormal   Collection Time: 09/09/23 10:17 AM   Specimen: Anterior Nasal Swab  Result Value Ref Range   SARS Coronavirus 2 by RT PCR POSITIVE (A) NEGATIVE    Comment: (NOTE) SARS-CoV-2 target nucleic acids are DETECTED  SARS-CoV-2 RNA is generally detectable in upper respiratory specimens  during the acute phase of infection.  Positive results are indicative  of the presence of the identified virus, but do not rule out bacterial infection or co-infection with other pathogens not detected by the test.  Clinical correlation with patient history and  other diagnostic information is necessary to determine patient infection status.  The expected result is  negative.  Fact Sheet for Patients:   RoadLapTop.co.za   Fact Sheet for Healthcare Providers:   http://kim-miller.com/    This test is not yet approved or cleared by the [place] FDA and  has been authorized for detection and/or diagnosis of SARS-CoV-2 by FDA under an Emergency Use Authorization (EUA).  This EUA will remain in effect (meaning this test can be used) for the duration of  the COVID-19 declaration under Section 564(b)(1)  of the Act, 21 U.S.C. section 360-bbb-3(b)(1), unless the authorization is terminated or revoked sooner.   Performed at [facility], [address] [place], [place] [address]     Diabetic Foot Exam:     PHQ2/9:    09/29/2023    1:06 PM 07/15/2023   11:17 AM 04/14/2023  10:47 AM 11/12/2022    9:09 AM 08/07/2022    9:53 AM  Depression screen PHQ 2/9  Decreased Interest 0 0 0 0 0  Down, Depressed, Hopeless 0 0 0 0 0  PHQ - 2 Score 0 0 0 0 0  Altered sleeping  0 0 0 0  Tired, decreased energy  0 0 0 0  Change in appetite  0 0 0 0  Feeling bad or failure about yourself   0 0 0 0  Trouble concentrating  0 0 0 1  Moving slowly or fidgety/restless  0 0 0 0  Suicidal thoughts  0 0 0 0  PHQ-9 Score  0 0 0 1  Difficult doing work/chores     Not difficult at all    phq 9 is negative  Fall Risk:    09/29/2023    1:06 PM 07/15/2023   11:17 AM 04/14/2023   10:47 AM 11/12/2022    9:09 AM 08/07/2022    9:52 AM  Fall Risk   Falls in the past year? 0 0 1 0 1  Number falls in past yr: 0  0 0 0  Injury with Fall? 0  1 0 0  Risk for fall due to : No Fall Risks No Fall Risks History of fall(s);Impaired balance/gait No Fall Risks History of fall(s)  Follow up Falls prevention discussed;Education provided;Falls evaluation completed Falls prevention discussed Falls prevention discussed;Education provided;Falls evaluation completed Falls prevention discussed Education provided;Falls prevention  discussed     Assessment & Plan  1. Hypertension, benign (Primary)  Stop lisinopril hydrochlorothiazide  since he has not been taking medication in over 6 weeks and bp is not very high  - metoprolol succinate (TOPROL-XL) 50 MG 24 hr tablet; Take 1 tablet (50 mg total) by mouth daily. Take with or immediately following a meal.  Dispense: 90 tablet; Refill: 0 - CBC with Differential/Platelet - COMPLETE METABOLIC PANEL WITH GFR  2. 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 Migraine without aura and without status migrainosus, not intractable  - metoprolol succinate (TOPROL-XL) 50 MG 24 hr tablet; Take 1 tablet (50 mg total) by mouth daily. Take with or immediately following a meal.  Dispense: 90 tablet; Refill: 0  4. Familial hyperlipidemia  - rosuvastatin (CRESTOR) 10 MG tablet; Take 1 tablet (10 mg total) by mouth daily.  Dispense: 90 tablet; Refill: 0 - Lipid panel  5. Gait disturbance  stable  6. Vitamin D deficiency  - Vitamin D, Ergocalciferol, (DRISDOL) 1.25 MG (50000 UNIT) CAPS capsule; Take 1 capsule (50,000 Units total) by mouth every 7 (seven) days.  Dispense: 12 capsule; Refill: 1 - VITAMIN D 25 Hydroxy (Vit-D Deficiency, Fractures)  7. Neurogenic bladder  Under the care of urologist  8. Hyperglycemia  - Hemoglobin A1c

## 2023-11-13 ENCOUNTER — Encounter: Payer: Self-pay | Admitting: Family Medicine

## 2023-11-13 LAB — COMPLETE METABOLIC PANEL WITH GFR
AG Ratio: 1.7 (calc) (ref 1.0–2.5)
ALT: 13 U/L (ref 9–46)
AST: 14 U/L (ref 10–40)
Albumin: 4.8 g/dL (ref 3.6–5.1)
Alkaline phosphatase (APISO): 89 U/L (ref 36–130)
BUN: 18 mg/dL (ref 7–25)
CO2: 29 mmol/L (ref 20–32)
Calcium: 9.8 mg/dL (ref 8.6–10.3)
Chloride: 105 mmol/L (ref 98–110)
Creat: 0.96 mg/dL (ref 0.60–1.29)
Globulin: 2.8 g/dL (ref 1.9–3.7)
Glucose, Bld: 89 mg/dL (ref 65–99)
Potassium: 4.2 mmol/L (ref 3.5–5.3)
Sodium: 141 mmol/L (ref 135–146)
Total Bilirubin: 0.6 mg/dL (ref 0.2–1.2)
Total Protein: 7.6 g/dL (ref 6.1–8.1)
eGFR: 98 mL/min/{1.73_m2} (ref >=60)

## 2023-11-13 LAB — LIPID PANEL
Cholesterol: 260 mg/dL — ABNORMAL HIGH (ref <200)
HDL: 78 mg/dL (ref >=40)
LDL Cholesterol (Calc): 167 mg/dL — ABNORMAL HIGH
Non-HDL Cholesterol (Calc): 182 mg/dL — ABNORMAL HIGH (ref <130)
Total CHOL/HDL Ratio: 3.3 (calc) (ref <5.0)
Triglycerides: 59 mg/dL (ref <150)

## 2023-11-13 LAB — CBC WITH DIFFERENTIAL/PLATELET
Absolute Lymphocytes: 1539 {cells}/uL (ref 850–3900)
Absolute Monocytes: 491 {cells}/uL (ref 200–950)
Basophils Absolute: 32 {cells}/uL (ref 0–200)
Basophils Relative: 0.6 %
Eosinophils Absolute: 59 {cells}/uL (ref 15–500)
Eosinophils Relative: 1.1 %
HCT: 50.1 % — ABNORMAL HIGH (ref 38.5–50.0)
Hemoglobin: 16.5 g/dL (ref 13.2–17.1)
MCH: 29.1 pg (ref 27.0–33.0)
MCHC: 32.9 g/dL (ref 32.0–36.0)
MCV: 88.4 fL (ref 80.0–100.0)
MPV: 10.4 fL (ref 7.5–12.5)
Monocytes Relative: 9.1 %
Neutro Abs: 3278 {cells}/uL (ref 1500–7800)
Neutrophils Relative %: 60.7 %
Platelets: 283 10*3/uL (ref 140–400)
RBC: 5.67 10*6/uL (ref 4.20–5.80)
RDW: 12.3 % (ref 11.0–15.0)
Total Lymphocyte: 28.5 %
WBC: 5.4 10*3/uL (ref 3.8–10.8)

## 2023-11-13 LAB — HEMOGLOBIN A1C
Hgb A1c MFr Bld: 5.4 %{Hb} (ref <5.7)
Mean Plasma Glucose: 108 mg/dL
eAG (mmol/L): 6 mmol/L

## 2023-11-13 LAB — VITAMIN D 25 HYDROXY (VIT D DEFICIENCY, FRACTURES): Vit D, 25-Hydroxy: 61 ng/mL (ref 30–100)

## 2023-11-16 ENCOUNTER — Ambulatory Visit: Payer: Self-pay | Admitting: Family Medicine

## 2023-11-16 NOTE — Telephone Encounter (Signed)
 Copied from CRM [id number]. Topic: Clinical - Lab/Test Results >> Nov 16, 2023  9:16 AM Macon Large wrote: Reason for CRM: Patient requests a return call to go over his lab results. Patient stated he requested to be contacted by phone to discuss and explain the lab results. Call back# [phone]  Chief Complaint: PCP Call-No Triage   Disposition: [] ED /[] Urgent Care (no appt availability in office) / [] Appointment(In office/virtual)/ []  [facility]/ [] Home Care/ [] Refused Recommended Disposition /[] [facility]/ [x]  Follow-up with PCP Additional Notes: TS was contacted regarding an inquiry on his lab results and information regarding them. Reviewed lab results with patients with the patient and answered questions the patient had thereafter.      Reason for Disposition  Caller requesting lab results  (Exception: Routine or non-urgent lab result.)  Protocols used: PCP Call - No Triage-A-AH

## 2023-11-16 NOTE — Telephone Encounter (Signed)
 Pt mom notified   Lipid panel improved  Normal hemoglobin Sugar, kidney and liver function tests are within normal limits  Normal vitamin D , continue supplementation  Normal A1C

## 2023-11-16 NOTE — Telephone Encounter (Signed)
 Called pt back was not at home spoke to patient mom, and advised to let him know to call us back if patient had any questions.

## 2024-01-07 DIAGNOSIS — R3914 Feeling of incomplete bladder emptying: Secondary | ICD-10-CM | POA: Diagnosis not present

## 2024-01-07 DIAGNOSIS — N3021 Other chronic cystitis with hematuria: Secondary | ICD-10-CM | POA: Diagnosis not present

## 2024-01-08 DIAGNOSIS — N3021 Other chronic cystitis with hematuria: Secondary | ICD-10-CM | POA: Diagnosis not present

## 2024-01-17 ENCOUNTER — Other Ambulatory Visit: Payer: Self-pay | Admitting: Family Medicine

## 2024-01-17 DIAGNOSIS — E7849 Other hyperlipidemia: Secondary | ICD-10-CM

## 2024-03-09 ENCOUNTER — Other Ambulatory Visit: Payer: Self-pay | Admitting: Family Medicine

## 2024-03-09 DIAGNOSIS — E559 Vitamin D deficiency, unspecified: Secondary | ICD-10-CM

## 2024-03-09 NOTE — Telephone Encounter (Unsigned)
 Copied from CRM [id number]. Topic: Clinical - Medication Refill >> Mar 09, 2024  4:13 PM Donee H wrote: Medication: Vitamin D , Ergocalciferol , (DRISDOL ) 1.25 MG (50000 UNIT)  UBRELVY  100 MG TABS   Has the patient contacted their pharmacy? Yes, patient was advised to reach out to provider regarding refill. Patient  also stated Vitamin D  cost went up and would like to know is it a way to have the cost go down    (Agent: If no, request that the patient contact the pharmacy for the refill. If patient does not wish to contact the pharmacy document the reason why and proceed with request.) (Agent: If yes, when and what did the pharmacy advise?)  This is the patient's preferred pharmacy:  [id number] [place], [place] - [address] AT [facility] OF [address] [place] [address] Phone: [phone] Fax: [phone]    Is this the correct pharmacy for this prescription? Yes If no, delete pharmacy and type the correct one.   Has the prescription been filled recently? No  Is the patient out of the medication? Yes, patient states out and need medication.   Has the patient been seen for an appointment in the last year OR does the patient have an upcoming appointment? Yes  Can we respond through MyChart? Yes  Agent: Please be advised that Rx refills may take up to 3 business days. We ask that you follow-up with your pharmacy.

## 2024-03-14 ENCOUNTER — Other Ambulatory Visit: Payer: Self-pay | Admitting: Family Medicine

## 2024-03-14 DIAGNOSIS — E7849 Other hyperlipidemia: Secondary | ICD-10-CM

## 2024-03-14 NOTE — Telephone Encounter (Signed)
 Requested medication (s) are due for refill today: yes  Requested medication (s) are on the active medication list: yes  Last refill:  11/12/23  Future visit scheduled: yes  Notes to clinic:  Unable to refill per protocol, cannot delegate.      Requested Prescriptions  Pending Prescriptions Disp Refills   Vitamin D , Ergocalciferol , (DRISDOL ) 1.25 MG (50000 UNIT) CAPS capsule 12 capsule 1    Sig: Take 1 capsule (50,000 Units total) by mouth every 7 (seven) days.     Endocrinology:  Vitamins - Vitamin D  Supplementation 2 Failed - 03/14/2024  8:29 AM      Failed - Manual Review: Route requests for 50,000 IU strength to the provider      Passed - Ca in normal range and within 360 days    Calcium   Date Value Ref Range Status  11/12/2023 9.8 8.6 - 10.3 mg/dL Final         Passed - Vitamin D  in normal range and within 360 days    Vit D, 25-Hydroxy  Date Value Ref Range Status  11/12/2023 61 30 - 100 ng/mL Final    Comment:    Vitamin D  Status         25-OH Vitamin D : . Deficiency:                    <20 ng/mL Insufficiency:             20 - 29 ng/mL Optimal:                 > or = 30 ng/mL . For 25-OH Vitamin D  testing on patients on  D2-supplementation and patients for whom quantitation  of D2 and D3 fractions is required, the QuestAssureD(TM) 25-OH VIT D, (D2,D3), LC/MS/MS is recommended: order  code 07111 (patients >49yrs). . See Note 1 . Note 1 . For additional information, please refer to  http://education.[facility].com/faq/FAQ199  (This link is being provided for informational/ educational purposes only.)          Passed - Valid encounter within last 12 months    Recent Outpatient Visits           4 months ago Hypertension, benign   [facility] [place] Mire, MD       Future Appointments             Tomorrow Sowles, Krichna, MD [facility]             Ubrogepant  (UBRELVY ) 100 MG  TABS 15 tablet 2    Sig: Take 1 tablet (100 mg total) by mouth every other day as needed.     Off-Protocol Failed - 03/14/2024  8:29 AM      Failed - Medication not assigned to a protocol, review manually.      Passed - Valid encounter within last 12 months    Recent Outpatient Visits           4 months ago Hypertension, benign   [facility] Glenard Mire, MD       Future Appointments             Tomorrow Sowles, Krichna, MD [facility], [facility]

## 2024-03-14 NOTE — Telephone Encounter (Signed)
 Requested medication (s) are due for refill today: yes  Requested medication (s) are on the active medication list: yes  Last refill:  multiple dates  Future visit scheduled: yes  Notes to clinic:  Medication not assigned to a protocol, review manually.      Requested Prescriptions  Pending Prescriptions Disp Refills   Vitamin D , Ergocalciferol , (DRISDOL ) 1.25 MG (50000 UNIT) CAPS capsule 12 capsule 1    Sig: Take 1 capsule (50,000 Units total) by mouth every 7 (seven) days.     Endocrinology:  Vitamins - Vitamin D  Supplementation 2 Failed - 03/14/2024  8:19 AM      Failed - Manual Review: Route requests for 50,000 IU strength to the provider      Passed - Ca in normal range and within 360 days    Calcium   Date Value Ref Range Status  11/12/2023 9.8 8.6 - 10.3 mg/dL Final         Passed - Vitamin D  in normal range and within 360 days    Vit D, 25-Hydroxy  Date Value Ref Range Status  11/12/2023 61 30 - 100 ng/mL Final    Comment:    Vitamin D  Status         25-OH Vitamin D : . Deficiency:                    <20 ng/mL Insufficiency:             20 - 29 ng/mL Optimal:                 > or = 30 ng/mL . For 25-OH Vitamin D  testing on patients on  D2-supplementation and patients for whom quantitation  of D2 and D3 fractions is required, the QuestAssureD(TM) 25-OH VIT D, (D2,D3), LC/MS/MS is recommended: order  code 07111 (patients >54yrs). . See Note 1 . Note 1 . For additional information, please refer to  http://education.[facility].com/faq/FAQ199  (This link is being provided for informational/ educational purposes only.)          Passed - Valid encounter within last 12 months    Recent Outpatient Visits           4 months ago Hypertension, benign   [facility] Glenard Mire, MD       Future Appointments             Tomorrow Sowles, Krichna, MD [facility]             Ubrogepant   (UBRELVY ) 100 MG TABS 15 tablet 2    Sig: Take 1 tablet (100 mg total) by mouth every other day as needed.     Off-Protocol Failed - 03/14/2024  8:19 AM      Failed - Medication not assigned to a protocol, review manually.      Passed - Valid encounter within last 12 months    Recent Outpatient Visits           4 months ago Hypertension, benign   [facility] [place]ard Mire, MD       Future Appointments             Tomorrow Sowles, Krichna, MD [facility], [facility]

## 2024-03-15 ENCOUNTER — Encounter: Payer: Self-pay | Admitting: Family Medicine

## 2024-03-15 ENCOUNTER — Ambulatory Visit (INDEPENDENT_AMBULATORY_CARE_PROVIDER_SITE_OTHER): Admitting: Family Medicine

## 2024-03-15 VITALS — BP 136/84 | HR 98 | Resp 16 | Ht 59.0 in | Wt 164.6 lb

## 2024-03-15 DIAGNOSIS — J300 Vasomotor rhinitis: Secondary | ICD-10-CM | POA: Diagnosis not present

## 2024-03-15 DIAGNOSIS — Q057 Lumbar spina bifida without hydrocephalus: Secondary | ICD-10-CM

## 2024-03-15 DIAGNOSIS — G43009 Migraine without aura, not intractable, without status migrainosus: Secondary | ICD-10-CM

## 2024-03-15 DIAGNOSIS — E7849 Other hyperlipidemia: Secondary | ICD-10-CM

## 2024-03-15 DIAGNOSIS — Z789 Other specified health status: Secondary | ICD-10-CM

## 2024-03-15 DIAGNOSIS — N390 Urinary tract infection, site not specified: Secondary | ICD-10-CM

## 2024-03-15 DIAGNOSIS — E559 Vitamin D deficiency, unspecified: Secondary | ICD-10-CM | POA: Diagnosis not present

## 2024-03-15 DIAGNOSIS — R269 Unspecified abnormalities of gait and mobility: Secondary | ICD-10-CM | POA: Diagnosis not present

## 2024-03-15 DIAGNOSIS — N319 Neuromuscular dysfunction of bladder, unspecified: Secondary | ICD-10-CM

## 2024-03-15 DIAGNOSIS — I10 Essential (primary) hypertension: Secondary | ICD-10-CM

## 2024-03-15 MED ORDER — IPRATROPIUM BROMIDE 0.03 % NA SOLN: 2.0000 | Freq: Two times a day (BID) | NASAL | 5 refills | Status: DC

## 2024-03-15 MED ORDER — METOPROLOL SUCCINATE ER 50 MG PO TB24: 50.0000 mg | ORAL_TABLET | Freq: Every day | ORAL | 1 refills | Status: DC

## 2024-03-15 MED ORDER — ROSUVASTATIN CALCIUM 10 MG PO TABS: 10.0000 mg | ORAL_TABLET | Freq: Every day | ORAL | 1 refills | Status: DC

## 2024-03-15 MED ORDER — VITAMIN D3 50 MCG (2000 UT) PO CAPS: 2000.0000 [IU] | ORAL_CAPSULE | Freq: Every day | ORAL | Status: AC

## 2024-03-15 MED ORDER — NURTEC 75 MG PO TBDP: 1.0000 | ORAL_TABLET | ORAL | 5 refills | Status: DC

## 2024-03-15 NOTE — Progress Notes (Signed)
 Name: Juan Hodges   MRN: [id number]    DOB: 10/17/1975   Date:03/15/2024       Progress Note  Subjective  Chief Complaint  Chief Complaint  Patient presents with   Medical Management of Chronic Issues   Discussed the use of AI scribe software for clinical note transcription with the patient, who gave verbal consent to proceed.  History of Present Illness Juan Hodges is a 48 year old male who presents for a regular follow-up visit.  He has hypertension and is currently taking metoprolol  50 mg daily. His blood pressure readings have been slightly elevated compared to previous visits, with today's reading at 138/86 mmHg. He previously stopped taking lisinopril  HCTZ as his blood pressure was controlled on metoprolol  alone. He does not monitor his blood pressure at home. No chest pain or palpitations.  He has familial hyperlipidemia and is taking rosuvastatin  daily. His cholesterol level has decreased from 204 mg/dL to 832 mg/dL since March. Initially, he took the medication every other day due to cramping but has since increased to daily use.  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 He takes cephalexin 500 mg as needed for UTIs.   He wears braces due to surgeries on his legs but does not require a cane.  He experiences migraines every other day, with pain localized behind his eye. He has been taking Ubrelvy  and medication eases the pain but does not completely alleviate it. He was previously taking Ubrelvy  for migraines. No light or sound sensitivity.  He has osteoarthritis in his hip, causing pain radiating down his leg. He takes Tylenol for pain management, which provides some relief. He has a history of a femur fracture due to a fall, with surgical repair involving screws still in place.  He experiences vasomotor rhinitis, with congestion occurring when he eats. No runny nose, just  congestion.  He was previously taking prescription vitamin D  but has switched to over-the-counter vitamin D  due to cost concerns.    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 Joint pain, if tylenol does not work, Disp: 60 capsule, Rfl: 0  cephALEXin (KEFLEX) 500 MG capsule, Take 500 mg by mouth 4 (four) times daily as needed., Disp: , Rfl:    Cholecalciferol (VITAMIN D3) 50 MCG (2000 UT) capsule, Take 1 capsule (2,000 Units total) by mouth daily., Disp: , Rfl:    ipratropium (ATROVENT ) 0.03 % nasal spray, Place 2 sprays into both nostrils every 12 (twelve) hours., Disp: 30 mL, Rfl: 5   Rimegepant Sulfate (NURTEC) 75 MG TBDP, Take 1 tablet (75 mg total) by mouth every other day., Disp: 15 tablet, Rfl: 5   metoprolol  succinate (TOPROL -XL) 50 MG 24  hr tablet, Take 1 tablet (50 mg total) by mouth daily. Take with or immediately following a meal., Disp: 90 tablet, Rfl: 1   rosuvastatin  (CRESTOR ) 10 MG tablet, Take 1 tablet (10 mg total) by mouth daily., Disp: 90 tablet, Rfl: 1  Allergies  Allergen Reactions   Augmentin  [Amoxicillin -Pot Clavulanate]     GI distress    I personally reviewed active problem list, medication list, allergies, family history with the patient/caregiver today.   ROS  Ten systems reviewed and is negative except as mentioned in HPI    Objective Physical Exam VITALS: BP- 138/86 CONSTITUTIONAL: Patient appears well-developed and well-nourished.  No distress. HEENT: Head atraumatic, normocephalic, neck supple. CARDIOVASCULAR: Normal rate, regular rhythm and normal heart sounds.  No murmur heard. No BLE edema. PULMONARY: Effort normal and breath sounds normal. No respiratory distress. ABDOMINAL: There is no tenderness or distention. MUSCULOSKELETAL: slow gait, wears lower extremity braces PSYCHIATRIC: Patient has a normal mood and affect. behavior is normal. Judgment and thought content normal.  Vitals:   03/15/24 1552 03/15/24 1627  BP: 138/86 136/84  Pulse: 98   Resp: 16   SpO2: 94%   Weight: 164 lb 9.6 oz (74.7 kg)   Height: 4' 11 (1.499 m)     Body mass index is 33.25 kg/m.   PHQ2/9:    03/15/2024    3:45 PM 09/29/2023    1:06 PM 07/15/2023   11:17 AM 04/14/2023   10:47 AM 11/12/2022    9:09 AM  Depression screen PHQ 2/9  Decreased Interest 0 0 0 0 0  Down, Depressed, Hopeless 0 0 0 0 0  PHQ - 2 Score 0 0 0 0 0  Altered sleeping   0 0 0  Tired, decreased energy   0 0 0  Change in appetite   0 0 0  Feeling bad or failure about yourself    0 0 0  Trouble concentrating   0 0 0  Moving slowly or fidgety/restless   0 0 0  Suicidal thoughts   0 0 0  PHQ-9 Score   0 0 0    phq 9 is negative  Fall Risk:    03/15/2024    3:45 PM 09/29/2023    1:06 PM 07/15/2023   11:17 AM 04/14/2023   10:47  AM 11/12/2022    9:09 AM  Fall Risk   Falls in the past year? 0 0 0 1 0  Number falls in past yr: 0 0  0 0  Injury with Fall? 0 0  1 0  Risk for fall due to : No Fall Risks No Fall Risks No Fall Risks History of fall(s);Impaired balance/gait No Fall Risks  Follow up Falls evaluation completed Falls prevention discussed;Education provided;Falls evaluation completed Falls prevention discussed Falls prevention discussed;Education provided;Falls evaluation completed Falls prevention discussed      Assessment & Plan Hypertension Blood pressure slightly elevated at 138/86 mmHg.  Controlled on metoprolol  50 mg daily. - Recheck blood pressure before leaving the office. - Continue metoprolol  50 mg daily. - return in 2 weeks for BP recheck if BP remains above 130/80 we will add lisinopril  10 mg  - Encourage home blood pressure monitoring.  Familial hypercholesterolemia Cholesterol decreased to 167 mg/dL on daily rosuvastatin . Levels not optimal. - Continue rosuvastatin  daily. - Recheck cholesterol levels at next visit.  Migraine Migraines every other day, pain behind eye. Ubrelvy  not fully effective. - Prescribe Nurtec every other day for prevention. - Discontinue Ubrelvy .  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 - Continue cephalexin 500 mg as needed for UTI symptoms. - Continue follow-up with urologist.  Osteoarthritis of the hip, post-traumatic Post-traumatic osteoarthritis with leg pain, managed with Tylenol. No desire for further surgery. - Continue Tylenol for pain management.  Vasomotor rhinitis Nasal congestion when eating, diagnosed as vasomotor rhinitis. - Prescribe Atrovent  nasal spray, use two squirts before meals.  Vitamin D  deficiency Switching to over-the-counter vitamin D  due to cost. - Switch to over-the-counter vitamin D  2000 IU daily.

## 2024-03-31 ENCOUNTER — Ambulatory Visit

## 2024-03-31 VITALS — BP 136/86

## 2024-03-31 DIAGNOSIS — Z0130 Encounter for examination of blood pressure without abnormal findings: Secondary | ICD-10-CM

## 2024-03-31 DIAGNOSIS — I10 Essential (primary) hypertension: Secondary | ICD-10-CM

## 2024-03-31 NOTE — Progress Notes (Signed)
 Patient is in office today for a nurse visit for Blood Pressure Check. Patient blood pressure was 140//86, Patient No chest pain, No shortness of breath, No dyspnea on exertion  136/86 after rest

## 2024-05-10 DIAGNOSIS — M12551 Traumatic arthropathy, right hip: Secondary | ICD-10-CM | POA: Diagnosis not present

## 2024-05-27 ENCOUNTER — Telehealth: Payer: Self-pay | Admitting: Family Medicine

## 2024-05-27 NOTE — Telephone Encounter (Signed)
 Pt is asking for a refill on Nurtec. I did tell him that he should have refills at [facility]. He is going to check there and give us  a call if they do not have it on file.

## 2024-07-15 ENCOUNTER — Other Ambulatory Visit: Payer: Self-pay | Admitting: Family Medicine

## 2024-07-15 DIAGNOSIS — E7849 Other hyperlipidemia: Secondary | ICD-10-CM

## 2024-09-16 ENCOUNTER — Encounter: Payer: Self-pay | Admitting: Family Medicine

## 2024-09-16 ENCOUNTER — Ambulatory Visit (INDEPENDENT_AMBULATORY_CARE_PROVIDER_SITE_OTHER): Admitting: Family Medicine

## 2024-09-16 VITALS — BP 124/80 | HR 102 | Resp 16 | Ht 59.0 in | Wt 168.6 lb

## 2024-09-16 DIAGNOSIS — I10 Essential (primary) hypertension: Secondary | ICD-10-CM

## 2024-09-16 DIAGNOSIS — Z789 Other specified health status: Secondary | ICD-10-CM | POA: Diagnosis not present

## 2024-09-16 DIAGNOSIS — Q057 Lumbar spina bifida without hydrocephalus: Secondary | ICD-10-CM | POA: Diagnosis not present

## 2024-09-16 DIAGNOSIS — E7849 Other hyperlipidemia: Secondary | ICD-10-CM | POA: Diagnosis not present

## 2024-09-16 DIAGNOSIS — R739 Hyperglycemia, unspecified: Secondary | ICD-10-CM | POA: Diagnosis not present

## 2024-09-16 DIAGNOSIS — E559 Vitamin D deficiency, unspecified: Secondary | ICD-10-CM

## 2024-09-16 DIAGNOSIS — N319 Neuromuscular dysfunction of bladder, unspecified: Secondary | ICD-10-CM

## 2024-09-16 DIAGNOSIS — G43009 Migraine without aura, not intractable, without status migrainosus: Secondary | ICD-10-CM | POA: Diagnosis not present

## 2024-09-16 DIAGNOSIS — R269 Unspecified abnormalities of gait and mobility: Secondary | ICD-10-CM

## 2024-09-16 DIAGNOSIS — N390 Urinary tract infection, site not specified: Secondary | ICD-10-CM

## 2024-09-16 DIAGNOSIS — J300 Vasomotor rhinitis: Secondary | ICD-10-CM | POA: Diagnosis not present

## 2024-09-16 MED ORDER — NURTEC 75 MG PO TBDP: 1.0000 | ORAL_TABLET | ORAL | 5 refills | Status: AC

## 2024-09-16 MED ORDER — FLUTICASONE PROPIONATE 50 MCG/ACT NA SUSP: 2.0000 | Freq: Every day | NASAL | 1 refills | Status: AC

## 2024-09-16 MED ORDER — ROSUVASTATIN CALCIUM 10 MG PO TABS: 10.0000 mg | ORAL_TABLET | Freq: Every day | ORAL | 1 refills | Status: AC

## 2024-09-16 MED ORDER — METOPROLOL SUCCINATE ER 50 MG PO TB24: 50.0000 mg | ORAL_TABLET | Freq: Every day | ORAL | 1 refills | Status: AC

## 2024-09-16 MED ORDER — IPRATROPIUM BROMIDE 0.03 % NA SOLN: 2.0000 | Freq: Two times a day (BID) | NASAL | 1 refills | Status: AC

## 2024-09-16 NOTE — Progress Notes (Signed)
 Name: Juan Hodges   MRN: [id number]    DOB: [birth year]   Date:09/16/2024       Progress Note  Subjective  Chief Complaint  Chief Complaint  Patient presents with   Medical Management of Chronic Issues   Discussed the use of AI scribe software for clinical note transcription with the patient, who gave verbal consent to proceed.  History of Present Illness Juan Hodges is a 49 year old male with migraines who presents for a regular follow-up visit.  He has been experiencing a migraine since last Wednesday, persisting for over a week. The pain is sharp, located behind his right eye, and worsens when lying down but improves when sitting up. Light sensitivity is present, but there is no associated vomiting. He rates the current pain level as 4 out of 10, though it was more intense a few days ago. He typically experiences migraines without aura and takes Nurtec as needed, which helps but does not completely alleviate the pain. He attempted to refill his Nurtec prescription yesterday but encountered issues with insurance approval. The pharmacy indicated they would resolve the issue and fill the prescription today, but he has not received confirmation.  He is currently taking metoprolol  XL 50 mg daily. He has not been monitoring his blood pressure or heart rate at home. No palpitations or chest pain.  He has a history of hypercholesterolemia and is taking rosuvastatin  daily. His LDL cholesterol was previously as high as 204 mg/dL and decreased to 832 mg/dL with medication adherence. He reports past issues with muscle cramping when not taking the medication consistently, but he is now taking it daily without problems.  He has a history of spina bifida of the lumbar spine, leading to neurogenic bladder and gait issues. He performs self-catheterization and uses braces. He takes Keflex 500 mg as needed for urinary tract infections.  He experiences vasomotor rhinitis, particularly when eating, and  finds Flonase  more effective than Atrovent  nasal spray for symptom relief. He uses Flonase  regularly.    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   ROS  Ten systems reviewed and is negative except as mentioned in HPI    Objective Physical Exam VITALS: P- 102 CONSTITUTIONAL: Patient  appears well-developed and well-nourished. No distress. HEENT: Head atraumatic, normocephalic, neck supple. CARDIOVASCULAR: Normal rate, regular rhythm and normal heart sounds. No murmur heard. No lower extremity edema. PULMONARY: Effort normal and breath sounds normal. No respiratory distress. ABDOMINAL: There is no tenderness or distention. MUSCULOSKELETAL: Normal gait. Without gross motor or sensory deficit. PSYCHIATRIC: Patient has a  normal mood and affect. Behavior is normal. Judgment and thought content normal.  Vitals:   09/16/24 1434 09/16/24 1442  BP: 124/80   Pulse: (!) 114 (!) 102  Resp: 16   SpO2: 96%   Weight: 168 lb 9.6 oz (76.5 kg)   Height: 4' 11 (1.499 m)     Body mass index is 34.05 kg/m.   PHQ2/9:    09/16/2024    2:34 PM 03/15/2024    3:45 PM 09/29/2023    1:06 PM 07/15/2023   11:17 AM 04/14/2023   10:47 AM  Depression screen PHQ 2/9  Decreased Interest 0 0 0 0 0  Down, Depressed, Hopeless 0 0 0 0 0  PHQ - 2 Score 0 0 0 0 0  Altered sleeping    0 0  Tired, decreased energy    0 0  Change in appetite    0 0  Feeling bad or failure about yourself     0 0  Trouble concentrating    0 0  Moving slowly or fidgety/restless    0 0  Suicidal thoughts    0 0  PHQ-9 Score    0  0      Data saved with a previous flowsheet row definition    phq 9 is negative  Fall Risk:    09/16/2024    2:34 PM 03/15/2024    3:45 PM 09/29/2023    1:06 PM 07/15/2023   11:17 AM 04/14/2023   10:47 AM  Fall Risk   Falls in the past year? 0 0 0 0 1  Number falls in past yr: 0 0 0  0  Injury with Fall? 0 0  0   1   Risk for fall due to : No Fall Risks No Fall Risks No Fall Risks No Fall Risks History of fall(s);Impaired balance/gait  Follow up Falls evaluation completed Falls evaluation completed Falls prevention discussed;Education provided;Falls evaluation completed Falls prevention discussed Falls prevention discussed;Education provided;Falls evaluation completed     Data saved with a previous flowsheet row definition      Assessment & Plan Migraine without aura Chronic migraines managed with Nurtec, partial relief achieved. - Sent prescription for Nurtec to pharmacy. - Advised taking Nurtec every other day if migraine persists. - Allowed concurrent use of Nurtec with Aleve and Tylenol.  Essential hypertension Blood pressure controlled with metoprolol  XL 50 mg daily, heart rate improved. - Continue  metoprolol  XL 50 mg daily.  Familial hyperlipidemia Hypercholesterolemia managed with rosuvastatin , LDL reduced. - Continue rosuvastatin  daily. - Ordered blood work to assess cholesterol levels.  Spina bifida of lumbar spine with neurogenic bladder and gait disturbance Chronic condition managed with self-catheterization and braces. - Continue current management with self-catheterization and braces.  Recurrent urinary tract infection Recurrent UTIs managed with Keflex as needed. - Continue Keflex as needed for UTI symptoms.  Vasomotor rhinitis Chronic nasal congestion managed with Flonase , better relief than Atrovent . - Prescribed Flonase  nasal spray. - Prescribed Atrovent  nasal spray for use before meals.  Vitamin D  deficiency Vitamin D  supplementation recommended. - Recommended over-the-counter vitamin D  supplementation daily.  General health maintenance Routine health maintenance discussed,  vaccinations and screenings needed. - Advised obtaining tetanus and flu vaccinations at a pharmacy. - Recommended completing colon cancer screening with a kit or colonoscopy.        [1]  Current Outpatient Medications:    cephALEXin (KEFLEX) 500 MG capsule, Take 500 mg by mouth 4 (four) times daily as needed., Disp: , Rfl:    Cholecalciferol (VITAMIN D3) 50 MCG (2000 UT) capsule, Take 1 capsule (2,000 Units total) by mouth daily., Disp: , Rfl:    ipratropium (ATROVENT ) 0.03 % nasal spray, Place 2 sprays into both nostrils every 12 (twelve) hours., Disp: 30 mL, Rfl: 5   metoprolol  succinate (TOPROL -XL) 50 MG 24 hr tablet, Take 1 tablet (50 mg total) by mouth daily. Take with or immediately following a meal., Disp: 90 tablet, Rfl: 1   Rimegepant Sulfate (NURTEC) 75 MG TBDP, Take 1 tablet (75 mg total) by mouth every other day., Disp: 15 tablet, Rfl: 5   rosuvastatin  (CRESTOR ) 10 MG tablet, Take 1 tablet (10 mg total) by mouth daily., Disp: 90 tablet, Rfl: 1 [2]  Allergies Allergen  Reactions   Augmentin  [Amoxicillin -Pot Clavulanate]     GI distress

## 2024-09-20 ENCOUNTER — Telehealth: Payer: Self-pay

## 2024-09-20 NOTE — Telephone Encounter (Signed)
 Lvm for pt to call back and schedule an appt.

## 2024-09-20 NOTE — Telephone Encounter (Signed)
 Copied from CRM [id number]. Topic: Clinical - Medical Advice >> Sep 20, 2024 11:54 AM Myrick T wrote: Reason for CRM: patient called stated he had a high pulse and want to know if it causes headaches. He said his numbers are running between [phone]. Please f/u with patient at [phone].

## 2024-10-08 ENCOUNTER — Ambulatory Visit: Payer: Self-pay | Admitting: Family Medicine

## 2024-10-08 LAB — CBC WITH DIFFERENTIAL/PLATELET
Absolute Lymphocytes: 1639 {cells}/uL (ref 850–3900)
Absolute Monocytes: 632 {cells}/uL (ref 200–950)
Basophils Absolute: 27 {cells}/uL (ref 0–200)
Basophils Relative: 0.4 %
Eosinophils Absolute: 211 {cells}/uL (ref 15–500)
Eosinophils Relative: 3.1 %
HCT: 50.1 % (ref 39.4–51.1)
Hemoglobin: 17.2 g/dL — ABNORMAL HIGH (ref 13.2–17.1)
MCH: 30 pg (ref 27.0–33.0)
MCHC: 34.3 g/dL (ref 31.6–35.4)
MCV: 87.3 fL (ref 81.4–101.7)
MPV: 10.4 fL (ref 7.5–12.5)
Monocytes Relative: 9.3 %
Neutro Abs: 4291 {cells}/uL (ref 1500–7800)
Neutrophils Relative %: 63.1 %
Platelets: 271 10*3/uL (ref 140–400)
RBC: 5.74 Million/uL (ref 4.20–5.80)
RDW: 12.2 % (ref 11.0–15.0)
Total Lymphocyte: 24.1 %
WBC: 6.8 10*3/uL (ref 3.8–10.8)

## 2024-10-08 LAB — COMPREHENSIVE METABOLIC PANEL WITH GFR
AG Ratio: 1.4 (calc) (ref 1.0–2.5)
ALT: 18 U/L (ref 9–46)
AST: 15 U/L (ref 10–40)
Albumin: 4.5 g/dL (ref 3.6–5.1)
Alkaline phosphatase (APISO): 112 U/L (ref 36–130)
BUN: 13 mg/dL (ref 7–25)
CO2: 31 mmol/L (ref 20–32)
Calcium: 10.1 mg/dL (ref 8.6–10.3)
Chloride: 104 mmol/L (ref 98–110)
Creat: 1.01 mg/dL (ref 0.60–1.29)
Globulin: 3.2 g/dL (ref 1.9–3.7)
Glucose, Bld: 93 mg/dL (ref 65–99)
Potassium: 4.3 mmol/L (ref 3.5–5.3)
Sodium: 142 mmol/L (ref 135–146)
Total Bilirubin: 0.6 mg/dL (ref 0.2–1.2)
Total Protein: 7.7 g/dL (ref 6.1–8.1)
eGFR: 91 mL/min/{1.73_m2}

## 2024-10-08 LAB — LIPID PANEL
Cholesterol: 169 mg/dL
HDL: 73 mg/dL
LDL Cholesterol (Calc): 82 mg/dL
Non-HDL Cholesterol (Calc): 96 mg/dL
Total CHOL/HDL Ratio: 2.3 (calc)
Triglycerides: 65 mg/dL

## 2024-10-08 LAB — VITAMIN D 25 HYDROXY (VIT D DEFICIENCY, FRACTURES): Vit D, 25-Hydroxy: 38 ng/mL (ref 30–100)

## 2024-10-08 LAB — HEMOGLOBIN A1C
Hgb A1c MFr Bld: 5.4 %
Mean Plasma Glucose: 108 mg/dL
eAG (mmol/L): 6 mmol/L

## 2024-11-18 ENCOUNTER — Ambulatory Visit

## 2025-03-17 ENCOUNTER — Ambulatory Visit: Admitting: Family Medicine
